# Patient Record
Sex: Male | Born: 1935 | Race: White | Hispanic: No | Marital: Single | Smoking: Former smoker
Health system: Southern US, Community
[De-identification: ages and names within clinical notes are randomized; demographics above are authoritative.]

## PROBLEM LIST (undated history)

## (undated) DIAGNOSIS — K591 Functional diarrhea: Secondary | ICD-10-CM

## (undated) DIAGNOSIS — I739 Peripheral vascular disease, unspecified: Secondary | ICD-10-CM

## (undated) DIAGNOSIS — E785 Hyperlipidemia, unspecified: Secondary | ICD-10-CM

## (undated) DIAGNOSIS — B070 Plantar wart: Secondary | ICD-10-CM

## (undated) DIAGNOSIS — F0390 Unspecified dementia without behavioral disturbance: Secondary | ICD-10-CM

## (undated) DIAGNOSIS — D649 Anemia, unspecified: Secondary | ICD-10-CM

## (undated) DIAGNOSIS — R51 Headache: Secondary | ICD-10-CM

## (undated) DIAGNOSIS — I714 Abdominal aortic aneurysm, without rupture, unspecified: Secondary | ICD-10-CM

## (undated) DIAGNOSIS — K649 Unspecified hemorrhoids: Secondary | ICD-10-CM

## (undated) DIAGNOSIS — I10 Essential (primary) hypertension: Secondary | ICD-10-CM

## (undated) DIAGNOSIS — R413 Other amnesia: Secondary | ICD-10-CM

## (undated) DIAGNOSIS — G629 Polyneuropathy, unspecified: Secondary | ICD-10-CM

## (undated) DIAGNOSIS — R1900 Intra-abdominal and pelvic swelling, mass and lump, unspecified site: Secondary | ICD-10-CM

## (undated) HISTORY — DX: Peripheral vascular disease, unspecified: I73.9

## (undated) HISTORY — DX: Hyperlipidemia, unspecified: E78.5

## (undated) HISTORY — DX: Plantar wart: B07.0

## (undated) HISTORY — DX: Polyneuropathy, unspecified: G62.9

## (undated) HISTORY — DX: Functional diarrhea: K59.1

## (undated) HISTORY — DX: Abdominal aortic aneurysm, without rupture, unspecified: I71.40

## (undated) HISTORY — PX: EYE SURGERY: SHX253

## (undated) HISTORY — DX: Abdominal aortic aneurysm, without rupture: I71.4

## (undated) HISTORY — DX: Anemia, unspecified: D64.9

## (undated) HISTORY — DX: Intra-abdominal and pelvic swelling, mass and lump, unspecified site: R19.00

## (undated) HISTORY — PX: CATARACT EXTRACTION W/ INTRAOCULAR LENS  IMPLANT, BILATERAL: SHX1307

## (undated) HISTORY — DX: Other amnesia: R41.3

## (undated) HISTORY — DX: Unspecified hemorrhoids: K64.9

## (undated) HISTORY — DX: Essential (primary) hypertension: I10

---

## 1968-10-27 HISTORY — PX: HEMORRHOID SURGERY: SHX153

## 2008-10-14 ENCOUNTER — Ambulatory Visit: Payer: Self-pay | Admitting: *Deleted

## 2010-07-11 NOTE — Consult Note (Signed)
VASCULAR SURGERY CONSULTATION   Alan Meyers, Alan Meyers  DOB:  [birth year]                                       10/14/2008  ZOXWR#:[id number]   REFERRING PHYSICIAN:  Fara Chute, MD.   REFERRAL DIAGNOSIS:  Bilateral leg pain.   HISTORY:  The patient is a 75 year old male with a long history of  bilateral leg pain.  He dates this back many years to having bilateral  heel discomfort.  He was seen by a podiatrist several years ago and had  an injection for his heel pain which did not resolve it significantly.   Over the past year he has now developed a new onset of discomfort,  primarily in his right calf.  He notes discomfort and tightness in his  right calf after walking approximately 10 minutes.  He rests, this goes  away and he is able to walk again for a similar distance.  He has no  pain at rest.  He denies night pain or nonhealing ulceration.  No  history of recent trauma to his lower extremities.   He does have chronic edema in his left lower extremity with a history of  varicosities.   Risk factors for peripheral vascular disease include a remote history of  tobacco use, hyperlipidemia and hypertension.   PAST MEDICAL HISTORY:  1. Hypertension.  2. Hyperlipidemia.   MEDICATIONS:  1. Ziac 5 mg daily.  2. Multivitamin 1 tablet daily.   SOCIAL HISTORY:  The patient is single, has four grown children.  He is  retired.  Does not smoke or use alcohol products.  Discontinued tobacco  use approximately 6 years ago.   FAMILY HISTORY:  Mother deceased age 77 of age related complications or  factors.  Father deceased age 98.  He has one brother with emphysema.  No family history of diabetes, premature heart disease or stroke.   REVIEW OF SYSTEMS:  The patient notes primarily pain in his legs without  other complaints.  Refer to patient encounter form.   PHYSICAL EXAM:  A 75 year old male hard of hearing.  No acute distress.  Alert and oriented.  Vital signs:  BP  172/81, pulse 60 per minute,  respirations 18 per minute.  Cardiovascular:  No carotid bruits.  Normal  heart sounds without murmurs.  No gallops or rubs.  Regular rate and  rhythm.  Chest:  Equal air entry bilaterally without rales or rhonchi.  Abdomen:  Soft, nontender.  No masses or organomegaly.  Normal bowel  sounds without bruits.  Lower extremities:  2+ femoral pulses  bilaterally.  Absent right popliteal, posterior tibial and dorsalis  pedis pulses.  Absent left popliteal, 1+ left dorsalis pedis, absent  left posterior tibial.  Left leg reveals one to two plus edema with  varicosities.  Mild pigmentation.  No ulceration.  Right leg reveals no  edema.  No ulceration evident.   IMPRESSION:  1. Mild to moderately disabling right lower extremity claudication.  2. Mild chronic venous insufficiency left lower extremity.  3. Hypertension.  4. Hyperlipidemia.   RECOMMENDATIONS:  I have discussed with the patient my findings.  He  does have mild to moderate right lower extremity claudication.  This is  affecting his lifestyle on a minimal basis.  I have recommended  conservative treatment and continued exercise.  Rest when he feels the  pain and continue  to walk following this.  Should he develop any  worsening symptoms, night pain, rest pain or nonhealing ulceration he  will contact the office.   Balinda Quails, M.D.  Electronically Signed  PGH/MEDQ  D:  10/14/2008  T:  10/15/2008  Job:  [id number]

## 2012-05-29 DIAGNOSIS — B070 Plantar wart: Secondary | ICD-10-CM

## 2012-05-29 HISTORY — DX: Plantar wart: B07.0

## 2012-08-01 DIAGNOSIS — K591 Functional diarrhea: Secondary | ICD-10-CM

## 2012-08-01 DIAGNOSIS — R1900 Intra-abdominal and pelvic swelling, mass and lump, unspecified site: Secondary | ICD-10-CM

## 2012-08-01 HISTORY — DX: Intra-abdominal and pelvic swelling, mass and lump, unspecified site: R19.00

## 2012-08-01 HISTORY — DX: Functional diarrhea: K59.1

## 2012-08-05 ENCOUNTER — Encounter: Payer: Self-pay | Admitting: Vascular Surgery

## 2012-08-06 ENCOUNTER — Ambulatory Visit (INDEPENDENT_AMBULATORY_CARE_PROVIDER_SITE_OTHER): Payer: Medicare Other | Admitting: Vascular Surgery

## 2012-08-06 ENCOUNTER — Encounter: Payer: Self-pay | Admitting: *Deleted

## 2012-08-06 ENCOUNTER — Other Ambulatory Visit: Payer: Self-pay | Admitting: *Deleted

## 2012-08-06 ENCOUNTER — Encounter: Payer: Self-pay | Admitting: Vascular Surgery

## 2012-08-06 VITALS — BP 149/83 | HR 67 | Resp 16 | Ht 70.0 in | Wt 154.0 lb

## 2012-08-06 DIAGNOSIS — I714 Abdominal aortic aneurysm, without rupture, unspecified: Secondary | ICD-10-CM

## 2012-08-06 DIAGNOSIS — Z01810 Encounter for preprocedural cardiovascular examination: Secondary | ICD-10-CM

## 2012-08-06 NOTE — Addendum Note (Signed)
Addended by: Adria Dill L on: 08/06/2012 03:38 PM   Modules accepted: Orders

## 2012-08-06 NOTE — Assessment & Plan Note (Signed)
The patient has a 6.1 cm abdominal aortic aneurysm. I've explained the risk of rupture for an aneurysm of this size is 5-10% per year. This reason I have recommended elective repair of his aneurysm. We have discussed the options of endovascular repair versus open repair and the advantages and disadvantages of each. In order to determine if he is a candidate for endovascular repair, we'll complete his workup with CT of the abdomen and pelvis and also arteriography. The etiology of his diarrhea still not clear, and will need to be sure that this is not an issue before proceeding with elective repair. In addition we'll arrange for preoperative cardiac stress test. I've also instructed him to begin taking 81 mg of aspirin per day as this has been shown to decrease his risk of myocardial infarction stroke. In addition I have recommended that he begin taking a low dose statin as this has been shown to lower mortality in patients with vascular disease. However, he would like to discuss this with his primary care physician before beginning this. We will make recommendations for elective repair his aneurysm once his workup is complete.

## 2012-08-06 NOTE — Addendum Note (Signed)
Addended by: Adria Dill L on: 08/06/2012 04:41 PM   Modules accepted: Orders

## 2012-08-06 NOTE — Progress Notes (Signed)
[facility]ist of [facility]  Patient name: Alan Meyers MRN: [id number] DOB: 1936/02/18 Sex: male  REASON FOR CONSULT: 6.1 cm abdominal aortic aneurysm. Referred by Dr.Steven Burdine.  HPI: Alan Meyers is a 77 y.o. male who was found to have a 6.1 cm abdominal aortic aneurysm by ultrasound. He had been having diarrhea off and on for 2 weeks. On exam, Dr. Leandrew Koyanagi noted a pulsatile abdominal mass he was sent for ultrasound which showed a 6.1 cm infrarenal abdominal aortic aneurysm. He denies any history of abdominal pain or back pain. He does still have some intermittent diarrhea. The etiology of this is not clear. He has not been on antibiotics. He denies any postprandial abdominal pain or weight loss.  Past Medical History  Diagnosis Date  . AAA (abdominal aortic aneurysm)     6.1 - 6.2cm  . Hemorrhoids   . Hypertension   . Hyperlipidemia   . Peripheral neuropathy   . Diarrhea, functional 08/01/12  . Peripheral arterial disease   . Plantar warts 05/29/12  . Pulsatile abdominal mass 08/01/12   Family History  Problem Relation Age of Onset  . Tuberculosis Mother   . Heart disease Father     Valvular heart disease and Congestive heart failure  . Heart attack Father   . Cancer Sister   . COPD Brother   . COPD Brother    For VQI Use Only  PRE-ADM LIVING: [facility] ] Home, [ ]  Nursing home, [ ]  Homeless  AMB STATUS: [facility].Keller ] Walking, [ ]  Walking w/ Assistance, [ ]  Wheelchair, [ ] Bed ridden  RECENT HEART ATTACK (<6 mon): No  CAD Sx: Arly.Keller ] No, [ ]  Asx, h/o MI, [ ]  Stable angina, [ ]  Unstable angina  PRIOR CHF: Arly.Keller ] No, [ ]  Asx, [ ]  Mild, [ ]  Moderate, [ ]  Severe  STRESS TEST: Arly.Keller ] No- will order, [ ]  Normal, [ ]  + ischemia, [ ]  + MI, [ ]  Both  SOCIAL HISTORY: History  Substance Use Topics  . Smoking status: Former Smoker    Types: Cigarettes    Quit date: 11/04/2010  . Smokeless tobacco: Never Used  . Alcohol Use: No   Not on File  Current Outpatient Prescriptions   Medication Sig Dispense Refill  . bismuth subsalicylate (PEPTO BISMOL) 262 MG/15ML suspension Take 15 mLs by mouth every 6 (six) hours as needed for indigestion.      Marland Kitchen KLOR-CON M20 20 MEQ tablet       . loperamide (IMODIUM) 2 MG capsule Take 2 mg by mouth 4 (four) times daily as needed for diarrhea or loose stools.       No current facility-administered medications for this visit.   REVIEW OF SYSTEMS: [facility] ] denotes positive finding; [  ] denotes negative finding  CARDIOVASCULAR:  [ ]  chest pain   [ ]  chest pressure   [ ]  palpitations   [ ]  orthopnea   [ ]  dyspnea on exertion   [ ]  claudication   [ ]  rest pain   [ ]  DVT   [ ]  phlebitis PULMONARY:   [ ]  productive cough   [ ]  asthma   [ ]  wheezing NEUROLOGIC:   [ ]  weakness  [ ]  paresthesias  [ ]  aphasia  [ ]  amaurosis  [ ]  dizziness HEMATOLOGIC:   [ ]  bleeding problems   [ ]  clotting disorders MUSCULOSKELETAL:  [ ]  joint pain   [ ]  joint swelling [ ]  leg swelling GASTROINTESTINAL: [ ]   blood in stool  [ ]   hematemesis GENITOURINARY:  [ ]   dysuria  [ ]   hematuria PSYCHIATRIC:  [ ]  history of major depression INTEGUMENTARY:  [ ]  rashes  [ ]  ulcers CONSTITUTIONAL:  [ ]  fever   [ ]  chills  PHYSICAL EXAM: Filed Vitals:   08/06/12 1038  BP: 149/83  Pulse: 67  Resp: 16  Height: 5\' 10"  (1.778 m)  Weight: 154 lb (69.854 kg)  SpO2: 100%   Body mass index is 22.1 kg/(m^2). GENERAL: The patient is a well-nourished male, in no acute distress. The vital signs are documented above. CARDIOVASCULAR: There is a regular rate and rhythm. He has a left carotid bruit. He has palpable femoral pulses. Both feet are warm and well-perfused although I cannot palpate pedal pulses. I cannot palpate popliteal pulses. PULMONARY: There is good air exchange bilaterally without wheezing or rales. ABDOMEN: Soft and non-tender with normal pitched bowel sounds. His aneurysm is palpable and nontender. MUSCULOSKELETAL: There are no major deformities or  cyanosis. NEUROLOGIC: No focal weakness or paresthesias are detected. SKIN: There are no ulcers or rashes noted. PSYCHIATRIC: The patient has a normal affect.  DATA:  I have reviewed his records from the referring physician's office as described above.  I have reviewed his ultrasound which shows a 6.1 cm distal abdominal aortic aneurysm with some dilatation of the proximal common iliac arteries.  MEDICAL ISSUES:  AAA (abdominal aortic aneurysm) without rupture-6.1 cm The patient has a 6.1 cm abdominal aortic aneurysm. I've explained the risk of rupture for an aneurysm of this size is 5-10% per year. This reason I have recommended elective repair of his aneurysm. We have discussed the options of endovascular repair versus open repair and the advantages and disadvantages of each. In order to determine if he is a candidate for endovascular repair, we'll complete his workup with CT of the abdomen and pelvis and also arteriography. The etiology of his diarrhea still not clear, and will need to be sure that this is not an issue before proceeding with elective repair. In addition we'll arrange for preoperative cardiac stress test. I've also instructed him to begin taking 81 mg of aspirin per day as this has been shown to decrease his risk of myocardial infarction stroke. In addition I have recommended that he begin taking a low dose statin as this has been shown to lower mortality in patients with vascular disease. However, he would like to discuss this with his primary care physician before beginning this. We will make recommendations for elective repair his aneurysm once his workup is complete.    Lion Fernandez S [facility] [place] [facility]: [phone]

## 2012-08-07 ENCOUNTER — Encounter: Payer: Self-pay | Admitting: Family Medicine

## 2012-08-07 ENCOUNTER — Encounter (HOSPITAL_COMMUNITY): Payer: Self-pay | Admitting: Pharmacy Technician

## 2012-08-07 NOTE — Addendum Note (Signed)
Addended by: Sharee Pimple on: 08/07/2012 09:32 AM   Modules accepted: Orders

## 2012-08-11 ENCOUNTER — Encounter (HOSPITAL_COMMUNITY)
Admission: RE | Disposition: A | Discharge status: Home / Self Care | Payer: Self-pay | Source: Ambulatory Visit | Attending: Vascular Surgery

## 2012-08-11 ENCOUNTER — Ambulatory Visit (HOSPITAL_COMMUNITY)
Admission: RE | Admit: 2012-08-11 | Discharge: 2012-08-11 | Disposition: A | Discharge status: Home / Self Care | Payer: Medicare Other | Source: Ambulatory Visit | Attending: Vascular Surgery | Admitting: Vascular Surgery

## 2012-08-11 DIAGNOSIS — I739 Peripheral vascular disease, unspecified: Secondary | ICD-10-CM | POA: Insufficient documentation

## 2012-08-11 DIAGNOSIS — I714 Abdominal aortic aneurysm, without rupture, unspecified: Secondary | ICD-10-CM

## 2012-08-11 DIAGNOSIS — I70209 Unspecified atherosclerosis of native arteries of extremities, unspecified extremity: Secondary | ICD-10-CM | POA: Insufficient documentation

## 2012-08-11 DIAGNOSIS — G609 Hereditary and idiopathic neuropathy, unspecified: Secondary | ICD-10-CM | POA: Insufficient documentation

## 2012-08-11 DIAGNOSIS — I10 Essential (primary) hypertension: Secondary | ICD-10-CM | POA: Insufficient documentation

## 2012-08-11 DIAGNOSIS — Z79899 Other long term (current) drug therapy: Secondary | ICD-10-CM | POA: Insufficient documentation

## 2012-08-11 DIAGNOSIS — K591 Functional diarrhea: Secondary | ICD-10-CM | POA: Insufficient documentation

## 2012-08-11 DIAGNOSIS — I701 Atherosclerosis of renal artery: Secondary | ICD-10-CM | POA: Insufficient documentation

## 2012-08-11 DIAGNOSIS — Z87891 Personal history of nicotine dependence: Secondary | ICD-10-CM | POA: Insufficient documentation

## 2012-08-11 DIAGNOSIS — Z8249 Family history of ischemic heart disease and other diseases of the circulatory system: Secondary | ICD-10-CM | POA: Insufficient documentation

## 2012-08-11 DIAGNOSIS — I7092 Chronic total occlusion of artery of the extremities: Secondary | ICD-10-CM | POA: Insufficient documentation

## 2012-08-11 DIAGNOSIS — E785 Hyperlipidemia, unspecified: Secondary | ICD-10-CM | POA: Insufficient documentation

## 2012-08-11 DIAGNOSIS — I7409 Other arterial embolism and thrombosis of abdominal aorta: Secondary | ICD-10-CM | POA: Insufficient documentation

## 2012-08-11 HISTORY — PX: ABDOMINAL AORTAGRAM: SHX5454

## 2012-08-11 LAB — POCT I-STAT, CHEM 8
Calcium, Ion: 1.13 mmol/L (ref 1.13–1.30)
Creatinine, Ser: 1 mg/dL (ref 0.50–1.35)
Glucose, Bld: 92 mg/dL (ref 70–99)
HCT: 36 % — ABNORMAL LOW (ref 39.0–52.0)
Hemoglobin: 12.2 g/dL — ABNORMAL LOW (ref 13.0–17.0)
TCO2: 26 mmol/L (ref 0–100)

## 2012-08-11 SURGERY — ABDOMINAL AORTAGRAM
Anesthesia: LOCAL

## 2012-08-11 MED ORDER — MIDAZOLAM HCL 2 MG/2ML IJ SOLN
INTRAMUSCULAR | Status: AC
  Filled 2012-08-11: qty 2

## 2012-08-11 MED ORDER — SODIUM CHLORIDE 0.9 % IV SOLN: INTRAVENOUS | Status: DC

## 2012-08-11 MED ORDER — FENTANYL CITRATE 0.05 MG/ML IJ SOLN
INTRAMUSCULAR | Status: AC
  Filled 2012-08-11: qty 2

## 2012-08-11 MED ORDER — ACETAMINOPHEN 325 MG PO TABS: 650.0000 mg | ORAL_TABLET | ORAL | Status: DC | PRN

## 2012-08-11 MED ORDER — HEPARIN (PORCINE) IN NACL 2-0.9 UNIT/ML-% IJ SOLN
INTRAMUSCULAR | Status: AC
  Filled 2012-08-11: qty 500

## 2012-08-11 MED ORDER — LIDOCAINE HCL (PF) 1 % IJ SOLN
INTRAMUSCULAR | Status: AC
  Filled 2012-08-11: qty 30

## 2012-08-11 MED ORDER — ONDANSETRON HCL 4 MG/2ML IJ SOLN: 4.0000 mg | Freq: Four times a day (QID) | INTRAMUSCULAR | Status: DC | PRN

## 2012-08-11 MED ORDER — SODIUM CHLORIDE 0.9 % IV SOLN: 1.0000 mL/kg/h | INTRAVENOUS | Status: DC

## 2012-08-11 NOTE — H&P (View-Only) (Signed)
[facility]ascular and Vein Specialist of [place]  Patient name: Alan Meyers MRN: [id number] DOB: 04/03/1935 Sex: male  REASON FOR CONSULT: 6.1 cm abdominal aortic aneurysm. Referred by Dr.Steven Burdine.  HPI: Alan Meyers is a 76 y.o. male who was found to have a 6.1 cm abdominal aortic aneurysm by ultrasound. He had been having diarrhea off and on for 2 weeks. On exam, Dr. Burdine noted a pulsatile abdominal mass he was sent for ultrasound which showed a 6.1 cm infrarenal abdominal aortic aneurysm. He denies any history of abdominal pain or back pain. He does still have some intermittent diarrhea. The etiology of this is not clear. He has not been on antibiotics. He denies any postprandial abdominal pain or weight loss.  Past Medical History  Diagnosis Date  . AAA (abdominal aortic aneurysm)     6.1 - 6.2cm  . Hemorrhoids   . Hypertension   . Hyperlipidemia   . Peripheral neuropathy   . Diarrhea, functional 08/01/12  . Peripheral arterial disease   . Plantar warts 05/29/12  . Pulsatile abdominal mass 08/01/12   Family History  Problem Relation Age of Onset  . Tuberculosis Mother   . Heart disease Father     Valvular heart disease and Congestive heart failure  . Heart attack Father   . Cancer Sister   . COPD Brother   . COPD Brother    For VQI Use Only  PRE-ADM LIVING: [X ] Home, [ ] Nursing home, [ ] Homeless  AMB STATUS: [X ] Walking, [ ] Walking w/ Assistance, [ ] Wheelchair, [ ]Bed ridden  RECENT HEART ATTACK (<6 mon): No  CAD Sx: [X ] No, [ ] Asx, h/o MI, [ ] Stable angina, [ ] Unstable angina  PRIOR CHF: [X ] No, [ ] Asx, [ ] Mild, [ ] Moderate, [ ] Severe  STRESS TEST: [X ] No- will order, [ ] Normal, [ ] + ischemia, [ ] + MI, [ ] Both  SOCIAL HISTORY: History  Substance Use Topics  . Smoking status: Former Smoker    Types: Cigarettes    Quit date: 11/04/2010  . Smokeless tobacco: Never Used  . Alcohol Use: No   Not on File  Current Outpatient Prescriptions   Medication Sig Dispense Refill  . bismuth subsalicylate (PEPTO BISMOL) 262 MG/15ML suspension Take 15 mLs by mouth every 6 (six) hours as needed for indigestion.      . KLOR-CON M20 20 MEQ tablet       . loperamide (IMODIUM) 2 MG capsule Take 2 mg by mouth 4 (four) times daily as needed for diarrhea or loose stools.       No current facility-administered medications for this visit.   REVIEW OF SYSTEMS: [X ] denotes positive finding; [  ] denotes negative finding  CARDIOVASCULAR:  [ ] chest pain   [ ] chest pressure   [ ] palpitations   [ ] orthopnea   [ ] dyspnea on exertion   [ ] claudication   [ ] rest pain   [ ] DVT   [ ] phlebitis PULMONARY:   [ ] productive cough   [ ] asthma   [ ] wheezing NEUROLOGIC:   [ ] weakness  [ ] paresthesias  [ ] aphasia  [ ] amaurosis  [ ] dizziness HEMATOLOGIC:   [ ] bleeding problems   [ ] clotting disorders MUSCULOSKELETAL:  [ ] joint pain   [ ] joint swelling [ ] leg swelling GASTROINTESTINAL: [ ]    blood in stool  [ ]  hematemesis GENITOURINARY:  [ ]  dysuria  [ ]  hematuria PSYCHIATRIC:  [ ] history of major depression INTEGUMENTARY:  [ ] rashes  [ ] ulcers CONSTITUTIONAL:  [ ] fever   [ ] chills  PHYSICAL EXAM: Filed Vitals:   08/06/12 1038  BP: 149/83  Pulse: 67  Resp: 16  Height: 5' 10" (1.778 m)  Weight: 154 lb (69.854 kg)  SpO2: 100%   Body mass index is 22.1 kg/(m^2). GENERAL: The patient is a well-nourished male, in no acute distress. The vital signs are documented above. CARDIOVASCULAR: There is a regular rate and rhythm. He has a left carotid bruit. He has palpable femoral pulses. Both feet are warm and well-perfused although I cannot palpate pedal pulses. I cannot palpate popliteal pulses. PULMONARY: There is good air exchange bilaterally without wheezing or rales. ABDOMEN: Soft and non-tender with normal pitched bowel sounds. His aneurysm is palpable and nontender. MUSCULOSKELETAL: There are no major deformities or  cyanosis. NEUROLOGIC: No focal weakness or paresthesias are detected. SKIN: There are no ulcers or rashes noted. PSYCHIATRIC: The patient has a normal affect.  DATA:  I have reviewed his records from the referring physician's office as described above.  I have reviewed his ultrasound which shows a 6.1 cm distal abdominal aortic aneurysm with some dilatation of the proximal common iliac arteries.  MEDICAL ISSUES:  AAA (abdominal aortic aneurysm) without rupture-6.1 cm The patient has a 6.1 cm abdominal aortic aneurysm. I've explained the risk of rupture for an aneurysm of this size is 5-10% per year. This reason I have recommended elective repair of his aneurysm. We have discussed the options of endovascular repair versus open repair and the advantages and disadvantages of each. In order to determine if he is a candidate for endovascular repair, we'll complete his workup with CT of the abdomen and pelvis and also arteriography. The etiology of his diarrhea still not clear, and will need to be sure that this is not an issue before proceeding with elective repair. In addition we'll arrange for preoperative cardiac stress test. I've also instructed him to begin taking 81 mg of aspirin per day as this has been shown to decrease his risk of myocardial infarction stroke. In addition I have recommended that he begin taking a low dose statin as this has been shown to lower mortality in patients with vascular disease. However, he would like to discuss this with his primary care physician before beginning this. We will make recommendations for elective repair his aneurysm once his workup is complete.    Nija Koopman S [facility] [place] [facility]: [phone]    

## 2012-08-11 NOTE — Interval H&P Note (Signed)
History and Physical Interval Note:  08/11/2012 10:48 AM  Alan Meyers  has presented today for surgery, with the diagnosis of AAA  The various methods of treatment have been discussed with the patient and family. After consideration of risks, benefits and other options for treatment, the patient has consented to  Procedure(s): ABDOMINAL AORTAGRAM (N/A) as a surgical intervention .  The patient's history has been reviewed, patient examined, no change in status, stable for surgery.  I have reviewed the patient's chart and labs.  Questions were answered to the patient's satisfaction.     DICKSON,CHRISTOPHER S

## 2012-08-11 NOTE — Op Note (Signed)
PATIENT: Alan Meyers   MRN: [id number] DOB: [birth year]    DATE OF PROCEDURE: 08/11/2012  INDICATIONS: Jayan Raymundo is a 77 y.o. male Who was found to have a 6.1 cm infrarenal abdominal aortic aneurysm. He is brought in for diagnostic arteriography.  PROCEDURE:  1. Ultrasound-guided access to the right common femoral artery 2. Aortogram with bilateral iliac arteriogram and bilateral lower extremity runoff  SURGEON: Di Kindle. Edilia Bo, MD, FACS  ANESTHESIA: local with sedation   EBL: minimal  TECHNIQUE: The patient was brought to the peripheral vascular lab and sedated with 1 mg of Versed and 50 mcg of fentanyl. Both groins were prepped and draped in usual sterile fashion. Under ultrasound guidance, after the skin was anesthetized, the right common femoral artery was cannulated and a guidewire introduced into the infrarenal aorta under fluoroscopic control. A 5 French sheath was introduced over the wire. The pigtail catheter was positioned at the L1 vertebral body and flush aortogram obtained. The catheter was positioned above the celiac axis and a lateral arteriogram was obtained. The catheter was in position above the aortic bifurcation and oblique iliac projections were obtained. Next bilateral lower extremity runoff films were obtained. The catheter was then removed over a wire. The patient was transferred to the holding area for removal of the sheath. No immediate complications were noted.  FINDINGS:  1. There is a moderate 70% left renal artery stenosis which is markedly calcified. The right renal artery is patent. There is an accessory right renal artery below the main right renal artery on the right. There is a large fusiform abdominal aortic aneurysm with significant laminated thrombus. 2. The celiac axis and superior mesenteric artery are widely patent. 3. The common iliac arteries and extra with iliac arteries are patent bilaterally. The hypogastric arteries are patent with moderate  disease. 4. On the right side, there is a eccentric plaque in the common femoral artery medially it does not produce significant stenosis. The deep femoral artery on the right is patent. The superficial femoral artery on the right is occluded proximally with reconstitution of the popliteal artery at the level of the knee. This two-vessel runoff on the right via the anterior tibial and peroneal arteries. The right posterior tibial artery is occluded. 5. On the left side, the common femoral and deep femoral artery are patent. There is moderate diffuse disease the superficial femoral artery at the adductor canal. Popped artery is patent. The tibial peroneal trunk and peroneal arteries are patent. The left anterior tibial and posterior tibial arteries are occluded.  [name], MD, FACS [facility]  DATE OF DICTATION:   08/11/2012

## 2012-08-13 ENCOUNTER — Encounter (HOSPITAL_COMMUNITY): Payer: Medicare Other

## 2012-08-18 ENCOUNTER — Telehealth: Payer: Self-pay

## 2012-08-18 ENCOUNTER — Ambulatory Visit (HOSPITAL_COMMUNITY): Payer: Medicare Other | Attending: Cardiovascular Disease | Admitting: Radiology

## 2012-08-18 ENCOUNTER — Other Ambulatory Visit: Payer: Self-pay | Admitting: Vascular Surgery

## 2012-08-18 VITALS — BP 142/78 | Ht 70.0 in | Wt 150.0 lb

## 2012-08-18 DIAGNOSIS — Z8249 Family history of ischemic heart disease and other diseases of the circulatory system: Secondary | ICD-10-CM | POA: Insufficient documentation

## 2012-08-18 DIAGNOSIS — Z87891 Personal history of nicotine dependence: Secondary | ICD-10-CM | POA: Insufficient documentation

## 2012-08-18 DIAGNOSIS — I739 Peripheral vascular disease, unspecified: Secondary | ICD-10-CM | POA: Insufficient documentation

## 2012-08-18 DIAGNOSIS — I714 Abdominal aortic aneurysm, without rupture, unspecified: Secondary | ICD-10-CM

## 2012-08-18 DIAGNOSIS — R0989 Other specified symptoms and signs involving the circulatory and respiratory systems: Secondary | ICD-10-CM | POA: Insufficient documentation

## 2012-08-18 DIAGNOSIS — I10 Essential (primary) hypertension: Secondary | ICD-10-CM | POA: Insufficient documentation

## 2012-08-18 DIAGNOSIS — R0602 Shortness of breath: Secondary | ICD-10-CM

## 2012-08-18 DIAGNOSIS — R0609 Other forms of dyspnea: Secondary | ICD-10-CM | POA: Insufficient documentation

## 2012-08-18 DIAGNOSIS — Z01810 Encounter for preprocedural cardiovascular examination: Secondary | ICD-10-CM

## 2012-08-18 LAB — BUN: BUN: 11 mg/dL (ref 6–23)

## 2012-08-18 LAB — CREATININE, SERUM: Creat: 1 mg/dL (ref 0.50–1.35)

## 2012-08-18 MED ORDER — TECHNETIUM TC 99M SESTAMIBI GENERIC - CARDIOLITE
10.0000 | Freq: Once | INTRAVENOUS | Status: AC | PRN
  Administered 2012-08-18: 10 via INTRAVENOUS

## 2012-08-18 MED ORDER — REGADENOSON 0.4 MG/5ML IV SOLN
0.4000 mg | Freq: Once | INTRAVENOUS | Status: AC
  Administered 2012-08-18: 0.4 mg via INTRAVENOUS

## 2012-08-18 MED ORDER — TECHNETIUM TC 99M SESTAMIBI GENERIC - CARDIOLITE
30.0000 | Freq: Once | INTRAVENOUS | Status: AC | PRN
  Administered 2012-08-18: 30 via INTRAVENOUS

## 2012-08-18 NOTE — Telephone Encounter (Signed)
Rec'd message from Dr. Edilia Bo, in response to message from pt's. Daughter.  Called pt's. daughter, Gunnar Fusi and informed that Dr. Edilia Bo doesn't feel pt's diarrhea is related to his AAA, but would see pt. earlier than 7/16, if CTA and office appt. can be made sooner.  Advised daughter will contact her re: possible new appt. time.

## 2012-08-18 NOTE — Telephone Encounter (Addendum)
Message copied by Phillips Odor on Mon Aug 18, 2012  4:07 PM ------      Message from: Chuck Hint      Created: Mon Aug 18, 2012 11:25 AM      Regarding: RE: question       I do not think that his diarrhea is related to his AAA. I would be happy to see him sooner if his CT scan can be done sooner.             CD      ----- Message -----         From: Erenest Blank, RN         Sent: 08/15/2012   5:12 PM           To: Chuck Hint, MD      Subject: question                                                 This pt's. daughter called to question if the pt's diarrhea is related to his aneurysm; his CTA and f/u appt. is scheduled 09/10/12.  She says the patient is "miserable".  She is requesting his appt. be moved forward.  Your schedules are full, and you are gone on 7/9.  Do you want me to bring him in on an earlier date?       ------

## 2012-08-18 NOTE — Progress Notes (Signed)
[facility] SITE 3 NUCLEAR MED [address] [place], [place] [address] [phone]    Cardiology Nuclear Med Study  Charbel Los is a 77 y.o. male     MRN : [id number]     DOB: 1935/10/12  Procedure Date: 08/18/2012  Nuclear Med Background Indication for Stress Test:  Evaluation for Ischemia and Pending Surgical Clearance: AAA repair with Dr. [name] History:  no previous cardiac history Cardiac Risk Factors: Family History - CAD, History of Smoking, Hypertension, Lipids and PVD  Symptoms:  DOE   Nuclear Pre-Procedure Caffeine/Decaff Intake:  None NPO After: 6:00am   Lungs:  clear O2 Sat: 98% on room air. IV 0.9% NS with Angio Cath:  22g  IV Site: R Wrist  IV Started by:  Stanton Kidney, EMT-P  Chest Size (in):  40 Cup Size: n/a  Height: 5\' 10"  (1.778 m)  Weight:  150 lb (68.04 kg)  BMI:  Body mass index is 21.52 kg/(m^2). Tech Comments:  NA    Nuclear Med Study 1 or 2 day study: 1 day  Stress Test Type:  Lexiscan  Reading MD: Charlton Haws, MD  Order Authorizing Provider:  Carmela Rima  Resting Radionuclide: Technetium 42m Sestamibi  Resting Radionuclide Dose: 11.0 mCi   Stress Radionuclide:  Technetium 88m Sestamibi  Stress Radionuclide Dose: 32.8 mCi           Stress Protocol Rest HR: 64 Stress HR: 90  Rest BP: 142/78 Stress BP: 141/80  Exercise Time (min): n/a METS: n/a   Predicted Max HR: 146 bpm % Max HR: 61.64 bpm Rate Pressure Product: 21308   Dose of Adenosine (mg):  n/a Dose of Lexiscan: 0.4 mg  Dose of Atropine (mg): n/a Dose of Dobutamine: n/a mcg/kg/min (at max HR)  Stress Test Technologist: Cathlyn Parsons, RN  Nuclear Technologist:  Domenic Polite, CNMT     Rest Procedure:  Myocardial perfusion imaging was performed at rest 45 minutes following the intravenous administration of Technetium 31m Sestamibi. Rest ECG: NSR - Normal EKG  Stress Procedure:  The patient received IV Lexiscan 0.4 mg over 15-seconds.   Technetium 57m Sestamibi injected at 30-seconds.  Quantitative spect images were obtained after a 45 minute delay. Stress ECG: No significant change from baseline ECG  QPS Raw Data Images:  Patient motion noted. Stress Images:  There is decreased uptake in the inferior wall. Rest Images:  Normal homogeneous uptake in all areas of the myocardium. Subtraction (SDS):  SDS 5 abnormal in inferior base Transient Ischemic Dilatation (Normal <1.22):  1.05 Lung/Heart Ratio (Normal <0.45):  0.16  Quantitative Gated Spect Images QGS EDV:  130 ml QGS ESV:  64 ml  Impression Exercise Capacity:  Lexiscan with no exercise. BP Response:  Normal blood pressure response. Clinical Symptoms:  Weak feeling in stomach ECG Impression:  No significant ST segment change suggestive of ischemia. Comparison with Prior Nuclear Study: No images to compare  Overall Impression:  Low risk stress nuclear study Small area of inferobasal wall ischemia.  LV Ejection Fraction: 51%.  LV Wall Motion:  Low normal EF ? inferobasal wall hypokinesis  Charlton Haws

## 2012-08-19 NOTE — Telephone Encounter (Signed)
I spoke with Alan Meyers to inform of appt change to 08/27/12. I also sent Debbie a new staff message to get Prior Auth for CTA that day, dpm

## 2012-08-26 ENCOUNTER — Encounter: Payer: Self-pay | Admitting: Vascular Surgery

## 2012-08-27 ENCOUNTER — Other Ambulatory Visit: Payer: Medicare Other

## 2012-08-27 ENCOUNTER — Other Ambulatory Visit (INDEPENDENT_AMBULATORY_CARE_PROVIDER_SITE_OTHER): Payer: Medicare Other | Admitting: *Deleted

## 2012-08-27 ENCOUNTER — Ambulatory Visit (INDEPENDENT_AMBULATORY_CARE_PROVIDER_SITE_OTHER): Payer: Medicare Other | Admitting: Vascular Surgery

## 2012-08-27 ENCOUNTER — Ambulatory Visit
Admission: RE | Admit: 2012-08-27 | Discharge: 2012-08-27 | Disposition: A | Discharge status: Home / Self Care | Payer: Medicare Other | Source: Ambulatory Visit | Attending: Vascular Surgery | Admitting: Vascular Surgery

## 2012-08-27 ENCOUNTER — Encounter: Payer: Self-pay | Admitting: Vascular Surgery

## 2012-08-27 VITALS — BP 137/72 | HR 67 | Resp 18 | Ht 70.5 in | Wt 146.8 lb

## 2012-08-27 DIAGNOSIS — I714 Abdominal aortic aneurysm, without rupture, unspecified: Secondary | ICD-10-CM

## 2012-08-27 DIAGNOSIS — I6529 Occlusion and stenosis of unspecified carotid artery: Secondary | ICD-10-CM

## 2012-08-27 DIAGNOSIS — R0989 Other specified symptoms and signs involving the circulatory and respiratory systems: Secondary | ICD-10-CM

## 2012-08-27 MED ORDER — IOHEXOL 350 MG/ML SOLN
80.0000 mL | Freq: Once | INTRAVENOUS | Status: AC | PRN
  Administered 2012-08-27: 80 mL via INTRAVENOUS

## 2012-08-27 NOTE — Assessment & Plan Note (Signed)
By CT scan the abdominal aortic aneurysm is greater than 6 cm. I think the risk of rupture is approximately 10% per year. For that reason I have recommended elective repair. Based on his CT scan and angiogram he appears to be a reasonable candidate for EVAR. In addition he has an asymptomatic greater than 80% left carotid stenosis. I think the aneurysm is a more pressing issue and I would recommend proceeding with EVAR and then subsequently staged left carotid endarterectomy. He would be heparinized during his EVAR and I think he would be fairly low risk for perioperative stroke although this is certainly a risk. I have discussed the indications for aneurysm repair. I have explained that without repair, the risk of the aneurysm rupturing is 5-10% per year. We have discussed the advantages and disadvantages of open versus endovascular repair. The patient wishes to proceed with endovascular aneurysm repair (EVAR). I have discussed the potential complications of EVAR, including, but not limited to: bleeding, infection, arterial injury, graft migration, endoleak, renal failure, MI or other unpredictable medical problems. We have discussed the possibility of having to convert to open repair. We also discussed the need for continued lifelong follow-up after EVAR. All of the patients questions were answered and they are agreeable to proceed with surgery. Note, I did discuss with him  The evidence to show a lower mortality with patient's on a statin. I've recommended that I write him a prescription for this however he feels very strongly this point about not taking a statin and feels that there is too much risk with this. I have asked him to begin taking an aspirin a day and he seems agreeable to this. We'll try to arrange his EVAR in the next 2 weeks.

## 2012-08-27 NOTE — Assessment & Plan Note (Signed)
Patient has an asymptomatic greater than 80% left carotid stenosis. (He has a less than 40% right carotid stenosis). I have recommended we proceed with EVAR and one seed recovered from this to proceed with a staged left carotid endarterectomy. I think the large aneurysm is more pressing in that his risk of perioperative stroke is fairly low. He does know to begin taking his aspirin a day which I have previously ask him to do. Also discussed with him the importance of taking a statin however he feels strongly that he does not want to as he feels that there is significant risk in taking statins. I have tried to alleviate these fears but have not been successful.

## 2012-08-27 NOTE — Progress Notes (Signed)
[facility] [place]  Patient name: Fadil Matney MRN: [id number] DOB: 02/15/1936 Sex: male  REASON FOR VISIT: follow up of abdominal aortic aneurysm  HPI: Haston Mastro is a 76 y.o. male who was being worked up for diarrhea. Workup included an ultrasound that the patient had a pulsatile abdominal mass. This showed a 6.1 cm infrarenal abdominal aortic aneurysm. I saw him in consultation on 08/06/2012. Denies any abdominal or back pain. He does state that she's had alternating constipation and diarrhea for some time and has undergone an extensive workup for this which has been unremarkable. He had a CT angiogram and comes in to discuss these results and consider elective repair of his aneurysm.  In addition, I detected a left carotid bruit and therefore he had a carotid duplex scan today also. He denies any history of stroke, TIAs, expressive or receptive aphasia, or amaurosis fugax. He has not been on aspirin and I have previously asked him to begin taking aspirin.  Past Medical History  Diagnosis Date  . AAA (abdominal aortic aneurysm)     6.1 - 6.2cm  . Hemorrhoids   . Hypertension   . Hyperlipidemia   . Peripheral neuropathy   . Diarrhea, functional 08/01/12  . Peripheral arterial disease   . Plantar warts 05/29/12  . Pulsatile abdominal mass 08/01/12   Family History  Problem Relation Age of Onset  . Tuberculosis Mother   . Heart disease Father     Valvular heart disease and Congestive heart failure  . Heart attack Father   . Cancer Sister   . COPD Brother   . COPD Brother    SOCIAL HISTORY: History  Substance Use Topics  . Smoking status: Former Smoker    Types: Cigarettes    Quit date: 02/27/2003  . Smokeless tobacco: Never Used  . Alcohol Use: No   No Known Allergies  Current Outpatient Prescriptions  Medication Sig Dispense Refill  . loperamide (IMODIUM) 2 MG capsule Take 2 mg by mouth 4 (four) times daily as needed for diarrhea or loose stools.       . bismuth subsalicylate (PEPTO BISMOL) 262 MG/15ML suspension Take 15 mLs by mouth every 6 (six) hours as needed for indigestion.      . KLOR-CON M20 20 MEQ tablet Take 20 mEq by mouth daily.        No current facility-administered medications for this visit.   REVIEW OF SYSTEMS: [X ] denotes positive finding; [  ] denotes negative finding  CARDIOVASCULAR:  [ ] chest pain   [ ] chest pressure   [ ] palpitations   [ ] orthopnea   [ ] dyspnea on exertion   [ ] claudication   [ ] rest pain   [ ] DVT   [ ] phlebitis PULMONARY:   [ ] productive cough   [ ] asthma   [ ] wheezing NEUROLOGIC:   [ ] weakness  [ ] paresthesias  [ ] aphasia  [ ] amaurosis  [ ] dizziness HEMATOLOGIC:   [ ] bleeding problems   [ ] clotting disorders MUSCULOSKELETAL:  [ ] joint pain   [ ] joint swelling [ ] leg swelling GASTROINTESTINAL: [ ]  blood in stool  [ ]  hematemesis GENITOURINARY:  [ ]  dysuria  [ ]  hematuria PSYCHIATRIC:  [ ] history of major depression INTEGUMENTARY:  [ ] rashes  [ ] ulcers CONSTITUTIONAL:  [ ] fever   [ ] chills  PHYSICAL   EXAM: Filed Vitals:   08/27/12 1343  BP: 137/72  Pulse: 67  Resp: 18  Height: 5' 10.5" (1.791 m)  Weight: 146 lb 12.8 oz (66.588 kg)   Body mass index is 20.76 kg/(m^2). GENERAL: The patient is a well-nourished male, in no acute distress. The vital signs are documented above. CARDIOVASCULAR: There is a regular rate and rhythm. Has a left carotid bruit. He has palpable femoral pulses. PULMONARY: There is good air exchange bilaterally without wheezing or rales. ABDOMEN: Soft and non-tender with normal pitched bowel sounds. Abdomen is palpable and nontender. MUSCULOSKELETAL: There are no major deformities or cyanosis. NEUROLOGIC: No focal weakness or paresthesias are detected. SKIN: There are no ulcers or rashes noted. PSYCHIATRIC: The patient has a normal affect.  DATA:  I reviewed his arteriogram which was performed on 08/11/2012. As a moderate 70% left renal  artery stenosis which is markedly calcified. The right renal artery is patent. He does have a small sensory renal artery on the right. The celiac axis and superior mesenteric artery are widely patent. Common iliac and external iliac arteries are patent bilaterally. The hypogastric arteries had moderate disease but are patent. On the right side he does have some eccentric plaque in the common femoral artery which does not produce a significant stenosis. The deep femoral artery and the right is patent. The superficial femoral artery the right is occluded proximally with reconstitution of the popliteal artery at the level of the knee. This two-vessel runoff via the anterior tibial and peroneal arteries. Right posterior tibial artery is occluded. On the left side the common femoral and deep femoral artery patent. There is moderate diffuse disease of the superficial femoral artery at the adductor canal. The popliteal artery is patent. The tibial peroneal trunk and peroneal arteries are patent. The left anterior tibial and posterior tibial arteries are occluded.  I have independently interpreted his carotid duplex scan which shows a greater than 80% left carotid stenosis with a less than 40% right carotid stenosis. Both vertebral arteries are patent with antegrade flow.  His CT scan which shows the maximum diameter of his aneurysm is 6.8 cm. 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  His nuclear medicine study shows a low risk stress nuclear study with a small area of inferobasilar wall ischemia. Ejection fraction was 51%.  MEDICAL ISSUES:  AAA (abdominal aortic aneurysm) without rupture-6.1 cm By CT scan the abdominal aortic aneurysm is greater than 6 cm. I think the risk of rupture is approximately 10% per year. For that reason I have recommended elective repair. Based on his CT scan and angiogram he appears to be a reasonable candidate for EVAR. In addition he has an  asymptomatic greater than 80% left carotid stenosis. I think the aneurysm is a more pressing issue and I would recommend proceeding with EVAR and then subsequently staged left carotid endarterectomy. He would be heparinized during his EVAR and I think he would be fairly low risk for perioperative stroke although this is certainly a risk. I have discussed the indications for aneurysm repair. I have explained that without repair, the risk of the aneurysm rupturing is 5-10% per year. We have discussed the advantages and disadvantages of open versus endovascular repair. The patient wishes to proceed with endovascular aneurysm repair (EVAR). I have discussed the potential complications of EVAR, including, but not limited to: bleeding, infection, arterial injury, graft migration, endoleak, renal failure, MI or other unpredictable medical problems. We have discussed the   possibility of having to convert to open repair. We also discussed the need for continued lifelong follow-up after EVAR. All of the patients questions were answered and they are agreeable to proceed with surgery. Note, I did discuss with him  The evidence to show a lower mortality with patient's on a statin. I've recommended that I write him a prescription for this however he feels very strongly this point about not taking a statin and feels that there is too much risk with this. I have asked him to begin taking an aspirin a day and he seems agreeable to this. We'll try to arrange his EVAR in the next 2 weeks.    Occlusion and stenosis of carotid artery without mention of cerebral infarction Patient has an asymptomatic greater than 80% left carotid stenosis. (He has a less than 40% right carotid stenosis). I have recommended we proceed with EVAR and one seed recovered from this to proceed with a staged left carotid endarterectomy. I think the large aneurysm is more pressing in that his risk of perioperative stroke is fairly low. He does know to begin  taking his aspirin a day which I have previously ask him to do. Also discussed with him the importance of taking a statin however he feels strongly that he does not want to as he feels that there is significant risk in taking statins. I have tried to alleviate these fears but have not been successful.   DICKSON,CHRISTOPHER S [facility] [place] Beeper: [phone]    

## 2012-09-08 ENCOUNTER — Encounter: Payer: Self-pay | Admitting: Vascular Surgery

## 2012-09-09 ENCOUNTER — Encounter (HOSPITAL_COMMUNITY): Payer: Self-pay | Admitting: Pharmacy Technician

## 2012-09-09 ENCOUNTER — Other Ambulatory Visit: Payer: Self-pay

## 2012-09-10 ENCOUNTER — Ambulatory Visit: Payer: Medicare Other | Admitting: Vascular Surgery

## 2012-09-10 ENCOUNTER — Other Ambulatory Visit: Payer: Medicare Other

## 2012-09-12 ENCOUNTER — Ambulatory Visit (HOSPITAL_COMMUNITY)
Admission: RE | Admit: 2012-09-12 | Discharge: 2012-09-12 | Disposition: A | Discharge status: Home / Self Care | Payer: Medicare Other | Source: Ambulatory Visit | Attending: Anesthesiology | Admitting: Anesthesiology

## 2012-09-12 ENCOUNTER — Encounter (HOSPITAL_COMMUNITY): Payer: Self-pay

## 2012-09-12 ENCOUNTER — Encounter (HOSPITAL_COMMUNITY)
Admission: RE | Admit: 2012-09-12 | Discharge: 2012-09-12 | Disposition: A | Discharge status: Home / Self Care | Payer: Medicare Other | Source: Ambulatory Visit | Attending: Anesthesiology | Admitting: Anesthesiology

## 2012-09-12 DIAGNOSIS — I714 Abdominal aortic aneurysm, without rupture, unspecified: Secondary | ICD-10-CM | POA: Insufficient documentation

## 2012-09-12 DIAGNOSIS — Z01818 Encounter for other preprocedural examination: Secondary | ICD-10-CM | POA: Insufficient documentation

## 2012-09-12 DIAGNOSIS — Z87891 Personal history of nicotine dependence: Secondary | ICD-10-CM | POA: Insufficient documentation

## 2012-09-12 DIAGNOSIS — Z01812 Encounter for preprocedural laboratory examination: Secondary | ICD-10-CM | POA: Insufficient documentation

## 2012-09-12 DIAGNOSIS — I10 Essential (primary) hypertension: Secondary | ICD-10-CM | POA: Insufficient documentation

## 2012-09-12 DIAGNOSIS — Z0183 Encounter for blood typing: Secondary | ICD-10-CM | POA: Insufficient documentation

## 2012-09-12 LAB — BLOOD GAS, ARTERIAL
Acid-Base Excess: 0.5 mmol/L (ref 0.0–2.0)
Bicarbonate: 24 mEq/L (ref 20.0–24.0)
O2 Saturation: 97.9 %
pO2, Arterial: 93.5 mmHg (ref 80.0–100.0)

## 2012-09-12 LAB — CBC
Hemoglobin: 13 g/dL (ref 13.0–17.0)
MCH: 29.6 pg (ref 26.0–34.0)
MCV: 86.6 fL (ref 78.0–100.0)
RBC: 4.39 MIL/uL (ref 4.22–5.81)
WBC: 6.8 10*3/uL (ref 4.0–10.5)

## 2012-09-12 LAB — COMPREHENSIVE METABOLIC PANEL
ALT: 16 U/L (ref 0–53)
CO2: 23 mEq/L (ref 19–32)
Calcium: 8.9 mg/dL (ref 8.4–10.5)
Chloride: 105 mEq/L (ref 96–112)
GFR calc Af Amer: >90 mL/min (ref >90)
GFR calc non Af Amer: 84 mL/min — ABNORMAL LOW (ref >90)
Glucose, Bld: 100 mg/dL — ABNORMAL HIGH (ref 70–99)
Sodium: 138 mEq/L (ref 135–145)
Total Bilirubin: 0.6 mg/dL (ref 0.3–1.2)

## 2012-09-12 LAB — URINALYSIS, ROUTINE W REFLEX MICROSCOPIC
Bilirubin Urine: NEGATIVE
Glucose, UA: NEGATIVE mg/dL
Ketones, ur: NEGATIVE mg/dL
Protein, ur: NEGATIVE mg/dL
pH: 7.5 (ref 5.0–8.0)

## 2012-09-12 LAB — TYPE AND SCREEN: ABO/RH(D): A POS

## 2012-09-12 NOTE — Pre-Procedure Instructions (Addendum)
Teondre Jarosz  09/12/2012   Your procedure is scheduled on:  7.24.14  Report to [facility] at530 AM.  Call this number if you have problems the morning of surgery: [phone]   Remember:   Do not eat food or drink liquids after midnight.   Take these medicines the morning of surgery with A SIP OF WATER: none   Do not wear jewelry, make-up or nail polish.  Do not wear lotions, powders, or perfumes. You may wear deodorant.  Do not shave 48 hours prior to surgery. Men may shave face and neck.  Do not bring valuables to the hospital.  [facility] is not responsible                   for any belongings or valuables.  Contacts, dentures or bridgework may not be worn into surgery.  Leave suitcase in the car. After surgery it may be brought to your room.  For patients admitted to the hospital, checkout time is 11:00 AM the day of  discharge.   Patients discharged the day of surgery will not be allowed to drive  home.  Name and phone number of your driver:   Special Instructions: Shower using CHG 2 nights before surgery and the night before surgery.  If you shower the day of surgery use CHG.  Use special wash - you have one bottle of CHG for all showers.  You should use approximately 1/3 of the bottle for each shower.   Please read over the following fact sheets that you were given: Pain Booklet, Coughing and Deep Breathing, Blood Transfusion Information, MRSA Information and Surgical Site Infection Prevention

## 2012-09-17 MED ORDER — SODIUM CHLORIDE 0.9 % IV SOLN: INTRAVENOUS | Status: DC

## 2012-09-17 MED ORDER — DEXTROSE 5 % IV SOLN
1.5000 g | INTRAVENOUS | Status: AC
  Administered 2012-09-18: 1.5 g via INTRAVENOUS
  Filled 2012-09-17: qty 1.5

## 2012-09-18 ENCOUNTER — Encounter (HOSPITAL_COMMUNITY): Payer: Self-pay | Admitting: *Deleted

## 2012-09-18 ENCOUNTER — Other Ambulatory Visit: Payer: Self-pay

## 2012-09-18 ENCOUNTER — Encounter (HOSPITAL_COMMUNITY): Payer: Self-pay | Admitting: Anesthesiology

## 2012-09-18 ENCOUNTER — Other Ambulatory Visit: Payer: Self-pay | Admitting: *Deleted

## 2012-09-18 ENCOUNTER — Inpatient Hospital Stay (HOSPITAL_COMMUNITY): Payer: Medicare Other

## 2012-09-18 ENCOUNTER — Telehealth: Payer: Self-pay | Admitting: Vascular Surgery

## 2012-09-18 ENCOUNTER — Inpatient Hospital Stay (HOSPITAL_COMMUNITY): Payer: Medicare Other | Admitting: Anesthesiology

## 2012-09-18 ENCOUNTER — Encounter (HOSPITAL_COMMUNITY)
Admission: RE | Disposition: A | Discharge status: Home / Self Care | Payer: Self-pay | Source: Ambulatory Visit | Attending: Vascular Surgery

## 2012-09-18 ENCOUNTER — Inpatient Hospital Stay (HOSPITAL_COMMUNITY)
Admission: RE | Admit: 2012-09-18 | Discharge: 2012-09-19 | DRG: 238 | Disposition: A | Discharge status: Home / Self Care | Payer: Medicare Other | Source: Ambulatory Visit | Attending: Vascular Surgery | Admitting: Vascular Surgery

## 2012-09-18 DIAGNOSIS — Z79899 Other long term (current) drug therapy: Secondary | ICD-10-CM

## 2012-09-18 DIAGNOSIS — I714 Abdominal aortic aneurysm, without rupture, unspecified: Secondary | ICD-10-CM

## 2012-09-18 DIAGNOSIS — Z7982 Long term (current) use of aspirin: Secondary | ICD-10-CM

## 2012-09-18 DIAGNOSIS — G609 Hereditary and idiopathic neuropathy, unspecified: Secondary | ICD-10-CM | POA: Diagnosis present

## 2012-09-18 DIAGNOSIS — I10 Essential (primary) hypertension: Secondary | ICD-10-CM | POA: Diagnosis present

## 2012-09-18 DIAGNOSIS — E785 Hyperlipidemia, unspecified: Secondary | ICD-10-CM | POA: Diagnosis present

## 2012-09-18 DIAGNOSIS — Z8249 Family history of ischemic heart disease and other diseases of the circulatory system: Secondary | ICD-10-CM

## 2012-09-18 DIAGNOSIS — Z836 Family history of other diseases of the respiratory system: Secondary | ICD-10-CM

## 2012-09-18 DIAGNOSIS — Z48812 Encounter for surgical aftercare following surgery on the circulatory system: Secondary | ICD-10-CM

## 2012-09-18 DIAGNOSIS — I6529 Occlusion and stenosis of unspecified carotid artery: Secondary | ICD-10-CM | POA: Diagnosis present

## 2012-09-18 DIAGNOSIS — Z87891 Personal history of nicotine dependence: Secondary | ICD-10-CM

## 2012-09-18 HISTORY — PX: ABDOMINAL AORTIC ENDOVASCULAR STENT GRAFT: SHX5707

## 2012-09-18 LAB — BASIC METABOLIC PANEL
Calcium: 8.7 mg/dL (ref 8.4–10.5)
GFR calc Af Amer: >90 mL/min (ref >90)
GFR calc non Af Amer: 81 mL/min — ABNORMAL LOW (ref >90)
Glucose, Bld: 93 mg/dL (ref 70–99)
Sodium: 140 mEq/L (ref 135–145)

## 2012-09-18 LAB — CBC
Hemoglobin: 11.6 g/dL — ABNORMAL LOW (ref 13.0–17.0)
MCH: 30 pg (ref 26.0–34.0)
MCHC: 34.5 g/dL (ref 30.0–36.0)
Platelets: 123 10*3/uL — ABNORMAL LOW (ref 150–400)
RDW: 13.6 % (ref 11.5–15.5)

## 2012-09-18 LAB — PROTIME-INR: INR: 1.06 (ref 0.00–1.49)

## 2012-09-18 SURGERY — INSERTION, ENDOVASCULAR STENT GRAFT, AORTA, ABDOMINAL
Anesthesia: General | Site: Abdomen | Wound class: Clean

## 2012-09-18 MED ORDER — HEPARIN SODIUM (PORCINE) 1000 UNIT/ML IJ SOLN
INTRAMUSCULAR | Status: DC | PRN
  Administered 2012-09-18: 6500 [IU] via INTRAVENOUS

## 2012-09-18 MED ORDER — DOCUSATE SODIUM 100 MG PO CAPS: 100.0000 mg | ORAL_CAPSULE | Freq: Every day | ORAL | Status: DC

## 2012-09-18 MED ORDER — OXYCODONE HCL 5 MG PO TABS: 5.0000 mg | ORAL_TABLET | Freq: Once | ORAL | Status: DC | PRN

## 2012-09-18 MED ORDER — LACTATED RINGERS IV SOLN
INTRAVENOUS | Status: DC
  Administered 2012-09-18: 07:00:00 via INTRAVENOUS

## 2012-09-18 MED ORDER — ASPIRIN 81 MG PO CHEW: 81.0000 mg | CHEWABLE_TABLET | Freq: Every day | ORAL | Status: DC

## 2012-09-18 MED ORDER — ROCURONIUM BROMIDE 100 MG/10ML IV SOLN
INTRAVENOUS | Status: DC | PRN
  Administered 2012-09-18: 50 mg via INTRAVENOUS
  Administered 2012-09-18: 10 mg via INTRAVENOUS

## 2012-09-18 MED ORDER — OXYCODONE-ACETAMINOPHEN 5-325 MG PO TABS: 1.0000 | ORAL_TABLET | ORAL | Status: DC | PRN

## 2012-09-18 MED ORDER — PROTAMINE SULFATE 10 MG/ML IV SOLN
INTRAVENOUS | Status: DC | PRN
  Administered 2012-09-18 (×3): 10 mg via INTRAVENOUS

## 2012-09-18 MED ORDER — HYDRALAZINE HCL 20 MG/ML IJ SOLN: 10.0000 mg | INTRAMUSCULAR | Status: DC | PRN

## 2012-09-18 MED ORDER — PROPOFOL 10 MG/ML IV BOLUS
INTRAVENOUS | Status: DC | PRN
  Administered 2012-09-18: 150 mg via INTRAVENOUS

## 2012-09-18 MED ORDER — ONDANSETRON HCL 4 MG/2ML IJ SOLN: 4.0000 mg | Freq: Four times a day (QID) | INTRAMUSCULAR | Status: DC | PRN

## 2012-09-18 MED ORDER — POTASSIUM CHLORIDE CRYS ER 20 MEQ PO TBCR: 20.0000 meq | EXTENDED_RELEASE_TABLET | Freq: Every day | ORAL | Status: DC | PRN

## 2012-09-18 MED ORDER — LABETALOL HCL 5 MG/ML IV SOLN: 10.0000 mg | INTRAVENOUS | Status: DC | PRN

## 2012-09-18 MED ORDER — FENTANYL CITRATE 0.05 MG/ML IJ SOLN
INTRAMUSCULAR | Status: AC
  Filled 2012-09-18: qty 2

## 2012-09-18 MED ORDER — PHENYLEPHRINE HCL 10 MG/ML IJ SOLN
10.0000 mg | INTRAVENOUS | Status: DC | PRN
  Administered 2012-09-18: 10 ug/min via INTRAVENOUS

## 2012-09-18 MED ORDER — MIDAZOLAM HCL 5 MG/5ML IJ SOLN
INTRAMUSCULAR | Status: DC | PRN
  Administered 2012-09-18: 1 mg via INTRAVENOUS

## 2012-09-18 MED ORDER — DEXTROSE-NACL 5-0.9 % IV SOLN
INTRAVENOUS | Status: DC
  Administered 2012-09-18: 13:00:00 via INTRAVENOUS

## 2012-09-18 MED ORDER — LIDOCAINE HCL (CARDIAC) 20 MG/ML IV SOLN
INTRAVENOUS | Status: DC | PRN
  Administered 2012-09-18: 60 mg via INTRAVENOUS

## 2012-09-18 MED ORDER — ONDANSETRON HCL 4 MG/2ML IJ SOLN
INTRAMUSCULAR | Status: DC | PRN
  Administered 2012-09-18: 4 mg via INTRAVENOUS

## 2012-09-18 MED ORDER — ACETAMINOPHEN 325 MG PO TABS
325.0000 mg | ORAL_TABLET | ORAL | Status: DC | PRN
  Administered 2012-09-19: 650 mg via ORAL
  Filled 2012-09-18: qty 2

## 2012-09-18 MED ORDER — IODIXANOL 320 MG/ML IV SOLN
INTRAVENOUS | Status: DC | PRN
  Administered 2012-09-18: 150 mL via INTRAVENOUS

## 2012-09-18 MED ORDER — HYDROMORPHONE HCL PF 1 MG/ML IJ SOLN: 0.2500 mg | INTRAMUSCULAR | Status: DC | PRN

## 2012-09-18 MED ORDER — FENTANYL CITRATE 0.05 MG/ML IJ SOLN
50.0000 ug | INTRAMUSCULAR | Status: DC | PRN
  Administered 2012-09-18: 50 ug via INTRAVENOUS

## 2012-09-18 MED ORDER — SODIUM CHLORIDE 0.9 % IV SOLN: 500.0000 mL | Freq: Once | INTRAVENOUS | Status: AC | PRN

## 2012-09-18 MED ORDER — METOPROLOL TARTRATE 1 MG/ML IV SOLN: 2.0000 mg | INTRAVENOUS | Status: DC | PRN

## 2012-09-18 MED ORDER — ASPIRIN 81 MG PO TABS: 81.0000 mg | ORAL_TABLET | Freq: Every day | ORAL | Status: DC

## 2012-09-18 MED ORDER — ALUM & MAG HYDROXIDE-SIMETH 200-200-20 MG/5ML PO SUSP: 15.0000 mL | ORAL | Status: DC | PRN

## 2012-09-18 MED ORDER — PHENOL 1.4 % MT LIQD: 1.0000 | OROMUCOSAL | Status: DC | PRN

## 2012-09-18 MED ORDER — OXYCODONE HCL 5 MG/5ML PO SOLN: 5.0000 mg | Freq: Once | ORAL | Status: DC | PRN

## 2012-09-18 MED ORDER — ACETAMINOPHEN 650 MG RE SUPP: 325.0000 mg | RECTAL | Status: DC | PRN

## 2012-09-18 MED ORDER — SODIUM CHLORIDE 0.9 % IR SOLN
Status: DC | PRN
  Administered 2012-09-18: 10:00:00

## 2012-09-18 MED ORDER — NEOSTIGMINE METHYLSULFATE 1 MG/ML IJ SOLN
INTRAMUSCULAR | Status: DC | PRN
  Administered 2012-09-18: 3 mg via INTRAVENOUS

## 2012-09-18 MED ORDER — DEXTROSE 5 % IV SOLN
1.5000 g | Freq: Two times a day (BID) | INTRAVENOUS | Status: AC
  Administered 2012-09-18 – 2012-09-19 (×2): 1.5 g via INTRAVENOUS
  Filled 2012-09-18 (×2): qty 1.5

## 2012-09-18 MED ORDER — MAGNESIUM SULFATE 40 MG/ML IJ SOLN
2.0000 g | Freq: Once | INTRAMUSCULAR | Status: AC | PRN
  Filled 2012-09-18: qty 50

## 2012-09-18 MED ORDER — PANTOPRAZOLE SODIUM 40 MG PO TBEC
40.0000 mg | DELAYED_RELEASE_TABLET | Freq: Every day | ORAL | Status: DC
  Administered 2012-09-18: 40 mg via ORAL
  Filled 2012-09-18: qty 1

## 2012-09-18 MED ORDER — 0.9 % SODIUM CHLORIDE (POUR BTL) OPTIME
TOPICAL | Status: DC | PRN
  Administered 2012-09-18: 1000 mL

## 2012-09-18 MED ORDER — GUAIFENESIN-DM 100-10 MG/5ML PO SYRP: 15.0000 mL | ORAL_SOLUTION | ORAL | Status: DC | PRN

## 2012-09-18 MED ORDER — FENTANYL CITRATE 0.05 MG/ML IJ SOLN
INTRAMUSCULAR | Status: DC | PRN
  Administered 2012-09-18 (×2): 50 ug via INTRAVENOUS
  Administered 2012-09-18: 100 ug via INTRAVENOUS

## 2012-09-18 MED ORDER — LACTATED RINGERS IV SOLN
INTRAVENOUS | Status: DC | PRN
  Administered 2012-09-18 (×2): via INTRAVENOUS

## 2012-09-18 MED ORDER — GLYCOPYRROLATE 0.2 MG/ML IJ SOLN
INTRAMUSCULAR | Status: DC | PRN
  Administered 2012-09-18: 0.4 mg via INTRAVENOUS

## 2012-09-18 MED ORDER — HYDROMORPHONE HCL PF 1 MG/ML IJ SOLN
0.5000 mg | INTRAMUSCULAR | Status: DC | PRN
  Administered 2012-09-18: 0.5 mg via INTRAVENOUS
  Filled 2012-09-18: qty 1

## 2012-09-18 MED ORDER — ATORVASTATIN CALCIUM 10 MG PO TABS
10.0000 mg | ORAL_TABLET | Freq: Every day | ORAL | Status: DC
  Administered 2012-09-18: 10 mg via ORAL
  Filled 2012-09-18 (×2): qty 1

## 2012-09-18 SURGICAL SUPPLY — 68 items
BAG BANDED W/RUBBER/TAPE 36X54 (MISCELLANEOUS) ×2 IMPLANT
BAG SNAP BAND KOVER 36X36 (MISCELLANEOUS) ×6 IMPLANT
CANISTER SUCTION 2500CC (MISCELLANEOUS) ×2 IMPLANT
CATH BEACON 5.038 65CM KMP-01 (CATHETERS) ×2 IMPLANT
CATH OMNI FLUSH .035X70CM (CATHETERS) ×2 IMPLANT
CLIP TI MEDIUM 24 (CLIP) IMPLANT
CLIP TI WIDE RED SMALL 24 (CLIP) IMPLANT
CLOTH BEACON ORANGE TIMEOUT ST (SAFETY) ×2 IMPLANT
COVER DOME SNAP 22 D (MISCELLANEOUS) ×2 IMPLANT
COVER MAYO STAND STRL (DRAPES) ×2 IMPLANT
COVER PROBE W GEL 5X96 (DRAPES) ×2 IMPLANT
COVER SURGICAL LIGHT HANDLE (MISCELLANEOUS) ×2 IMPLANT
DERMABOND ADVANCED (GAUZE/BANDAGES/DRESSINGS) ×1
DERMABOND ADVANCED .7 DNX12 (GAUZE/BANDAGES/DRESSINGS) ×1 IMPLANT
DEVICE CLOSURE PERCLS PRGLD 6F (VASCULAR PRODUCTS) ×4 IMPLANT
DEVICE TORQUE H2O (MISCELLANEOUS) ×2 IMPLANT
DRAPE TABLE COVER HEAVY DUTY (DRAPES) ×2 IMPLANT
DRESSING OPSITE X SMALL 2X3 (GAUZE/BANDAGES/DRESSINGS) ×4 IMPLANT
ELECT CAUTERY BLADE 6.4 (BLADE) ×2 IMPLANT
ELECT REM PT RETURN 9FT ADLT (ELECTROSURGICAL) ×4
ELECTRODE REM PT RTRN 9FT ADLT (ELECTROSURGICAL) ×2 IMPLANT
EXCLUDER TRUNK (Endovascular Graft) ×2 IMPLANT
GAUZE SPONGE 2X2 8PLY STRL LF (GAUZE/BANDAGES/DRESSINGS) ×2 IMPLANT
GLOVE BIO SURGEON STRL SZ 6.5 (GLOVE) ×2 IMPLANT
GLOVE BIO SURGEON STRL SZ7 (GLOVE) ×2 IMPLANT
GLOVE BIO SURGEON STRL SZ7.5 (GLOVE) ×2 IMPLANT
GLOVE BIOGEL PI IND STRL 7.0 (GLOVE) ×1 IMPLANT
GLOVE BIOGEL PI IND STRL 7.5 (GLOVE) ×3 IMPLANT
GLOVE BIOGEL PI IND STRL 8 (GLOVE) ×1 IMPLANT
GLOVE BIOGEL PI INDICATOR 7.0 (GLOVE) ×1
GLOVE BIOGEL PI INDICATOR 7.5 (GLOVE) ×3
GLOVE BIOGEL PI INDICATOR 8 (GLOVE) ×1
GLOVE ECLIPSE 6.5 STRL STRAW (GLOVE) ×2 IMPLANT
GOWN STRL NON-REIN LRG LVL3 (GOWN DISPOSABLE) ×8 IMPLANT
GRAFT BALLN CATH 65CM (STENTS) ×1 IMPLANT
GRAFT EXCLUDER LEG 14.5X12 (Endovascular Graft) ×2 IMPLANT
GUIDEWIRE ANGLED .035X150CM (WIRE) ×2 IMPLANT
KIT BASIN OR (CUSTOM PROCEDURE TRAY) ×2 IMPLANT
KIT ROOM TURNOVER OR (KITS) ×2 IMPLANT
NEEDLE PERC 18GX7CM (NEEDLE) ×2 IMPLANT
NS IRRIG 1000ML POUR BTL (IV SOLUTION) ×2 IMPLANT
PACK AORTA (CUSTOM PROCEDURE TRAY) ×2 IMPLANT
PAD ARMBOARD 7.5X6 YLW CONV (MISCELLANEOUS) ×4 IMPLANT
PENCIL BUTTON HOLSTER BLD 10FT (ELECTRODE) IMPLANT
PERCLOSE PROGLIDE 6F (VASCULAR PRODUCTS) ×8
PROTECTION STATION PRESSURIZED (MISCELLANEOUS) ×2
SHEATH AVANTI 11CM 8FR (MISCELLANEOUS) ×2 IMPLANT
SHEATH BRITE TIP 8FR 23CM (MISCELLANEOUS) ×2 IMPLANT
SPONGE GAUZE 2X2 STER 10/PKG (GAUZE/BANDAGES/DRESSINGS) ×2
STAPLER VISISTAT 35W (STAPLE) IMPLANT
STATION PROTECTION PRESSURIZED (MISCELLANEOUS) ×1 IMPLANT
STENT GRAFT BALLN CATH 65CM (STENTS) ×1
STOPCOCK MORSE 400PSI 3WAY (MISCELLANEOUS) ×2 IMPLANT
SUT PROLENE 5 0 C 1 24 (SUTURE) IMPLANT
SUT VIC AB 2-0 CTB1 (SUTURE) IMPLANT
SUT VIC AB 3-0 SH 27 (SUTURE)
SUT VIC AB 3-0 SH 27X BRD (SUTURE) IMPLANT
SUT VICRYL 4-0 PS2 18IN ABS (SUTURE) ×4 IMPLANT
SYR 20CC LL (SYRINGE) ×4 IMPLANT
SYR 30ML LL (SYRINGE) ×2 IMPLANT
SYR MEDRAD MARK V 150ML (SYRINGE) ×2 IMPLANT
SYRINGE 10CC LL (SYRINGE) ×6 IMPLANT
TOWEL OR 17X24 6PK STRL BLUE (TOWEL DISPOSABLE) ×4 IMPLANT
TOWEL OR 17X26 10 PK STRL BLUE (TOWEL DISPOSABLE) ×2 IMPLANT
TRAY FOLEY CATH 14FRSI W/METER (CATHETERS) ×2 IMPLANT
TUBING HIGH PRESSURE 120CM (CONNECTOR) ×2 IMPLANT
WIRE AMPLATZ SS-J .035X180CM (WIRE) ×4 IMPLANT
WIRE BENTSON .035X145CM (WIRE) ×4 IMPLANT

## 2012-09-18 NOTE — H&P (View-Only) (Signed)
[facility] [place]  Patient name: Alan Meyers MRN: [id number] DOB: 10/25/1935 Sex: male  REASON FOR VISIT: follow up of abdominal aortic aneurysm  HPI: Alan Meyers is a 77 y.o. male who was being worked up for diarrhea. Workup included an ultrasound that the patient had a pulsatile abdominal mass. This showed a 6.1 cm infrarenal abdominal aortic aneurysm. I saw him in consultation on 08/06/2012. Denies any abdominal or back pain. He does state that she's had alternating constipation and diarrhea for some time and has undergone an extensive workup for this which has been unremarkable. He had a CT angiogram and comes in to discuss these results and consider elective repair of his aneurysm.  In addition, I detected a left carotid bruit and therefore he had a carotid duplex scan today also. He denies any history of stroke, TIAs, expressive or receptive aphasia, or amaurosis fugax. He has not been on aspirin and I have previously asked him to begin taking aspirin.  Past Medical History  Diagnosis Date  . AAA (abdominal aortic aneurysm)     6.1 - 6.2cm  . Hemorrhoids   . Hypertension   . Hyperlipidemia   . Peripheral neuropathy   . Diarrhea, functional 08/01/12  . Peripheral arterial disease   . Plantar warts 05/29/12  . Pulsatile abdominal mass 08/01/12   Family History  Problem Relation Age of Onset  . Tuberculosis Mother   . Heart disease Father     Valvular heart disease and Congestive heart failure  . Heart attack Father   . Cancer Sister   . COPD Brother   . COPD Brother    SOCIAL HISTORY: History  Substance Use Topics  . Smoking status: Former Smoker    Types: Cigarettes    Quit date: 02/27/2003  . Smokeless tobacco: Never Used  . Alcohol Use: No   No Known Allergies  Current Outpatient Prescriptions  Medication Sig Dispense Refill  . loperamide (IMODIUM) 2 MG capsule Take 2 mg by mouth 4 (four) times daily as needed for diarrhea or loose stools.       . bismuth subsalicylate (PEPTO BISMOL) 262 MG/15ML suspension Take 15 mLs by mouth every 6 (six) hours as needed for indigestion.      Marland Kitchen KLOR-CON M20 20 MEQ tablet Take 20 mEq by mouth daily.        No current facility-administered medications for this visit.   REVIEW OF SYSTEMS: Arly.Keller ] denotes positive finding; [  ] denotes negative finding  CARDIOVASCULAR:  [ ]  chest pain   [ ]  chest pressure   [ ]  palpitations   [ ]  orthopnea   [ ]  dyspnea on exertion   [ ]  claudication   [ ]  rest pain   [ ]  DVT   [ ]  phlebitis PULMONARY:   [ ]  productive cough   [ ]  asthma   [ ]  wheezing NEUROLOGIC:   [ ]  weakness  [ ]  paresthesias  [ ]  aphasia  [ ]  amaurosis  [ ]  dizziness HEMATOLOGIC:   [ ]  bleeding problems   [ ]  clotting disorders MUSCULOSKELETAL:  [ ]  joint pain   [ ]  joint swelling [ ]  leg swelling GASTROINTESTINAL: [ ]   blood in stool  [ ]   hematemesis GENITOURINARY:  [ ]   dysuria  [ ]   hematuria PSYCHIATRIC:  [ ]  history of major depression INTEGUMENTARY:  [ ]  rashes  [ ]  ulcers CONSTITUTIONAL:  [ ]  fever   [ ]  chills  PHYSICAL  EXAMCeasar Mons Vitals:   08/27/12 1343  BP: 137/72  Pulse: 67  Resp: 18  Height: 5' 10.5" (1.791 m)  Weight: 146 lb 12.8 oz (66.588 kg)   Body mass index is 20.76 kg/(m^2). GENERAL: The patient is a well-nourished male, in no acute distress. The vital signs are documented above. CARDIOVASCULAR: There is a regular rate and rhythm. Has a left carotid bruit. He has palpable femoral pulses. PULMONARY: There is good air exchange bilaterally without wheezing or rales. ABDOMEN: Soft and non-tender with normal pitched bowel sounds. Abdomen is palpable and nontender. MUSCULOSKELETAL: There are no major deformities or cyanosis. NEUROLOGIC: No focal weakness or paresthesias are detected. SKIN: There are no ulcers or rashes noted. PSYCHIATRIC: The patient has a normal affect.  DATA:  I reviewed his arteriogram which was performed on 08/11/2012. As a moderate 70% left renal  artery stenosis which is markedly calcified. The right renal artery is patent. He does have a small sensory renal artery on the right. The celiac axis and superior mesenteric artery are widely patent. Common iliac and external iliac arteries are patent bilaterally. The hypogastric arteries had moderate disease but are patent. On the right side he does have some eccentric plaque in the common femoral artery which does not produce a significant stenosis. The deep femoral artery and the right is patent. The superficial femoral artery the right is occluded proximally with reconstitution of the popliteal artery at the level of the knee. This two-vessel runoff via the anterior tibial and peroneal arteries. Right posterior tibial artery is occluded. On the left side the common femoral and deep femoral artery patent. There is moderate diffuse disease of the superficial femoral artery at the adductor canal. The popliteal artery is patent. The tibial peroneal trunk and peroneal arteries are patent. The left anterior tibial and posterior tibial arteries are occluded.  I have independently interpreted his carotid duplex scan which shows a greater than 80% left carotid stenosis with a less than 40% right carotid stenosis. Both vertebral arteries are patent with antegrade flow.  His CT scan which shows the maximum diameter of his aneurysm is 6.8 cm. 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  His nuclear medicine study shows a low risk stress nuclear study with a small area of inferobasilar wall ischemia. Ejection fraction was 51%.  MEDICAL ISSUES:  AAA (abdominal aortic aneurysm) without rupture-6.1 cm By CT scan the abdominal aortic aneurysm is greater than 6 cm. I think the risk of rupture is approximately 10% per year. For that reason I have recommended elective repair. Based on his CT scan and angiogram he appears to be a reasonable candidate for EVAR. In addition he has an  asymptomatic greater than 80% left carotid stenosis. I think the aneurysm is a more pressing issue and I would recommend proceeding with EVAR and then subsequently staged left carotid endarterectomy. He would be heparinized during his EVAR and I think he would be fairly low risk for perioperative stroke although this is certainly a risk. I have discussed the indications for aneurysm repair. I have explained that without repair, the risk of the aneurysm rupturing is 5-10% per year. We have discussed the advantages and disadvantages of open versus endovascular repair. The patient wishes to proceed with endovascular aneurysm repair (EVAR). I have discussed the potential complications of EVAR, including, but not limited to: bleeding, infection, arterial injury, graft migration, endoleak, renal failure, MI or other unpredictable medical problems. We have discussed the  possibility of having to convert to open repair. We also discussed the need for continued lifelong follow-up after EVAR. All of the patients questions were answered and they are agreeable to proceed with surgery. Note, I did discuss with him  The evidence to show a lower mortality with patient's on a statin. I've recommended that I write him a prescription for this however he feels very strongly this point about not taking a statin and feels that there is too much risk with this. I have asked him to begin taking an aspirin a day and he seems agreeable to this. We'll try to arrange his EVAR in the next 2 weeks.    Occlusion and stenosis of carotid artery without mention of cerebral infarction Patient has an asymptomatic greater than 80% left carotid stenosis. (He has a less than 40% right carotid stenosis). I have recommended we proceed with EVAR and one seed recovered from this to proceed with a staged left carotid endarterectomy. I think the large aneurysm is more pressing in that his risk of perioperative stroke is fairly low. He does know to begin  taking his aspirin a day which I have previously ask him to do. Also discussed with him the importance of taking a statin however he feels strongly that he does not want to as he feels that there is significant risk in taking statins. I have tried to alleviate these fears but have not been successful.   DICKSON,CHRISTOPHER S [facility] [place] Beeper: [phone]

## 2012-09-18 NOTE — Anesthesia Preprocedure Evaluation (Addendum)
Anesthesia Evaluation  Patient identified by MRN, date of birth, ID band Patient awake    Reviewed: Allergy & Precautions, H&P , NPO status , Patient's Chart, lab work & pertinent test results  Airway Mallampati: II  Neck ROM: full    Dental  (+) Edentulous Upper, Edentulous Lower and Dental Advisory Given   Pulmonary former smoker,  breath sounds clear to auscultation        Cardiovascular hypertension, + Peripheral Vascular Disease Rhythm:Regular Rate:Normal     Neuro/Psych  Neuromuscular disease negative psych ROS   GI/Hepatic negative GI ROS, Neg liver ROS,   Endo/Other  negative endocrine ROS  Renal/GU negative Renal ROS     Musculoskeletal negative musculoskeletal ROS (+)   Abdominal (+)  Abdomen: soft. Bowel sounds: normal.  Peds  Hematology negative hematology ROS (+)   Anesthesia Other Findings Greater than 80% left carotid stenosis.  Carlynn Herald, CRNA  Reproductive/Obstetrics negative OB ROS                       Anesthesia Physical Anesthesia Plan  ASA: III  Anesthesia Plan: General   Post-op Pain Management:    Induction: Intravenous  Airway Management Planned: Oral ETT  Additional Equipment: Arterial line  Intra-op Plan:   Post-operative Plan: Extubation in OR  Informed Consent: I have reviewed the patients History and Physical, chart, labs and discussed the procedure including the risks, benefits and alternatives for the proposed anesthesia with the patient or authorized representative who has indicated his/her understanding and acceptance.     Plan Discussed with: CRNA, Anesthesiologist and Surgeon  Anesthesia Plan Comments:         Anesthesia Quick Evaluation

## 2012-09-18 NOTE — Progress Notes (Signed)
@  2145 Pt endorsed waking up to severe L chest pain 9/10. Vitals assessed and WDL (see flowsheet). 0.5mg  Dilaudid administered, 12 Lead EKG completed (NSR), and MD notified (Dr. Edilia Bo) per protocol. No orders received. Pt now endorses pain 3/10 and intermittent. Will continue to monitor and assess.

## 2012-09-18 NOTE — Interval H&P Note (Signed)
History and Physical Interval Note:  09/18/2012 7:13 AM  Alan Meyers  has presented today for surgery, with the diagnosis of Abdominal Aortic Aneurysm  The various methods of treatment have been discussed with the patient and family. After consideration of risks, benefits and other options for treatment, the patient has consented to  Procedure(s): ABDOMINAL AORTIC ENDOVASCULAR STENT GRAFT (N/A) as a surgical intervention .  The patient's history has been reviewed, patient examined, no change in status, stable for surgery.  I have reviewed the patient's chart and labs.  Questions were answered to the patient's satisfaction.     Steadman Prosperi S

## 2012-09-18 NOTE — Telephone Encounter (Signed)
Message copied by Jena Gauss on Thu Sep 18, 2012 11:41 AM ------      Message from: Marlowe Shores      Created: Thu Sep 18, 2012  9:56 AM       4 week F/U EVAR with CTA ABD/Pelvis - Edilia Bo ------

## 2012-09-18 NOTE — Op Note (Signed)
NAME: Alan Meyers   MRN: [id number] DOB: [birth year]    DATE OF OPERATION: 09/18/2012  PREOP DIAGNOSIS: 6.6 cm infrarenal abdominal aortic aneurysm  POSTOP DIAGNOSIS: same  PROCEDURE:  1. Aortogram  2. Percutaneous endovascular aneurysm repair using the Gore Excluder device ( 2 components)  COSURGEONs: Di Kindle. Edilia Bo, MD, Leonides Sake, MD  ANESTHESIA: Gen.   EBL: minimal  INDICATIONS: Alan Meyers is a 77 y.o. male it was found to have a 6.6 cm infrarenal abdominal aortic aneurysm.  FINDINGS: completion arteriogram shows no evidence of endoleak with the graft in good position.  TECHNIQUE: The patient was taken to the operating room and received a general anesthetic. Both groins the abdomen were prepped and draped in usual sterile fashion. Under ultrasound guidance, the right common femoral artery was cannulated and a guidewire introduced. The track was dilated and then 2 Perclose devices were placed to pre-close the artery. The initial Perclose device was rotated 30 medially, and the second Perclose device was rotated 30 laterally. Next a short 8 [place] sheath was introduced. The [facility] wire was then exchanged for an Amplatz wire using the Omni flush catheter. We checked the position of the wire distally to be sure that it did not advance too far in and marked the wire on the back table.  Likewise on the left side the left common femoral artery was pre-closed using 2 Perclose devices as dictated by Dr. Imogene Burn. An 8 French sheath was introduced on the left. The 12 French sheath was then introduced over an Amplatz wire on the left. The patient was heparinized. On the right side, the 38 French sheath was introduced over the Amplatz wire and positioned at the bottom of the L2 vertebral body. The omni-flush catheter was positioned just above this. Aortogram was obtained to demonstrate the position of the renal arteries. Next the trunk ipsilateral component which was a 26 mm x 14 mm x 18 cm in  length device was advanced through the 18 French sheath on the right side with the contralateral gate angulated anteriorly and to the patient's right. The proximal graft was deployed below the level of the renal arteries preserving flow to both renal arteries and to the accessory renal artery. Next the contralateral gate was cannulated. As dictated separately by Dr. Imogene Burn. Once confirmation was obtained that the gate had been successfully cannulated by turning the Omni flush catheter within the graft, the Amplatz wire was introduced and then the contralateral limb was advanced through the 12 French sheath in the left groin and positioned in the contralateral gate. A retrograde left iliac arteriogram was obtained a 30 RAO projection in order to demonstrate position of the left hypogastric artery. The patient's right hypogastric artery was occluded. This was a 14 mm x 12 cm device. Next the ipsilateral limb was fully deployed.next the proximal graft and distal grafts in addition to the iliac limbs were ballooned without difficulty. Completion arteriograms obtained through 8 Omni Flush catheter and showed no evidence of endoleak. There was excellent position of the graft.  On the left side, the sheath was removed and the Perclose sutures tied and the wire removed. There was good hemostasis. On the right side, the sheath was removed, the Perclose devices were tied and there was good hemostasis after the wire was removed. Skin was closed with 4-0 subcuticular stitches. Bond was applied. The patient tolerated the procedure well was transferred to the recovery room stable condition. All needle and sponge counts were correct.  Alan Ferrari,  MD, FACS [facility]   DATE OF DICTATION:   09/18/2012

## 2012-09-18 NOTE — Transfer of Care (Signed)
Immediate Anesthesia Transfer of Care Note  Patient: Alan Meyers  Procedure(s) Performed: Procedure(s) with comments: ABDOMINAL AORTIC ENDOVASCULAR STENT GRAFT (N/A) - Ultrasound guided  Patient Location: PACU  Anesthesia Type:General  Level of Consciousness: awake, alert  and oriented  Airway & Oxygen Therapy: Patient connected to face mask oxygen  Post-op Assessment: Report given to PACU RN  Post vital signs: stable  Complications: No apparent anesthesia complications

## 2012-09-18 NOTE — Progress Notes (Signed)
VASCULAR PROGRESS NOTE  SUBJECTIVE: No complaints.  PHYSICAL EXAM: Filed Vitals:   09/18/12 1300 09/18/12 1400 09/18/12 1500 09/18/12 1600  BP: 140/66 153/67 133/65 114/60  Pulse: 57 62 72 77  Temp:      TempSrc:      Resp: 17 21 21 13   Height:      Weight:      SpO2: 97% 97% 94% 100%   Both groins look good.  ASSESSMENT AND PLAN:  Stable post op. Anticipate D/C in AM.  Cari Caraway Beeper: [phone] 09/18/2012

## 2012-09-18 NOTE — Anesthesia Postprocedure Evaluation (Signed)
Anesthesia Post Note  Patient: Alan Meyers  Procedure(s) Performed: Procedure(s) (LRB): ABDOMINAL AORTIC ENDOVASCULAR STENT GRAFT (N/A)  Anesthesia type: General  Patient location: PACU  Post pain: Pain level controlled and Adequate analgesia  Post assessment: Post-op Vital signs reviewed, Patient's Cardiovascular Status Stable, Respiratory Function Stable, Patent Airway and Pain level controlled  Last Vitals:  Filed Vitals:   09/18/12 1115  BP: 144/66  Pulse: 58  Temp:   Resp: 12    Post vital signs: Reviewed and stable  Level of consciousness: awake, alert  and oriented  Complications: No apparent anesthesia complications

## 2012-09-18 NOTE — Preoperative (Signed)
Beta Blockers   Reason not to administer Beta Blockers:Not Applicable 

## 2012-09-18 NOTE — Op Note (Signed)
OPERATIVE NOTE  PROCEDURE:  1. Bilateral common femoral artery cannulation under ultrasound guidance 2. "Preclose" repair bilateral common femoral artery  3. Placement of catheter in aorta x 2 4. Aortogram 5. Repair of aorta with modular bifurcated prosthesis with one limb (26 mm x 14.5 mm x 18 cm) 6. Placement of left limb extension : 14.5 mm x 12 cm 7. Radiology S&I  PRE-OPERATIVE DIAGNOSIS: abdominal aortic aneurysm   POST-OPERATIVE DIAGNOSIS: same as above   CO-SURGEONS: Cari Caraway, MD; Leonides Sake, MD   ANESTHESIA: general   ESTIMATED BLOOD LOSS: 50 cc   FINDING(S):  1. No endoleak leaks visualized 2. Bilateral renal arteries widely patent at end of case 3. Successful exclusion of abdominal aortic aneurysm   INDICATIONS:  Alan Meyers is 77 y.o. male with large abdominal aortic aneurysm. The patient is aware the risks of aortic surgery include but are not limited to: bleeding, need for transfusion, infection, death, stroke, paralysis, wound complications, impotence, bowel ischemia, extended ventilation and need for secondary procedures. Overall, Dr. Edilia Bo cited a mortality rate of 1-2% and morbidity rate of 15%.   DESCRIPTION:  After full informed written consent was obtained from the patient, he was brought back to the operating room, amd placed supine upon the operating table. He already had a A-line place. After obtaining adequate anesthesia, a Foley catheter was placed to monitor urine output in the operating room. He was prepped and draped in the standard fashion for either open aneurysm repair or endovascular aneurysm repair. Dr. Edilia Bo turned his attention to the left groin to start the "Preclose" technique. Under ultrasound guidance, he identified the right common femoral artery and made a stab incision over it. He dissected down to the artery in the groin with a hemostat and then cannulated it with a 18-gauge needle. He then passed a Benson wire up into the iliac  system. Initially, a 8-French dilator was loaded over the wire and used to dilate up this tract. The first ProGlide was placed and rotated medially. The second ProGlide was placed and rotated laterally. In such fashion, he completed the pre-close technique on this side and then replaced the [facility] wire up into the aorta. A short 8-Fr sheath was placed on this side. A pigtail catheter was loaded over the wire up to L2. The wire was exchanged for an Amplatz wire.  The pigtail catheter was removed.    At this point, I preformed the "Preclose" technique on the left common femoral artery, as previously described.  A long 8 Fr sheath was placed in the left common femoral artery.  At this point, Dr. Edilia Bo had the anesthesia team give 6500 units of Heparin intravenously.  I placed the pigtail catheter over the wire and steered into the suprarenal aorta.  The wire was exchanged for an Amplatz wire and the catheter removed.  The sheath was exchanged for a 12-Fr sheath.  I replaced the pigtail into the suprarenal aorta.  The catheter was connected to the power injector circuit after a de-airring and de-clotting maneuver.  Several aortogram were completed to fully visualize the renal artery positions: lowest is right renal accessory artery.    The right sheath was exchanged for a a 18-Fr Dryseal sheath.  Dr. Edilia Bo then delivered the main body device, which was a Gore C3 26-mm x 14.5-mm x 12-cm, in the expected infrarenal position.  The main body was then partial deployed by Dr. Edilia Bo.  At this point, I placed an Amplatz wire through the pigtail  catheter and left it there as a buddy wire.  I placed a Glidewire into the left dryseal sheath and then placed a KMP catheter over the wire.  Using these two, I eventually was able to cannulate the contralateral gate, which was in a cross limb configuration. I was then able to advance both of these through the graft.   I pulled out the Amplatz wire and then exchanged the  glidewire for the Amplatz wire.  I loaded the pigtail catheter over the wire into the graft.  I pulled back the wire to reform the pigtail catheter.  I rotated the catheter to verify cannulation of the contralateral gate.  I replaced the Amplatz into pigtail catheter.  I then pulled down the pigtail catheter down to the flow bifurcation and then pulled the sheath back on the left side and did a retrograde injection from the sheath demonstrating the level of the internal iliac artery. Then subsequently based on the measurements, a 14.5-mm x 12 cm contralateral limb was selected. The contralateral limb was then placed through this sheath and then the sheath was pulled back to appropriate location. The iliac extension was then deployed with adequate overlap. The stent delivery device was removed.   At this point, Dr. Edilia Bo fully deployed the main body, fully extending the partially constrained limb and also releasing the device from the delivery system. The delivery system was then removed. We then obtained a molding balloon which was used to mold the top, the flow divider and the ends of each iliac limb, and all overlapping segment. Dr. Edilia Bo placed a pigtail catheter over the right wire into the suprarenal position. The catheter was connected to the power injector circuit and a completion aortogram was completed.  On completion aortogram, no further endoleak was present with all renal arteries patent. The aneurysm was completely excluded. The left internal iliac artery was patent.  Dr. Edilia Bo replaced the wire into the pigtail catheter, straightening out the crook in the catheter. The catheter was removed.   I turned my attention to repairing the left common femoral artery. The left common femoral artery was repaired by cinching down the ProGlide devices in two stages. In the first stage, the wire was left in place after removing the sheath. There was absolutely no drop in pressure with removal of the sheath  and then there was minimal blood loss with the wire in place. The wire was then removed from the right groin and the white tightening stitches was pulled to fully tighten the Proglide devices. All four sutures were held in place with a hemostat with tension on the skin.  In a similar fashion, Dr. Edilia Bo repaired the right common femoral artery.  After some gentle pressure, both common femoral artery were no longer bleeding.  The patient was given 30 mg of Protamine to finish reversal of Heparin.  All sutures were transected with the suture transection device.  I repaired the left groin incision with a U stitch of 4-0 Monocryl. The right groin was repaired by Dr. Edilia Bo in a similar fashion.    SPECIMEN(S): none   COMPLICATIONS: none   CONDITION: stable   Leonides Sake, MD [facility] [place] Office: [phone] Pager: [phone]  09/18/2012, 10:56 AM

## 2012-09-18 NOTE — Anesthesia Procedure Notes (Signed)
Procedure Name: Intubation Date/Time: 09/18/2012 8:42 AM Performed by: Ellin Goodie Pre-anesthesia Checklist: Patient identified, Emergency Drugs available, Suction available, Patient being monitored and Timeout performed Patient Re-evaluated:Patient Re-evaluated prior to inductionOxygen Delivery Method: Circle system utilized Preoxygenation: Pre-oxygenation with 100% oxygen Intubation Type: IV induction Ventilation: Mask ventilation without difficulty Laryngoscope Size: Mac and 3 Grade View: Grade I Tube type: Oral Tube size: 7.5 mm Number of attempts: 1 Airway Equipment and Method: Stylet Placement Confirmation: ETT inserted through vocal cords under direct vision,  positive ETCO2,  CO2 detector and breath sounds checked- equal and bilateral Secured at: 22 cm Tube secured with: Tape Dental Injury: Teeth and Oropharynx as per pre-operative assessment  Comments: Easy atraumatic induction and intubation with MAC 3 blade.  Dr. Chaney Malling verified placement of ETT.  Carlynn Herald, CRNA

## 2012-09-18 NOTE — Progress Notes (Signed)
Utilization review completed.  

## 2012-09-19 ENCOUNTER — Encounter (HOSPITAL_COMMUNITY): Payer: Self-pay | Admitting: Vascular Surgery

## 2012-09-19 LAB — CBC
HCT: 35.2 % — ABNORMAL LOW (ref 39.0–52.0)
Hemoglobin: 11.8 g/dL — ABNORMAL LOW (ref 13.0–17.0)
MCH: 29.4 pg (ref 26.0–34.0)
MCHC: 33.5 g/dL (ref 30.0–36.0)
MCV: 87.6 fL (ref 78.0–100.0)
RDW: 13.6 % (ref 11.5–15.5)

## 2012-09-19 LAB — BASIC METABOLIC PANEL
BUN: 7 mg/dL (ref 6–23)
Creatinine, Ser: 0.98 mg/dL (ref 0.50–1.35)
GFR calc Af Amer: >90 mL/min (ref >90)
GFR calc non Af Amer: 79 mL/min — ABNORMAL LOW (ref >90)
Glucose, Bld: 106 mg/dL — ABNORMAL HIGH (ref 70–99)

## 2012-09-19 NOTE — Discharge Summary (Signed)
Agree with plans for D/C today.

## 2012-09-19 NOTE — Progress Notes (Signed)
Pr-0.15, qrs-0.08, qt-0.36, qtc-0.38

## 2012-09-19 NOTE — Progress Notes (Signed)
Discharge instructions given to patient and daughter with [employer].  All questions answered.  No complaints, refused 10 AM meds. Discharged with daughters.

## 2012-09-19 NOTE — Progress Notes (Signed)
VASCULAR PROGRESS NOTE  SUBJECTIVE: No complaints.  PHYSICAL EXAM: Filed Vitals:   09/18/12 2140 09/19/12 0000 09/19/12 0330 09/19/12 0500  BP: 110/52 98/49 104/48   Pulse: 82 82 70   Temp:  101.3 F (38.5 C) 101.2 F (38.4 C) 98.7 F (37.1 C)  TempSrc:  Oral Oral Oral  Resp: 20 27 19    Height:      Weight:      SpO2: 94% 98% 92%    Groins look fine. Feet warm and well perfused.   LABS: Lab Results  Component Value Date   WBC 7.9 09/19/2012   HGB 11.8* 09/19/2012   HCT 35.2* 09/19/2012   MCV 87.6 09/19/2012   PLT 143* 09/19/2012   Lab Results  Component Value Date   CREATININE 0.98 09/19/2012   Lab Results  Component Value Date   INR 1.06 09/18/2012   ASSESSMENT AND PLAN:  * Doing well. * D/c today * F/U in 2 weeks to discuss/schedule L CEA * I have discussed with him the importance of taking his ASA and his Statin.   Cari Caraway Beeper: [phone] 09/19/2012

## 2012-09-19 NOTE — Discharge Summary (Signed)
[facility] Discharge Summary   Patient ID:  Alan Meyers MRN: [id number] DOB/AGE: [birth year] 77 y.o.  Admit date: 09/18/2012 Discharge date: 09/19/2012 Date of Surgery: 09/18/2012 Surgeon: Surgeon(s): Chuck Hint, MD Fransisco Hertz, MD  Admission Diagnosis: Abdominal Aortic Aneurysm  Discharge Diagnoses:  Abdominal Aortic Aneurysm  Secondary Diagnoses: Past Medical History  Diagnosis Date  . AAA (abdominal aortic aneurysm)     6.1 - 6.2cm  . Hemorrhoids   . Hyperlipidemia   . Peripheral neuropathy   . Diarrhea, functional 08/01/12  . Peripheral arterial disease   . Plantar warts 05/29/12  . Pulsatile abdominal mass 08/01/12  . Hypertension     no meds for sev years    Procedure(s): ABDOMINAL AORTIC ENDOVASCULAR STENT GRAFT  Discharged Condition: good  HPI:  Alan Meyers is a 77 y.o. male who was being worked up for diarrhea. Workup included an ultrasound that the patient had a pulsatile abdominal mass. This showed a 6.1 cm infrarenal abdominal aortic aneurysm. I saw him in consultation on 08/06/2012. Denies any abdominal or back pain. He does state that she's had alternating constipation and diarrhea for some time and has undergone an extensive workup for this which has been unremarkable. He had a CT angiogram and comes in to discuss these results and consider elective repair of his aneurysm.   He was admitted for EVAR of AAA  In addition, I detected a left carotid bruit and therefore he had a carotid duplex scan today also. He denies any history of stroke, TIAs, expressive or receptive aphasia, or amaurosis fugax. He has not been on aspirin and I have previously asked him to begin taking aspirin.  Patient has an asymptomatic greater than 80% left carotid stenosis. (He has a less than 40% right carotid stenosis). I have recommended we proceed with EVAR and once he has recovered from this to proceed with a staged left carotid endarterectomy. the large  aneurysm is more pressing in that his risk of perioperative stroke is fairly low.    Hospital Course:  Alan Meyers is a 77 y.o. male is S/P  Procedure(s): ABDOMINAL AORTIC ENDOVASCULAR STENT GRAFT Percutaneous endovascular aneurysm repair using the Gore Excluder device ( 2 components ipsilateral component which was a 26 mm x 14 mm x 18 cm in length device  Contralateral limb 14 mm x 12 cm device Extubated: POD # 0 Physical exam: abdomen soft Groin wounds soft, no hematoma BLE warm with good sensation , motion and perfusion Post-op wounds healing well Pt. Ambulating, voiding and taking PO diet without difficulty. Pt pain controlled with PO pain meds. Labs as below Complications:none  Consults:     Significant Diagnostic Studies: CBC Lab Results  Component Value Date   WBC 7.9 09/19/2012   HGB 11.8* 09/19/2012   HCT 35.2* 09/19/2012   MCV 87.6 09/19/2012   PLT 143* 09/19/2012    BMET    Component Value Date/Time   NA 134* 09/19/2012 0452   K 3.9 09/19/2012 0452   CL 103 09/19/2012 0452   CO2 24 09/19/2012 0452   GLUCOSE 106* 09/19/2012 0452   BUN 7 09/19/2012 0452   CREATININE 0.98 09/19/2012 0452   CREATININE 1.00 08/18/2012 1330   CALCIUM 8.4 09/19/2012 0452   GFRNONAA 79* 09/19/2012 0452   GFRAA >90 09/19/2012 0452   COAG Lab Results  Component Value Date   INR 1.06 09/18/2012   INR 0.97 09/12/2012     Disposition:  Discharge to :Home Discharge Orders  Future Appointments Provider Department Dept Phone   10/15/2012 1:30 PM [facility] [phone]   10/15/2012 2:30 PM [facility] [phone]   Eat a light meal the night before the exam but please avoid gaseous foods.   Nothing to eat or drink for at least 8 hours prior to the exam. No gum chewing or smoking the morning of the exam. Please take your morning medications with small sips of water, especially blood pressure medication.  If you have several vascular lab exams and will see physician, please bring a snack with you.   10/15/2012 3:15 PM Chuck Hint, MD [facility] [phone]   Future Orders Complete By Expires     ABDOMINAL PROCEDURE/ANEURYSM REPAIR/AORTO-BIFEMORAL BYPASS:  Call MD for increased abdominal pain; cramping diarrhea; nausea/vomiting  As directed     Call MD for:  redness, tenderness, or signs of infection (pain, swelling, bleeding, redness, odor or green/yellow discharge around incision site)  As directed     Call MD for:  severe or increased pain, loss or decreased feeling  in affected limb(s)  As directed     Call MD for:  temperature >100.5  As directed     Driving Restrictions  As directed     Comments:      No driving for 2 weeks    Increase activity slowly  As directed     Comments:      Walk with assistance use walker or cane as needed    Lifting restrictions  As directed     Comments:      No lifting for 4 weeks    May shower   As directed     Scheduling Instructions:      Saturday    Remove dressing in 24 hours  As directed     Resume previous diet  As directed     may wash over wound with mild soap and water  As directed         Medication List         aspirin 81 MG tablet  Take 81 mg by mouth daily.     oxyCODONE-acetaminophen 5-325 MG per tablet  Commonly known as:  ROXICET  Take 1-2 tablets by mouth every 4 (four) hours as needed for pain.       Verbal and written Discharge instructions given to the patient. Wound care per Discharge AVS     Follow-up Information   Follow up with DICKSON,CHRISTOPHER S, MD In 1 month. (office will arrange -sent)    Contact information:   [address] [place] [address] [phone]       Signed: Marlowe Shores 09/19/2012, 9:45 AM  - For VQI Registry use --- Instructions: Press F2 to tab through selections.  Delete question if not applicable.   Post-op:  Time to Extubation: [x ]  In OR, [ ]  < 12 hrs, [ ]  12-24 hrs, [ ]  >=24 hrs Vasopressors Req. Post-op: No MI: [x ] No, [ ]  Troponin only, [ ]  EKG or Clinical New Arrhythmia: No CHF: No ICU Stay: 1 days Transfusion: No    Complications: Resp failure: [x ] none, [ ]  Pneumonia, [ ]  Ventilator Chg in renal function: [x ] none, [ ]  Inc. Cr > 0.5, [ ]  Temp. Dialysis, [ ]  Permanent dialysis Leg ischemia: [x ] No, [ ]  Yes, no Surgery needed, [ ]  Yes, Surgery needed, [ ]   Amputation Bowel ischemia: [x ] No, [ ]  Medical Rx, [ ]  Surgical Rx Wound complication: [ x] No, [ ]  Superficial separation/infection, [ ]  Return to OR Return to OR: No  Return to OR for bleeding: No Stroke: [x ] None, [ ]  Minor, [ ]  Major  Discharge medications: Statin use:  Yes ASA use:  Yes Plavix use: no Beta blocker use:  no

## 2012-10-15 ENCOUNTER — Other Ambulatory Visit: Payer: Medicare Other

## 2012-10-15 ENCOUNTER — Ambulatory Visit: Payer: Medicare Other | Admitting: Vascular Surgery

## 2012-10-21 ENCOUNTER — Encounter: Payer: Self-pay | Admitting: Vascular Surgery

## 2012-10-22 ENCOUNTER — Ambulatory Visit (INDEPENDENT_AMBULATORY_CARE_PROVIDER_SITE_OTHER): Payer: Medicare Other | Admitting: Vascular Surgery

## 2012-10-22 ENCOUNTER — Encounter: Payer: Self-pay | Admitting: Vascular Surgery

## 2012-10-22 ENCOUNTER — Ambulatory Visit
Admission: RE | Admit: 2012-10-22 | Discharge: 2012-10-22 | Disposition: A | Discharge status: Home / Self Care | Payer: Medicare Other | Source: Ambulatory Visit | Attending: Vascular Surgery | Admitting: Vascular Surgery

## 2012-10-22 ENCOUNTER — Encounter (HOSPITAL_COMMUNITY): Payer: Self-pay

## 2012-10-22 ENCOUNTER — Other Ambulatory Visit: Payer: Self-pay

## 2012-10-22 ENCOUNTER — Ambulatory Visit: Payer: Medicare Other | Admitting: Vascular Surgery

## 2012-10-22 ENCOUNTER — Encounter (HOSPITAL_COMMUNITY)
Admission: RE | Admit: 2012-10-22 | Discharge: 2012-10-22 | Disposition: A | Discharge status: Home / Self Care | Payer: Medicare Other | Source: Ambulatory Visit | Attending: Vascular Surgery | Admitting: Vascular Surgery

## 2012-10-22 VITALS — BP 163/74 | HR 66 | Resp 18 | Ht 70.5 in | Wt 156.0 lb

## 2012-10-22 DIAGNOSIS — I714 Abdominal aortic aneurysm, without rupture, unspecified: Secondary | ICD-10-CM

## 2012-10-22 DIAGNOSIS — Z48812 Encounter for surgical aftercare following surgery on the circulatory system: Secondary | ICD-10-CM

## 2012-10-22 DIAGNOSIS — Z01818 Encounter for other preprocedural examination: Secondary | ICD-10-CM | POA: Insufficient documentation

## 2012-10-22 DIAGNOSIS — Z01810 Encounter for preprocedural cardiovascular examination: Secondary | ICD-10-CM | POA: Insufficient documentation

## 2012-10-22 HISTORY — DX: Headache: R51

## 2012-10-22 LAB — CBC
HCT: 40.2 % (ref 39.0–52.0)
Hemoglobin: 13.8 g/dL (ref 13.0–17.0)
MCHC: 34.3 g/dL (ref 30.0–36.0)
MCV: 87.2 fL (ref 78.0–100.0)
RDW: 13.3 % (ref 11.5–15.5)

## 2012-10-22 LAB — URINALYSIS, ROUTINE W REFLEX MICROSCOPIC
Bilirubin Urine: NEGATIVE
Glucose, UA: NEGATIVE mg/dL
Hgb urine dipstick: NEGATIVE
Ketones, ur: NEGATIVE mg/dL
Specific Gravity, Urine: 1.045 — ABNORMAL HIGH (ref 1.005–1.030)
pH: 7 (ref 5.0–8.0)

## 2012-10-22 LAB — COMPREHENSIVE METABOLIC PANEL
Albumin: 3.9 g/dL (ref 3.5–5.2)
Alkaline Phosphatase: 70 U/L (ref 39–117)
BUN: 10 mg/dL (ref 6–23)
Chloride: 102 mEq/L (ref 96–112)
Creatinine, Ser: 0.94 mg/dL (ref 0.50–1.35)
GFR calc Af Amer: >90 mL/min (ref >90)
Glucose, Bld: 146 mg/dL — ABNORMAL HIGH (ref 70–99)
Potassium: 4.6 mEq/L (ref 3.5–5.1)
Total Bilirubin: 0.9 mg/dL (ref 0.3–1.2)
Total Protein: 7.7 g/dL (ref 6.0–8.3)

## 2012-10-22 LAB — APTT: aPTT: 29 seconds (ref 24–37)

## 2012-10-22 LAB — PROTIME-INR
INR: 0.92 (ref 0.00–1.49)
Prothrombin Time: 12.2 seconds (ref 11.6–15.2)

## 2012-10-22 MED ORDER — IOHEXOL 350 MG/ML SOLN
80.0000 mL | Freq: Once | INTRAVENOUS | Status: AC | PRN
  Administered 2012-10-22: 80 mL via INTRAVENOUS

## 2012-10-22 NOTE — Progress Notes (Signed)
[facility] [place]  Patient name: Marlon Rufus MRN: [id number] DOB: 04/28/1935 Sex: male  REASON FOR VISIT: Follow up after percutaneous endovascular aneurysm repair of a 6.6 cm infrarenal abdominal aortic aneurysm.  HPI: Rock Felber is a 77 y.o. male who was found to have a 6.6 cm infrarenal abdominal aortic aneurysm. He underwent percutaneous endovascular aneurysm repair using the Gore Excluder device on 09/18/2012. He comes in for a one-month follow up visit. Of note, on his preoperative workup he was found to have a carotid bruit and a carotid duplex scan suggested a greater than 80% left carotid stenosis. As this was asymptomatic, and given the size of his aneurysm, I recommended that we proceed with repair of his aneurysm first and then plan on staged left carotid endarterectomy.  He denies any history of previous stroke, TIAs, expressive or receptive aphasia, or amaurosis fugax. He is on aspirin. We have discussed taking a statin on multiple occasions however he is not agreeable to do this.  For VQI Use Only   PRE-ADM LIVING: [X ] Home, [ ] Nursing home, [ ] Homeless  AMB STATUS: [X ] Walking, [ ] Walking w/ Assistance, [ ] Wheelchair, [ ]Bed ridden  RECENT HEART ATTACK (<6 mon): No  CAD Sx: [X ] No, [ ] Asx, h/o MI, [ ] Stable angina, [ ] Unstable angina  PRIOR CHF: [X ] No, [ ] Asx, [ ] Mild, [ ] Moderate, [ ] Severe  STRESS TEST: [ ] No, [X ] Normal, [ ] + ischemia, [ ] + MI, [ ] Both  (Low risk for stress nuclear study with a small area of inferior basilar wall ischemia. Ejection fraction 51%. Inferiobasal wall hypokinesis.  ASA: yes   Statin: patient refuses  Past Medical History  Diagnosis Date  . AAA (abdominal aortic aneurysm)     6.1 - 6.2cm  . Hemorrhoids   . Hyperlipidemia   . Peripheral neuropathy   . Diarrhea, functional 08/01/12  . Peripheral arterial disease   . Plantar warts 05/29/12  . Pulsatile abdominal mass 08/01/12  . Hypertension      no meds for sev years   Family History  Problem Relation Age of Onset  . Tuberculosis Mother   . Heart disease Father     Valvular heart disease and Congestive heart failure  . Heart attack Father   . Cancer Sister   . COPD Brother   . COPD Brother    SOCIAL HISTORY: History  Substance Use Topics  . Smoking status: Former Smoker -- 1.50 packs/day for 40 years    Types: Cigarettes    Quit date: 02/27/2003  . Smokeless tobacco: Never Used  . Alcohol Use: No   No Known Allergies  Current Outpatient Prescriptions  Medication Sig Dispense Refill  . aspirin 81 MG tablet Take 81 mg by mouth daily.      . oxyCODONE-acetaminophen (ROXICET) 5-325 MG per tablet Take 1-2 tablets by mouth every 4 (four) hours as needed for pain.  30 tablet  0   No current facility-administered medications for this visit.   REVIEW OF SYSTEMS: [X ] denotes positive finding; [  ] denotes negative finding  CARDIOVASCULAR:  [ ] chest pain   [ ] chest pressure   [ ] palpitations   [ ] orthopnea   [ ] dyspnea on exertion   [ ] claudication   [ ] rest pain   [X ] DVT   [ ] phlebitis PULMONARY:   [ ]   productive cough   [ ] asthma   [ ] wheezing NEUROLOGIC:   [ ] weakness  [ ] paresthesias  [ ] aphasia  [ ] amaurosis  [ ] dizziness HEMATOLOGIC:   [ ] bleeding problems   [ ] clotting disorders MUSCULOSKELETAL:  [ ] joint pain   [ ] joint swelling [ ] leg swelling GASTROINTESTINAL: [ ]  blood in stool  [ ]  hematemesis GENITOURINARY:  [ ]  dysuria  [ ]  hematuria PSYCHIATRIC:  [ ] history of major depression INTEGUMENTARY:  [ ] rashes  [ ] ulcers CONSTITUTIONAL:  [ ] fever   [ ] chills  PHYSICAL EXAM: Filed Vitals:   10/22/12 0947  BP: 163/74  Pulse: 66  Resp: 18  Height: 5' 10.5" (1.791 m)  Weight: 156 lb (70.761 kg)   Body mass index is 22.06 kg/(m^2). GENERAL: The patient is a well-nourished male, in no acute distress. The vital signs are documented above. CARDIOVASCULAR: There is a regular rate  and rhythm. He has a left carotid bruit. He has palpable femoral pulses. Both feet are warm and well perfused. PULMONARY: There is good air exchange bilaterally without wheezing or rales. ABDOMEN: Soft and non-tender with normal pitched bowel sounds.  MUSCULOSKELETAL: There are no major deformities or cyanosis. NEUROLOGIC: No focal weakness or paresthesias are detected. SKIN: There are no ulcers or rashes noted. PSYCHIATRIC: The patient has a normal affect.  DATA:  His graft appears to be in good position. I measured the maximum diameter of his aneurysm at 6.6 cm which is thus not changed in size. There is no evidence of endoleak.  His carotid duplex scan from 08/27/12 shows a greater than 80% left internal carotid artery stenosis. Peak systolic velocity is 325 cm/s in the mid internal carotid artery with an end-diastolic velocity of 132 cm/s. There is a less than 40% right internal carotid artery stenosis.  MEDICAL ISSUES: ABDOMINAL AORTIC ANEURYSM: The patient is doing well status post PEVAR. His follow up CT scan looks good without evidence of endoleak and the graft is in good position. I've ordered a follow up CT scan in 6 months.  GREATER THAN 80% left carotid stenosis. I have recommended left carotid endarterectomy. I have reviewed the indications for carotid endarterectomy, that is to lower the risk of future stroke. I have also reviewed the potential complications of surgery, including but not limited to: bleeding, stroke (perioperative risk 1-2%), MI, nerve injury of other unpredictable medical problems. All of the patients questions were answered and they are agreeable to proceed with surgery. The patient is on aspirin and knows to continue his aspirin right up through surgery. We have discussed taking a statin on multiple occasions I have explained that this does lower his risk of perioperative stroke and long for mortality based on several vascular studies. Currently the patient is not  willing to begin taking a statin despite multiple conversations.  His surgery is scheduled for 10/28/12.    DICKSON,CHRISTOPHER S [facility] [place] Beeper: [phone]    

## 2012-10-22 NOTE — Pre-Procedure Instructions (Signed)
Alan Meyers  10/22/2012   Your procedure is scheduled on:  Tuesday, October 28, 2012  Report to [facility] at 5:30 AM.  Call this number if you have problems the morning of surgery: [phone]   Remember:   Do not eat food or drink liquids after midnight.   Take these medicines the morning of surgery with A SIP OF WATER: If needed:oxyCODONE-acetaminophen (ROXICET) 5-325 MG  per tablet .  Stop taking vitamins and herbal medications. Do not take any NSAIDs ie: Ibuprofen, Advil, Naproxen or any medication containing Aspirin.  Do not wear jewelry, make-up or nail polish.  Do not wear lotions, powders, or perfumes. You may wear deodorant.  Do not shave 48 hours prior to surgery. Men may shave face and neck.  Do not bring valuables to the hospital.  [facility] is not responsible for any belongings or valuables.  Contacts, dentures or bridgework may not be worn into surgery.  Leave suitcase in the car. After surgery it may be brought to your room.  For patients admitted to the hospital, checkout time is 11:00 AM the day of discharge.   Patients discharged the day of surgery will not be allowed to drive home.  Name and phone number of your driver:   Special Instructions: Shower using CHG 2 nights before surgery and the night before surgery.  If you shower the day of surgery use CHG.  Use special wash - you have one bottle of CHG for all showers.  You should use approximately 1/3 of the bottle for each shower.   Please read over the following fact sheets that you were given: Pain Booklet, Coughing and Deep Breathing, Blood Transfusion Information, MRSA Information and Surgical Site Infection Prevention

## 2012-10-22 NOTE — Progress Notes (Signed)
Pt denies SOB, chest pain, and being under the care of a cardiologist. Pt denies having an echo and states that he thinks that he had a cardiac catheterization before his surgery in July. Pt states that Dr. Edilia Bo advised him to continue taking the 81 mg Aspirin daily. Pt made aware to stop any vitamins and herbal medications( fish oil). Do not take any NSAIDs ie: Ibuprofen, Advil, Naproxen or any medication containing Aspirin.

## 2012-10-23 ENCOUNTER — Other Ambulatory Visit: Payer: Self-pay | Admitting: *Deleted

## 2012-10-23 ENCOUNTER — Encounter (HOSPITAL_COMMUNITY): Payer: Self-pay | Admitting: Pharmacy Technician

## 2012-10-23 DIAGNOSIS — I739 Peripheral vascular disease, unspecified: Secondary | ICD-10-CM

## 2012-10-23 DIAGNOSIS — I714 Abdominal aortic aneurysm, without rupture, unspecified: Secondary | ICD-10-CM

## 2012-10-23 DIAGNOSIS — Z48812 Encounter for surgical aftercare following surgery on the circulatory system: Secondary | ICD-10-CM

## 2012-10-27 MED ORDER — DEXTROSE 5 % IV SOLN
1.5000 g | INTRAVENOUS | Status: DC
  Filled 2012-10-27: qty 1.5

## 2012-10-28 ENCOUNTER — Encounter (HOSPITAL_COMMUNITY): Payer: Self-pay | Admitting: Anesthesiology

## 2012-10-28 ENCOUNTER — Inpatient Hospital Stay (HOSPITAL_COMMUNITY)
Admission: RE | Admit: 2012-10-28 | Discharge: 2012-10-29 | DRG: 039 | Disposition: A | Discharge status: Home / Self Care | Payer: Medicare Other | Source: Ambulatory Visit | Attending: Vascular Surgery | Admitting: Vascular Surgery

## 2012-10-28 ENCOUNTER — Inpatient Hospital Stay (HOSPITAL_COMMUNITY): Payer: Medicare Other | Admitting: Anesthesiology

## 2012-10-28 ENCOUNTER — Encounter (HOSPITAL_COMMUNITY)
Admission: RE | Disposition: A | Discharge status: Home / Self Care | Payer: Self-pay | Source: Ambulatory Visit | Attending: Vascular Surgery

## 2012-10-28 ENCOUNTER — Encounter (HOSPITAL_COMMUNITY): Payer: Self-pay | Admitting: *Deleted

## 2012-10-28 DIAGNOSIS — I6529 Occlusion and stenosis of unspecified carotid artery: Principal | ICD-10-CM | POA: Diagnosis present

## 2012-10-28 DIAGNOSIS — I714 Abdominal aortic aneurysm, without rupture, unspecified: Secondary | ICD-10-CM | POA: Diagnosis present

## 2012-10-28 DIAGNOSIS — E785 Hyperlipidemia, unspecified: Secondary | ICD-10-CM | POA: Diagnosis present

## 2012-10-28 DIAGNOSIS — I10 Essential (primary) hypertension: Secondary | ICD-10-CM | POA: Diagnosis present

## 2012-10-28 DIAGNOSIS — Z7982 Long term (current) use of aspirin: Secondary | ICD-10-CM

## 2012-10-28 DIAGNOSIS — I739 Peripheral vascular disease, unspecified: Secondary | ICD-10-CM | POA: Diagnosis present

## 2012-10-28 DIAGNOSIS — G609 Hereditary and idiopathic neuropathy, unspecified: Secondary | ICD-10-CM | POA: Diagnosis present

## 2012-10-28 DIAGNOSIS — Z79899 Other long term (current) drug therapy: Secondary | ICD-10-CM

## 2012-10-28 HISTORY — PX: ENDARTERECTOMY: SHX5162

## 2012-10-28 SURGERY — ENDARTERECTOMY, CAROTID
Anesthesia: General | Site: Neck | Laterality: Left

## 2012-10-28 MED ORDER — DOCUSATE SODIUM 100 MG PO CAPS
100.0000 mg | ORAL_CAPSULE | Freq: Every day | ORAL | Status: DC
  Filled 2012-10-28: qty 1

## 2012-10-28 MED ORDER — SENNOSIDES-DOCUSATE SODIUM 8.6-50 MG PO TABS
1.0000 | ORAL_TABLET | Freq: Every evening | ORAL | Status: DC | PRN
  Filled 2012-10-28: qty 1

## 2012-10-28 MED ORDER — HYDRALAZINE HCL 20 MG/ML IJ SOLN: 10.0000 mg | INTRAMUSCULAR | Status: DC | PRN

## 2012-10-28 MED ORDER — HYDROMORPHONE HCL PF 1 MG/ML IJ SOLN: 0.2500 mg | INTRAMUSCULAR | Status: DC | PRN

## 2012-10-28 MED ORDER — SODIUM CHLORIDE 0.9 % IV SOLN: INTRAVENOUS | Status: DC

## 2012-10-28 MED ORDER — METOPROLOL TARTRATE 1 MG/ML IV SOLN: 2.0000 mg | INTRAVENOUS | Status: DC | PRN

## 2012-10-28 MED ORDER — PROMETHAZINE HCL 25 MG/ML IJ SOLN: 6.2500 mg | INTRAMUSCULAR | Status: DC | PRN

## 2012-10-28 MED ORDER — PROTAMINE SULFATE 10 MG/ML IV SOLN
INTRAVENOUS | Status: DC | PRN
  Administered 2012-10-28: 30 mg via INTRAVENOUS

## 2012-10-28 MED ORDER — DEXTRAN 40 IN SALINE 10-0.9 % IV SOLN
INTRAVENOUS | Status: AC
  Filled 2012-10-28: qty 500

## 2012-10-28 MED ORDER — LIDOCAINE HCL (PF) 1 % IJ SOLN
INTRAMUSCULAR | Status: AC
  Filled 2012-10-28: qty 30

## 2012-10-28 MED ORDER — PROPOFOL 10 MG/ML IV BOLUS
INTRAVENOUS | Status: DC | PRN
  Administered 2012-10-28: 140 mg via INTRAVENOUS
  Administered 2012-10-28: 40 mg via INTRAVENOUS
  Administered 2012-10-28: 20 mg via INTRAVENOUS

## 2012-10-28 MED ORDER — ACETAMINOPHEN 325 MG PO TABS: 325.0000 mg | ORAL_TABLET | ORAL | Status: DC | PRN

## 2012-10-28 MED ORDER — SODIUM CHLORIDE 0.9 % IV SOLN
500.0000 mL | Freq: Once | INTRAVENOUS | Status: AC | PRN
  Administered 2012-10-28 (×2): 500 mL via INTRAVENOUS

## 2012-10-28 MED ORDER — GUAIFENESIN-DM 100-10 MG/5ML PO SYRP: 15.0000 mL | ORAL_SOLUTION | ORAL | Status: DC | PRN

## 2012-10-28 MED ORDER — ONDANSETRON HCL 4 MG/2ML IJ SOLN: 4.0000 mg | Freq: Four times a day (QID) | INTRAMUSCULAR | Status: DC | PRN

## 2012-10-28 MED ORDER — LUTEIN 6 MG PO TABS: 1.0000 | ORAL_TABLET | Freq: Every day | ORAL | Status: DC

## 2012-10-28 MED ORDER — ALUM & MAG HYDROXIDE-SIMETH 200-200-20 MG/5ML PO SUSP: 15.0000 mL | ORAL | Status: DC | PRN

## 2012-10-28 MED ORDER — OXYCODONE-ACETAMINOPHEN 5-325 MG PO TABS: 1.0000 | ORAL_TABLET | ORAL | Status: DC | PRN

## 2012-10-28 MED ORDER — DEXTROSE 5 % IV SOLN
1.5000 g | Freq: Two times a day (BID) | INTRAVENOUS | Status: AC
  Administered 2012-10-28 – 2012-10-29 (×2): 1.5 g via INTRAVENOUS
  Filled 2012-10-28 (×2): qty 1.5

## 2012-10-28 MED ORDER — DOPAMINE-DEXTROSE 3.2-5 MG/ML-% IV SOLN
INTRAVENOUS | Status: AC
  Filled 2012-10-28: qty 250

## 2012-10-28 MED ORDER — GLYCOPYRROLATE 0.2 MG/ML IJ SOLN
INTRAMUSCULAR | Status: DC | PRN
  Administered 2012-10-28: 0.2 mg via INTRAVENOUS

## 2012-10-28 MED ORDER — ENOXAPARIN SODIUM 30 MG/0.3ML ~~LOC~~ SOLN
30.0000 mg | SUBCUTANEOUS | Status: DC
  Administered 2012-10-29: 30 mg via SUBCUTANEOUS
  Filled 2012-10-28 (×2): qty 0.3

## 2012-10-28 MED ORDER — MAGNESIUM SULFATE 40 MG/ML IJ SOLN
2.0000 g | Freq: Once | INTRAMUSCULAR | Status: AC | PRN
  Filled 2012-10-28: qty 50

## 2012-10-28 MED ORDER — LACTATED RINGERS IV SOLN
INTRAVENOUS | Status: DC | PRN
  Administered 2012-10-28: 07:00:00 via INTRAVENOUS

## 2012-10-28 MED ORDER — LIDOCAINE HCL (CARDIAC) 20 MG/ML IV SOLN
INTRAVENOUS | Status: DC | PRN
  Administered 2012-10-28: 55 mg via INTRAVENOUS

## 2012-10-28 MED ORDER — PANTOPRAZOLE SODIUM 40 MG PO TBEC
40.0000 mg | DELAYED_RELEASE_TABLET | Freq: Every day | ORAL | Status: DC
  Administered 2012-10-28: 40 mg via ORAL
  Filled 2012-10-28 (×2): qty 1

## 2012-10-28 MED ORDER — DOPAMINE-DEXTROSE 3.2-5 MG/ML-% IV SOLN
3.0000 ug/kg/min | INTRAVENOUS | Status: DC
  Administered 2012-10-28: 3 ug/kg/min via INTRAVENOUS

## 2012-10-28 MED ORDER — 0.9 % SODIUM CHLORIDE (POUR BTL) OPTIME
TOPICAL | Status: DC | PRN
  Administered 2012-10-28: 2000 mL

## 2012-10-28 MED ORDER — OXYCODONE HCL 5 MG/5ML PO SOLN: 5.0000 mg | Freq: Once | ORAL | Status: DC | PRN

## 2012-10-28 MED ORDER — SODIUM CHLORIDE 0.9 % IR SOLN
Status: DC | PRN
  Administered 2012-10-28: 07:00:00

## 2012-10-28 MED ORDER — ASPIRIN 81 MG PO TABS
81.0000 mg | ORAL_TABLET | Freq: Every day | ORAL | Status: DC
  Filled 2012-10-28 (×2): qty 1

## 2012-10-28 MED ORDER — MORPHINE SULFATE 2 MG/ML IJ SOLN: 2.0000 mg | INTRAMUSCULAR | Status: DC | PRN

## 2012-10-28 MED ORDER — BISACODYL 10 MG RE SUPP: 10.0000 mg | Freq: Every day | RECTAL | Status: DC | PRN

## 2012-10-28 MED ORDER — SODIUM CHLORIDE 0.9 % IV SOLN
10.0000 mg | INTRAVENOUS | Status: DC | PRN
  Administered 2012-10-28: 50 ug/min via INTRAVENOUS

## 2012-10-28 MED ORDER — POTASSIUM CHLORIDE CRYS ER 20 MEQ PO TBCR: 20.0000 meq | EXTENDED_RELEASE_TABLET | Freq: Once | ORAL | Status: AC | PRN

## 2012-10-28 MED ORDER — HEPARIN SODIUM (PORCINE) 1000 UNIT/ML IJ SOLN
INTRAMUSCULAR | Status: DC | PRN
  Administered 2012-10-28: 7000 [IU] via INTRAVENOUS

## 2012-10-28 MED ORDER — LIDOCAINE-EPINEPHRINE (PF) 1 %-1:200000 IJ SOLN
INTRAMUSCULAR | Status: AC
  Filled 2012-10-28: qty 10

## 2012-10-28 MED ORDER — ROCURONIUM BROMIDE 100 MG/10ML IV SOLN
INTRAVENOUS | Status: DC | PRN
  Administered 2012-10-28: 40 mg via INTRAVENOUS

## 2012-10-28 MED ORDER — SUFENTANIL CITRATE 50 MCG/ML IV SOLN
INTRAVENOUS | Status: DC | PRN
  Administered 2012-10-28: 5 ug via INTRAVENOUS
  Administered 2012-10-28: 15 ug via INTRAVENOUS

## 2012-10-28 MED ORDER — LIDOCAINE-EPINEPHRINE (PF) 1 %-1:200000 IJ SOLN
INTRAMUSCULAR | Status: DC | PRN
  Administered 2012-10-28: 10 mL

## 2012-10-28 MED ORDER — OCUVITE-LUTEIN PO CAPS
1.0000 | ORAL_CAPSULE | Freq: Every day | ORAL | Status: DC
  Filled 2012-10-28: qty 1

## 2012-10-28 MED ORDER — ACETAMINOPHEN 650 MG RE SUPP: 325.0000 mg | RECTAL | Status: DC | PRN

## 2012-10-28 MED ORDER — LABETALOL HCL 5 MG/ML IV SOLN: 10.0000 mg | INTRAVENOUS | Status: DC | PRN

## 2012-10-28 MED ORDER — OXYCODONE HCL 5 MG PO TABS
5.0000 mg | ORAL_TABLET | Freq: Once | ORAL | Status: DC | PRN
Start: 2012-10-28 — End: 2012-10-28

## 2012-10-28 MED ORDER — THROMBIN 20000 UNITS EX SOLR
CUTANEOUS | Status: AC
  Filled 2012-10-28: qty 20000

## 2012-10-28 MED ORDER — PHENOL 1.4 % MT LIQD: 1.0000 | OROMUCOSAL | Status: DC | PRN

## 2012-10-28 MED ORDER — DEXTRAN 40 IN SALINE 10-0.9 % IV SOLN
INTRAVENOUS | Status: DC | PRN
  Administered 2012-10-28: 500 mL

## 2012-10-28 MED ORDER — ONDANSETRON HCL 4 MG/2ML IJ SOLN
INTRAMUSCULAR | Status: DC | PRN
  Administered 2012-10-28: 4 mg via INTRAVENOUS

## 2012-10-28 SURGICAL SUPPLY — 54 items
BAG DECANTER FOR FLEXI CONT (MISCELLANEOUS) ×4 IMPLANT
CANISTER SUCTION 2500CC (MISCELLANEOUS) ×2 IMPLANT
CANNULA VESSEL W/WING WO/VALVE (CANNULA) ×2 IMPLANT
CATH ROBINSON RED A/P 18FR (CATHETERS) ×2 IMPLANT
CLIP TI MEDIUM 24 (CLIP) ×2 IMPLANT
CLIP TI WIDE RED SMALL 24 (CLIP) ×2 IMPLANT
CLOTH BEACON ORANGE TIMEOUT ST (SAFETY) ×2 IMPLANT
COVER SURGICAL LIGHT HANDLE (MISCELLANEOUS) ×2 IMPLANT
CRADLE DONUT ADULT HEAD (MISCELLANEOUS) ×2 IMPLANT
DERMABOND ADVANCED (GAUZE/BANDAGES/DRESSINGS) ×1
DERMABOND ADVANCED .7 DNX12 (GAUZE/BANDAGES/DRESSINGS) ×1 IMPLANT
DRAIN CHANNEL 15F RND FF W/TCR (WOUND CARE) IMPLANT
DRAPE PROXIMA HALF (DRAPES) ×2 IMPLANT
DRAPE WARM FLUID 44X44 (DRAPE) ×2 IMPLANT
ELECT REM PT RETURN 9FT ADLT (ELECTROSURGICAL) ×2
ELECTRODE REM PT RTRN 9FT ADLT (ELECTROSURGICAL) ×1 IMPLANT
EVACUATOR SILICONE 100CC (DRAIN) IMPLANT
GLOVE BIO SURGEON STRL SZ7.5 (GLOVE) ×4 IMPLANT
GLOVE BIOGEL PI IND STRL 6.5 (GLOVE) ×1 IMPLANT
GLOVE BIOGEL PI IND STRL 7.5 (GLOVE) ×2 IMPLANT
GLOVE BIOGEL PI IND STRL 8 (GLOVE) ×1 IMPLANT
GLOVE BIOGEL PI INDICATOR 6.5 (GLOVE) ×1
GLOVE BIOGEL PI INDICATOR 7.5 (GLOVE) ×2
GLOVE BIOGEL PI INDICATOR 8 (GLOVE) ×1
GLOVE ECLIPSE 6.5 STRL STRAW (GLOVE) ×2 IMPLANT
GLOVE SS BIOGEL STRL SZ 7.5 (GLOVE) ×1 IMPLANT
GLOVE SS N UNI LF 7.0 STRL (GLOVE) ×2 IMPLANT
GLOVE SUPERSENSE BIOGEL SZ 7.5 (GLOVE) ×1
GLOVE SURG SS PI 7.0 STRL IVOR (GLOVE) ×4 IMPLANT
GOWN STRL NON-REIN LRG LVL3 (GOWN DISPOSABLE) ×8 IMPLANT
GOWN STRL REIN XL XLG (GOWN DISPOSABLE) ×4 IMPLANT
KIT BASIN OR (CUSTOM PROCEDURE TRAY) ×2 IMPLANT
KIT ROOM TURNOVER OR (KITS) ×2 IMPLANT
NEEDLE HYPO 25X1 1.5 SAFETY (NEEDLE) ×2 IMPLANT
NS IRRIG 1000ML POUR BTL (IV SOLUTION) ×4 IMPLANT
PACK CAROTID (CUSTOM PROCEDURE TRAY) ×2 IMPLANT
PAD ARMBOARD 7.5X6 YLW CONV (MISCELLANEOUS) ×4 IMPLANT
SHUNT CAROTID BYPASS 10 (VASCULAR PRODUCTS) IMPLANT
SHUNT CAROTID BYPASS 12FRX15.5 (VASCULAR PRODUCTS) ×2 IMPLANT
SPONGE SURGIFOAM ABS GEL 100 (HEMOSTASIS) IMPLANT
SUT PROLENE 6 0 BV (SUTURE) ×4 IMPLANT
SUT PROLENE 7 0 BV 1 (SUTURE) IMPLANT
SUT SILK 2 0 FS (SUTURE) IMPLANT
SUT SILK 3 0 (SUTURE) ×1
SUT SILK 3 0 TIES 17X18 (SUTURE)
SUT SILK 3-0 18XBRD TIE 12 (SUTURE) ×1 IMPLANT
SUT SILK 3-0 18XBRD TIE BLK (SUTURE) IMPLANT
SUT VIC AB 3-0 SH 27 (SUTURE) ×1
SUT VIC AB 3-0 SH 27X BRD (SUTURE) ×1 IMPLANT
SUT VICRYL 4-0 PS2 18IN ABS (SUTURE) ×2 IMPLANT
SYR CONTROL 10ML LL (SYRINGE) ×2 IMPLANT
TOWEL OR 17X24 6PK STRL BLUE (TOWEL DISPOSABLE) ×2 IMPLANT
TOWEL OR 17X26 10 PK STRL BLUE (TOWEL DISPOSABLE) ×2 IMPLANT
WATER STERILE IRR 1000ML POUR (IV SOLUTION) ×2 IMPLANT

## 2012-10-28 NOTE — H&P (View-Only) (Signed)
[facility] [place]  Patient name: Alan Meyers MRN: [id number] DOB: [birth year] Sex: male  REASON FOR VISIT: Follow up after percutaneous endovascular aneurysm repair of a 6.6 cm infrarenal abdominal aortic aneurysm.  HPI: Alan Meyers is a 77 y.o. male who was found to have a 6.6 cm infrarenal abdominal aortic aneurysm. He underwent percutaneous endovascular aneurysm repair using the Gore Excluder device on 09/18/2012. He comes in for a one-month follow up visit. Of note, on his preoperative workup he was found to have a carotid bruit and a carotid duplex scan suggested a greater than 80% left carotid stenosis. As this was asymptomatic, and given the size of his aneurysm, I recommended that we proceed with repair of his aneurysm first and then plan on staged left carotid endarterectomy.  He denies any history of previous stroke, TIAs, expressive or receptive aphasia, or amaurosis fugax. He is on aspirin. We have discussed taking a statin on multiple occasions however he is not agreeable to do this.  For VQI Use Only   PRE-ADM LIVING: [facility] ] Home, [ ]  Nursing home, [ ]  Homeless  AMB STATUS: [facility].[place] ] Walking, [ ]  Walking w/ Assistance, [ ]  Wheelchair, [ ] Bed ridden  RECENT HEART ATTACK (<6 mon): No  CAD Sx: [facility] ] No, [ ]  Asx, h/o MI, [ ]  Stable angina, [ ]  Unstable angina  PRIOR CHF: [facility].Keller ] No, [ ]  Asx, [ ]  Mild, [ ]  Moderate, [ ]  Severe  STRESS TEST: [ ]  No, [facility].Keller ] Normal, [ ]  + ischemia, [ ]  + MI, [ ]  Both  (Low risk for stress nuclear study with a small area of inferior basilar wall ischemia. Ejection fraction 51%. Inferiobasal wall hypokinesis.  ASA: yes   Statin: patient refuses  Past Medical History  Diagnosis Date  . AAA (abdominal aortic aneurysm)     6.1 - 6.2cm  . Hemorrhoids   . Hyperlipidemia   . Peripheral neuropathy   . Diarrhea, functional 08/01/12  . Peripheral arterial disease   . Plantar warts 05/29/12  . Pulsatile abdominal mass 08/01/12  . Hypertension      no meds for sev years   Family History  Problem Relation Age of Onset  . Tuberculosis Mother   . Heart disease Father     Valvular heart disease and Congestive heart failure  . Heart attack Father   . Cancer Sister   . COPD Brother   . COPD Brother    SOCIAL HISTORY: History  Substance Use Topics  . Smoking status: Former Smoker -- 1.50 packs/day for 40 years    Types: Cigarettes    Quit date: 02/27/2003  . Smokeless tobacco: Never Used  . Alcohol Use: No   No Known Allergies  Current Outpatient Prescriptions  Medication Sig Dispense Refill  . aspirin 81 MG tablet Take 81 mg by mouth daily.      Marland Kitchen oxyCODONE-acetaminophen (ROXICET) 5-325 MG per tablet Take 1-2 tablets by mouth every 4 (four) hours as needed for pain.  30 tablet  0   No current facility-administered medications for this visit.   REVIEW OF SYSTEMS: [facility] ] denotes positive finding; [  ] denotes negative finding  CARDIOVASCULAR:  [ ]  chest pain   [ ]  chest pressure   [ ]  palpitations   [ ]  orthopnea   [ ]  dyspnea on exertion   [ ]  claudication   [ ]  rest pain   Arly.Keller ] DVT   [ ]  phlebitis PULMONARY:   [ ]   productive cough   [ ]  asthma   [ ]  wheezing NEUROLOGIC:   [ ]  weakness  [ ]  paresthesias  [ ]  aphasia  [ ]  amaurosis  [ ]  dizziness HEMATOLOGIC:   [ ]  bleeding problems   [ ]  clotting disorders MUSCULOSKELETAL:  [ ]  joint pain   [ ]  joint swelling [ ]  leg swelling GASTROINTESTINAL: [ ]   blood in stool  [ ]   hematemesis GENITOURINARY:  [ ]   dysuria  [ ]   hematuria PSYCHIATRIC:  [ ]  history of major depression INTEGUMENTARY:  [ ]  rashes  [ ]  ulcers CONSTITUTIONAL:  [ ]  fever   [ ]  chills  PHYSICAL EXAM: Filed Vitals:   10/22/12 0947  BP: 163/74  Pulse: 66  Resp: 18  Height: 5' 10.5" (1.791 m)  Weight: 156 lb (70.761 kg)   Body mass index is 22.06 kg/(m^2). GENERAL: The patient is a well-nourished male, in no acute distress. The vital signs are documented above. CARDIOVASCULAR: There is a regular rate  and rhythm. He has a left carotid bruit. He has palpable femoral pulses. Both feet are warm and well perfused. PULMONARY: There is good air exchange bilaterally without wheezing or rales. ABDOMEN: Soft and non-tender with normal pitched bowel sounds.  MUSCULOSKELETAL: There are no major deformities or cyanosis. NEUROLOGIC: No focal weakness or paresthesias are detected. SKIN: There are no ulcers or rashes noted. PSYCHIATRIC: The patient has a normal affect.  DATA:  His graft appears to be in good position. I measured the maximum diameter of his aneurysm at 6.6 cm which is thus not changed in size. There is no evidence of endoleak.  His carotid duplex scan from 08/27/12 shows a greater than 80% left internal carotid artery stenosis. Peak systolic velocity is 325 cm/s in the mid internal carotid artery with an end-diastolic velocity of 132 cm/s. There is a less than 40% right internal carotid artery stenosis.  MEDICAL ISSUES: ABDOMINAL AORTIC ANEURYSM: The patient is doing well status post PEVAR. His follow up CT scan looks good without evidence of endoleak and the graft is in good position. I've ordered a follow up CT scan in 6 months.  GREATER THAN 80% left carotid stenosis. I have recommended left carotid endarterectomy. I have reviewed the indications for carotid endarterectomy, that is to lower the risk of future stroke. I have also reviewed the potential complications of surgery, including but not limited to: bleeding, stroke (perioperative risk 1-2%), MI, nerve injury of other unpredictable medical problems. All of the patients questions were answered and they are agreeable to proceed with surgery. The patient is on aspirin and knows to continue his aspirin right up through surgery. We have discussed taking a statin on multiple occasions I have explained that this does lower his risk of perioperative stroke and long for mortality based on several vascular studies. Currently the patient is not  willing to begin taking a statin despite multiple conversations.  His surgery is scheduled for 10/28/12.    DICKSON,CHRISTOPHER S [facility] [place] Beeper: [phone]

## 2012-10-28 NOTE — Transfer of Care (Signed)
Immediate Anesthesia Transfer of Care Note  Patient: Alan Meyers  Procedure(s) Performed: Procedure(s): ENDARTERECTOMY CAROTID- LEFT (Left)  Patient Location: PACU  Anesthesia Type:General  Level of Consciousness: awake, alert  and oriented  Airway & Oxygen Therapy: Patient Spontanous Breathing and Patient connected to nasal cannula oxygen  Post-op Assessment: Report given to PACU RN, Post -op Vital signs reviewed and stable and Patient moving all extremities  Post vital signs: Reviewed and stable  Complications: No apparent anesthesia complications

## 2012-10-28 NOTE — Interval H&P Note (Signed)
History and Physical Interval Note:  10/28/2012 7:01 AM  Alan Meyers  has presented today for surgery, with the diagnosis of Left Internal Carotid Artery Stenosis   The various methods of treatment have been discussed with the patient and family. After consideration of risks, benefits and other options for treatment, the patient has consented to  Procedure(s): ENDARTERECTOMY CAROTID- LEFT (Left) as a surgical intervention .  The patient's history has been reviewed, patient examined, no change in status, stable for surgery.  I have reviewed the patient's chart and labs.  Questions were answered to the patient's satisfaction.     [name] S

## 2012-10-28 NOTE — Anesthesia Postprocedure Evaluation (Signed)
  Anesthesia Post-op Note  Patient: Alan Meyers  Procedure(s) Performed: Procedure(s): ENDARTERECTOMY CAROTID- LEFT (Left)  Patient Location: PACU  Anesthesia Type:General  Level of Consciousness: awake and alert   Airway and Oxygen Therapy: Patient Spontanous Breathing  Post-op Pain: mild  Post-op Assessment: Post-op Vital signs reviewed  Post-op Vital Signs: stable  Complications: No apparent anesthesia complications

## 2012-10-28 NOTE — Progress Notes (Signed)
Utilization review completed.  

## 2012-10-28 NOTE — Anesthesia Procedure Notes (Signed)
Procedure Name: Intubation Date/Time: 10/28/2012 7:36 AM Performed by: Charm Barges, Geet Hosking R Pre-anesthesia Checklist: Patient identified, Emergency Drugs available, Suction available, Patient being monitored and Timeout performed Patient Re-evaluated:Patient Re-evaluated prior to inductionOxygen Delivery Method: Circle system utilized Intubation Type: IV induction Ventilation: Mask ventilation without difficulty Laryngoscope Size: Miller and 3 Grade View: Grade I Tube type: Oral Tube size: 8.0 mm Number of attempts: 1 Airway Equipment and Method: Stylet Placement Confirmation: ETT inserted through vocal cords under direct vision,  positive ETCO2 and breath sounds checked- equal and bilateral Secured at: 22 cm Tube secured with: Tape Dental Injury: Teeth and Oropharynx as per pre-operative assessment

## 2012-10-28 NOTE — Op Note (Signed)
NAME: Alan Meyers   MRN: [id number] DOB: 1936-02-03    DATE OF OPERATION: 10/28/2012  PREOP DIAGNOSIS: asymptomatic greater than 80% left carotid stenosis  POSTOP DIAGNOSIS: same  PROCEDURE: Left carotid endarterectomy with primary closure  SURGEON: Di Kindle. Edilia Bo, MD, FACS  ASSIST: Lianne Cure PA  ANESTHESIA: Gen.   EBL: minimal  INDICATIONS: Alan Meyers is a 77 y.o. male who was seen with a large aneurysm. He was found to have a carotid bruit on preoperative workup and a duplex scan showed a greater than 80% left carotid stenosis. He underwent elective repair of his aneurysm and now presents for staged left carotid endarterectomy for an asymptomatic left carotid stenosis.  FINDINGS: the stenosis was 80% and extended over 3 cm. The artery was very large and therefore elected to close the artery primarily.  TECHNIQUE: The patient was taken to the operating room and received a general anesthetic. Arterial line had been placed by anesthesia. The left neck was prepped and draped in the usual sterile fashion. An incision was made along the anterior border of the sternocleidomastoid and the dissection carried down to the common carotid artery which was dissected free and controlled with a Rummel tourniquet. The facial vein was divided between 2-0 silk ties. The internal carotid artery was controlled above the plaque and the external carotid artery and superior thyroid arteries were controlled. The patient was heparinized. Clamps were then placed on the internal then the external then the common carotid artery. A longitudinal arteriotomy was made in the common carotid artery. This was extended to the plaque into the internal carotid artery. A 12 shunt was placed into the internal carotid artery, backbled and then placed in the common carotid artery and secured with Rummel tourniquet. Flow is reestablished the shunt. An endarterectomy plane was established proximally and the plaque was  sharply divided. Eversion endarterectomy was performed of the external carotid artery. Distally there was nice tapering the plaque and no tacking sutures were required. The artery was irrigated with copious amounts of heparin and dextran all loose debris removed. The artery was very large and I felt that closing this with a patch would result in an aneurysmal carotid. I therefore elected to close this primarily. This was done with running 6-0 Prolene suture. Prior to completing the closure, the shunt was removed, the arteries were backbled and flushed appropriately and the anastomosis completed. Flow was reestablished first to the external carotid artery and into the internal carotid artery. At the completion was a good pulse in the artery distal to the closure and also excellent Doppler signal. The heparin was partially reversed with protamine. Wounds closed the deep layer 3-0 Vicryl and the platysmal was closed with running 3-0 Vicryl. Skin was closed with 4-0 subcuticular stitch. Dermabond was applied. The patient awoke neurologically intact. All needle and sponge counts were correct. The patient was transferred to the recovery room in stable condition.  [name], MD, FACS [facility]  DATE OF DICTATION:   10/28/2012

## 2012-10-28 NOTE — Anesthesia Preprocedure Evaluation (Addendum)
Anesthesia Evaluation  Patient identified by MRN, date of birth, ID band Patient awake    Airway Mallampati: II      Dental  (+) Edentulous Upper and Edentulous Lower   Pulmonary  breath sounds clear to auscultation        Cardiovascular hypertension, + Peripheral Vascular Disease Rhythm:Regular Rate:Normal     Neuro/Psych  Headaches,  Neuromuscular disease    GI/Hepatic   Endo/Other    Renal/GU      Musculoskeletal   Abdominal   Peds  Hematology   Anesthesia Other Findings   Reproductive/Obstetrics                          Anesthesia Physical Anesthesia Plan  ASA: III  Anesthesia Plan: General   Post-op Pain Management:    Induction: Intravenous  Airway Management Planned: Oral ETT  Additional Equipment: Arterial line  Intra-op Plan:   Post-operative Plan: Extubation in OR  Informed Consent: I have reviewed the patients History and Physical, chart, labs and discussed the procedure including the risks, benefits and alternatives for the proposed anesthesia with the patient or authorized representative who has indicated his/her understanding and acceptance.   Dental advisory given  Plan Discussed with: CRNA and Surgeon  Anesthesia Plan Comments:         Anesthesia Quick Evaluation

## 2012-10-28 NOTE — Progress Notes (Signed)
VASCULAR PROGRESS NOTE  SUBJECTIVE: No complaints  PHYSICAL EXAM: Filed Vitals:   10/28/12 1100 10/28/12 1111 10/28/12 1115 10/28/12 1116  BP:  89/45  104/64  Pulse: 53 52 64 56  Temp:      TempSrc:      Resp: 22 17 23 20   SpO2: 100% 100% 100% 100%   Incision looks fine. Neuro intact  ASSESSMENT AND PLAN:  Stable post op. On dopamine for BP Anticipate D/C tomorrow.   Cari Caraway Beeper: [phone] 10/28/2012

## 2012-10-28 NOTE — Preoperative (Signed)
Beta Blockers   Reason not to administer Beta Blockers:Not Applicable 

## 2012-10-29 LAB — BASIC METABOLIC PANEL
CO2: 24 mEq/L (ref 19–32)
Calcium: 8.8 mg/dL (ref 8.4–10.5)
GFR calc non Af Amer: 79 mL/min — ABNORMAL LOW (ref >90)
Glucose, Bld: 122 mg/dL — ABNORMAL HIGH (ref 70–99)
Potassium: 4.3 mEq/L (ref 3.5–5.1)
Sodium: 145 mEq/L (ref 135–145)

## 2012-10-29 LAB — CBC
Hemoglobin: 12.5 g/dL — ABNORMAL LOW (ref 13.0–17.0)
MCH: 29.8 pg (ref 26.0–34.0)
MCHC: 34.2 g/dL (ref 30.0–36.0)
Platelets: 189 10*3/uL (ref 150–400)
RBC: 4.19 MIL/uL — ABNORMAL LOW (ref 4.22–5.81)

## 2012-10-29 MED ORDER — OXYCODONE-ACETAMINOPHEN 5-325 MG PO TABS: 1.0000 | ORAL_TABLET | Freq: Four times a day (QID) | ORAL | Status: DC | PRN

## 2012-10-29 NOTE — Discharge Summary (Signed)
Agree with plans for D/C. [name], MD, FACS Beeper [phone] 10/29/2012

## 2012-10-29 NOTE — Discharge Summary (Signed)
[facility] Discharge Summary   Patient ID:  Alan Meyers MRN: [id number] DOB/AGE: [birth year] 77 y.o.  Admit date: 10/28/2012 Discharge date: 10/29/2012 Date of Surgery: 10/28/2012 Surgeon: Surgeon(s): Chuck Hint, MD  Admission Diagnosis: Left Internal Carotid Artery Stenosis   Discharge Diagnoses:  Left Internal Carotid Artery Stenosis   Secondary Diagnoses: Past Medical History  Diagnosis Date  . AAA (abdominal aortic aneurysm)     6.1 - 6.2cm  . Hemorrhoids   . Hyperlipidemia   . Peripheral neuropathy   . Diarrhea, functional 08/01/12  . Peripheral arterial disease   . Plantar warts 05/29/12  . Pulsatile abdominal mass 08/01/12  . Hypertension     no meds for sev years  . [id number](478.2)     Procedure(s): ENDARTERECTOMY CAROTID- LEFT  Discharged Condition: stable  HPI: Alan Meyers is a 77 y.o. male who was found to have a 6.6 cm infrarenal abdominal aortic aneurysm. He underwent percutaneous endovascular aneurysm repair using the Gore Excluder device on 09/18/2012. He comes in for a one-month follow up visit. Of note, on his preoperative workup he was found to have a carotid bruit and a carotid duplex scan suggested a greater than 80% left carotid stenosis. As this was asymptomatic, and given the size of his aneurysm, I recommended that we proceed with repair of his aneurysm first and then plan on staged left carotid endarterectomy.  He denies any history of previous stroke, TIAs, expressive or receptive aphasia, or amaurosis fugax. He is on aspirin. We have discussed taking a statin on multiple occasions however he is not agreeable to do this.  He was taken to the OR 10/28/2012 and underwent Left carotid endarterectomy with primary closure.  His stay was uneventful.  He is taking PO'S well, ambulating and voided independently.    Hospital Course:  Alan Meyers is a 77 y.o. male is S/P Left Procedure(s): ENDARTERECTOMY CAROTID- LEFT Extubated: POD  # 0 Physical exam: no tongue deviation, palpable radial pulses equal, no facial droop. Post-op wounds clean, dry, intact or healing well Pt. Ambulating, voiding and taking PO diet without difficulty. Pt pain controlled with PO pain meds. Labs as below Complications:none  Consults:     Significant Diagnostic Studies: CBC Lab Results  Component Value Date   WBC 7.3 10/29/2012   HGB 12.5* 10/29/2012   HCT 36.5* 10/29/2012   MCV 87.1 10/29/2012   PLT 189 10/29/2012    BMET    Component Value Date/Time   NA 145 10/29/2012 0415   K 4.3 10/29/2012 0415   CL 111 10/29/2012 0415   CO2 24 10/29/2012 0415   GLUCOSE 122* 10/29/2012 0415   BUN 10 10/29/2012 0415   CREATININE 0.97 10/29/2012 0415   CREATININE 1.00 08/18/2012 1330   CALCIUM 8.8 10/29/2012 0415   GFRNONAA 79* 10/29/2012 0415   GFRAA >90 10/29/2012 0415   COAG Lab Results  Component Value Date   INR 0.92 10/22/2012   INR 1.06 09/18/2012   INR 0.97 09/12/2012     Disposition:  Discharge to :Home Discharge Orders   Future Appointments Provider Department Dept Phone   04/29/2013 9:30 AM [facility] [place] [facility] AT [facility] [phone]   04/29/2013 11:00 AM [facility] 4 [facility] [phone]   04/29/2013 11:30 AM Chuck Hint, MD [facility] [phone]   Future Orders Complete By Expires   Call MD for:  redness, tenderness, or signs of infection (pain, swelling, bleeding, redness, odor  or green/yellow discharge around incision site)  As directed    Call MD for:  severe or increased pain, loss or decreased feeling  in affected limb(s)  As directed    Call MD for:  temperature >100.5  As directed    Driving Restrictions  As directed    Comments:     No driving for 2 weeks   Increase activity slowly  As directed    Comments:     Walk with assistance use walker or cane as needed   Lifting restrictions  As directed    Comments:     No lifting for 6 weeks    May shower   As directed    Resume previous diet  As directed        Medication List         aspirin 81 MG tablet  Take 81 mg by mouth daily.     fish oil-omega-3 fatty acids 1000 MG capsule  Take 1 g by mouth daily.     Lutein 6 MG Tabs  Take 1 tablet by mouth daily.     oxyCODONE-acetaminophen 5-325 MG per tablet  Commonly known as:  ROXICET  Take 1 tablet by mouth every 6 (six) hours as needed for pain.       Verbal and written Discharge instructions given to the patient. Wound care per Discharge AVS   Signed: Clinton Gallant [facility] 10/29/2012, 8:31 AM  --- For VQI Registry use --- Instructions: Press F2 to tab through selections.  Delete question if not applicable.   Modified Rankin score at D/C (0-6): Rankin Score=0  IV medication needed for:  1. Hypertension: No 2. Hypotension: Yes  Post-op Complications: No  1. Post-op CVA or TIA: No  If yes: Event classification (right eye, left eye, right cortical, left cortical, verterobasilar, other):   If yes: Timing of event (intra-op, <6 hrs post-op, >=6 hrs post-op, unknown):   2. CN injury: No  If yes: CN  injuried   3. Myocardial infarction: No  If yes: Dx by (EKG or clinical, Troponin):   4.  CHF: No  5.  Dysrhythmia (new): No  6. Wound infection: No  7. Reperfusion symptoms: No  8. Return to OR: No  If yes: return to OR for (bleeding, neurologic, other CEA incision, other):   Discharge medications: Statin use:  No Non-Compliant ASA use:  Yes Beta blocker use:  No  for medical reason   ACE-Inhibitor use:  No  for medical reason   P2Y12 Antagonist use: [x ] None, [ ]  Plavix, [ ]  Plasugrel, [ ]  Ticlopinine, [ ]  Ticagrelor, [ ]  Other, [ ]  No for medical reason, [ ]  Non-compliant, [ ]  Not-indicated Anti-coagulant use:  [x ] None, [ ]  Warfarin, [ ]  Rivaroxaban, [ ]  Dabigatran, [ ]  Other, [ ]  No for medical reason, [ ]  Non-compliant, [ ]  Not-indicated

## 2012-10-29 NOTE — Progress Notes (Signed)
Pt d/c home per MD order, pt VSS, family at [facility], all questions answered, d/c instructions given, pt and family verbalized understanding of tx.

## 2012-10-29 NOTE — Progress Notes (Signed)
VASCULAR PROGRESS NOTE  SUBJECTIVE: No complaints  PHYSICAL EXAM: Filed Vitals:   10/29/12 0400 10/29/12 0438 10/29/12 0530 10/29/12 0600  BP: 120/52 97/46 106/54 123/54  Pulse: 74 73  55  Temp: 98.1 F (36.7 C)     TempSrc: Oral     Resp: 11 21 14 16   Height:      Weight:      SpO2: 99% 100%  98%   Incision looks fine. Neuro intact  LABS: Lab Results  Component Value Date   WBC 7.3 10/29/2012   HGB 12.5* 10/29/2012   HCT 36.5* 10/29/2012   MCV 87.1 10/29/2012   PLT 189 10/29/2012   Lab Results  Component Value Date   CREATININE 0.97 10/29/2012   Lab Results  Component Value Date   INR 0.92 10/22/2012   CBG (last 3)  No results found for this basename: GLUCAP,  in the last 72 hours  ASSESSMENT AND PLAN:  * 1 Day Post-Op s/p: left CEA * D/C today. Continue ASA * F/U 2-3 weeks  Cari Caraway Beeper: [phone] 10/29/2012

## 2012-10-30 ENCOUNTER — Encounter (HOSPITAL_COMMUNITY): Payer: Self-pay | Admitting: Vascular Surgery

## 2013-03-26 ENCOUNTER — Other Ambulatory Visit: Payer: Self-pay | Admitting: Vascular Surgery

## 2013-03-26 DIAGNOSIS — I714 Abdominal aortic aneurysm, without rupture, unspecified: Secondary | ICD-10-CM

## 2013-03-26 DIAGNOSIS — I739 Peripheral vascular disease, unspecified: Secondary | ICD-10-CM

## 2013-03-26 DIAGNOSIS — Z48812 Encounter for surgical aftercare following surgery on the circulatory system: Secondary | ICD-10-CM

## 2013-04-06 ENCOUNTER — Telehealth: Payer: Self-pay | Admitting: Vascular Surgery

## 2013-04-06 NOTE — Telephone Encounter (Addendum)
Message copied by Fredrich BirksMILLIKAN, DANA P on Mon Apr 06, 2013  4:27 PM ------      Message from: Cecilio AsperHOMAS, DEBORAH S      Created: Mon Apr 06, 2013  2:04 PM       Please call patient.  He wants to cancel his appointments on 04/29/13.            Thanks,            Debbie       ------  04/06/13: pt does not wish to reschedule due to insurance purposes and the amount out of pocket he will have to pay. He canceled our appointment as well as a CTA. Will route to clinical and Jen.

## 2013-04-29 ENCOUNTER — Ambulatory Visit: Payer: Medicare Other | Admitting: Vascular Surgery

## 2013-04-29 ENCOUNTER — Inpatient Hospital Stay: Admission: RE | Admit: 2013-04-29 | Payer: Medicare Other | Source: Ambulatory Visit

## 2013-04-29 ENCOUNTER — Encounter (HOSPITAL_COMMUNITY): Payer: Medicare Other

## 2013-12-24 IMAGING — CT CT CTA ABD/PEL W/CM AND/OR W/O CM
1 of 7 series · 12 of 36 positions shown, 17 images · IV contrast (80CC OMNI 350)
Comparison: None.

CLINICAL DATA: Abdominal aortic aneurysm.  Pre stent evaluation.

CT ANGIOGRAPHY ABDOMEN AND PELVIS
TECHNIQUE: Multidetector CT imaging of the abdomen and pelvis was
performed using the standard protocol during bolus administration
of intravenous contrast.  Multiplanar reconstructed images
including MIPs were obtained and reviewed to evaluate the vascular
anatomy.
Contrast: 80mL OMNIPAQUE IOHEXOL 350 MG/ML SOLN

[Series 4: angio · axial · 0.78mm/px · z∈[-396,-48]mm · 12 of 163 slices shown, 17 images]
[im 12/163  soft-tissue]
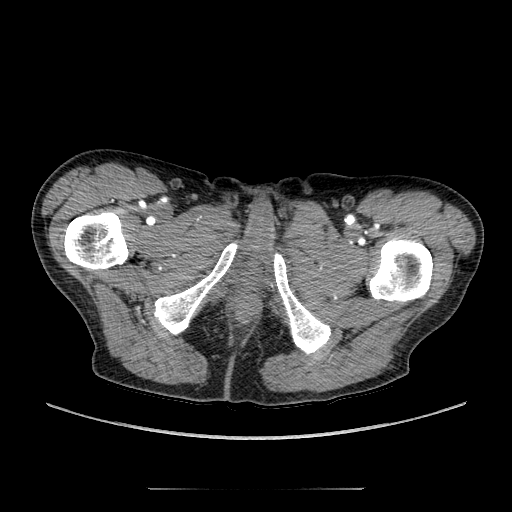
[im 12/163  bone]
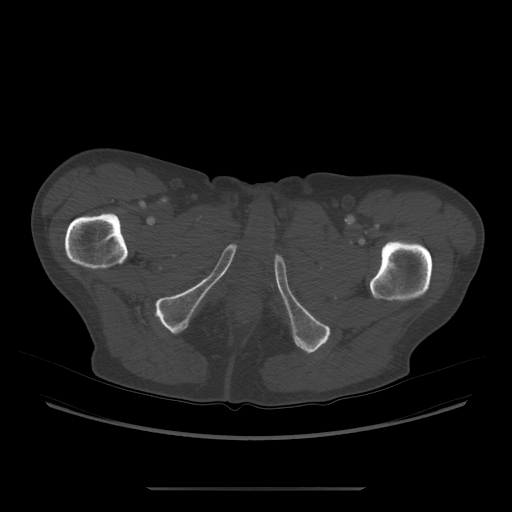
[im 24/163  soft-tissue]
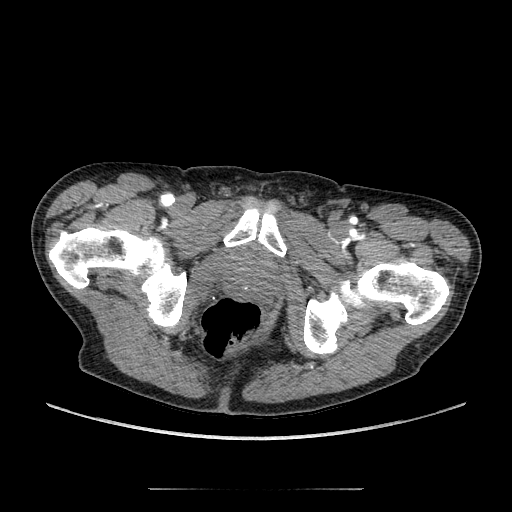
[im 35/163  soft-tissue]
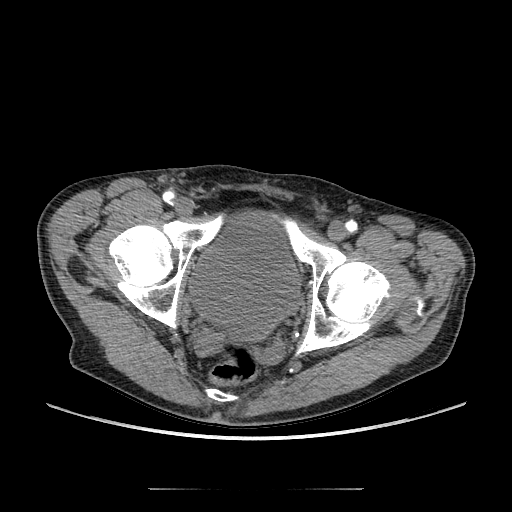
[im 58/163  soft-tissue]
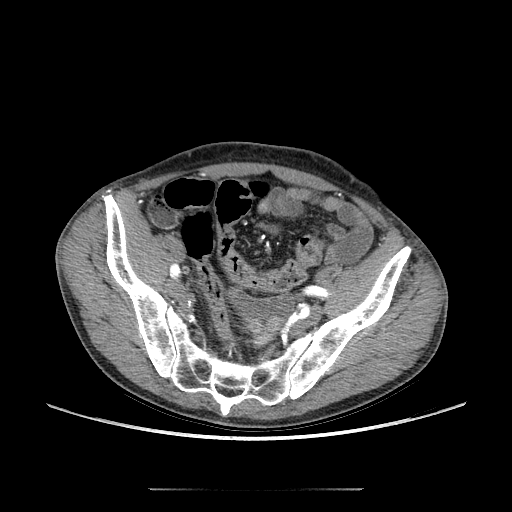
[im 70/163  soft-tissue]
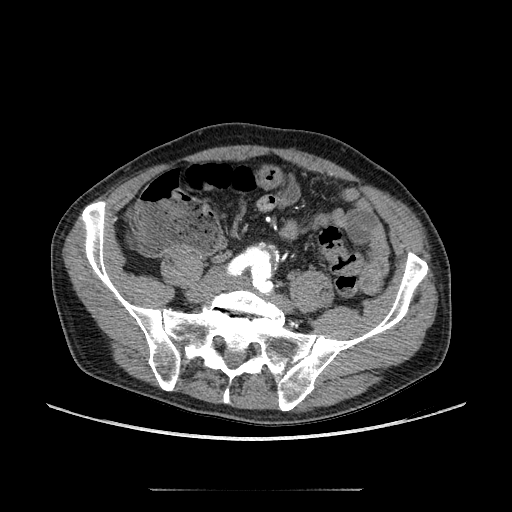
[im 82/163  soft-tissue]
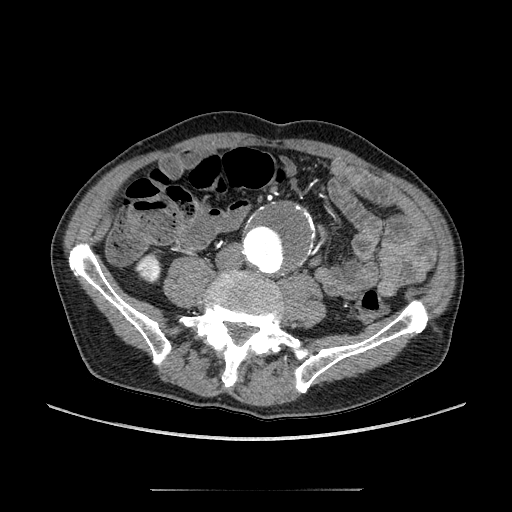
[im 93/163  soft-tissue]
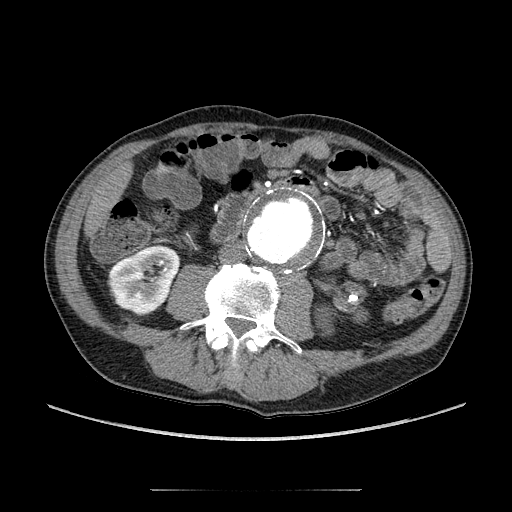
[im 105/163  soft-tissue]
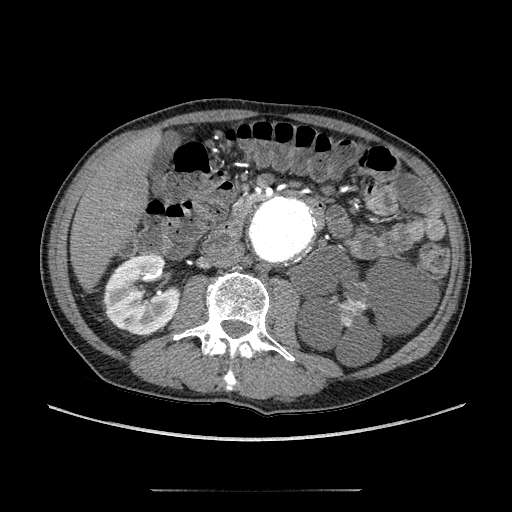
[im 116/163  lung]
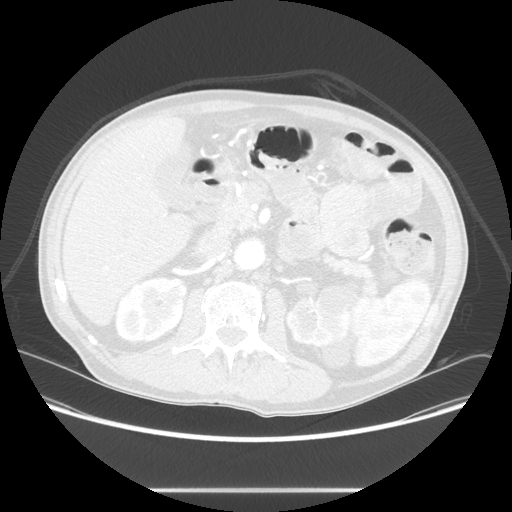
[im 128/163  soft-tissue]
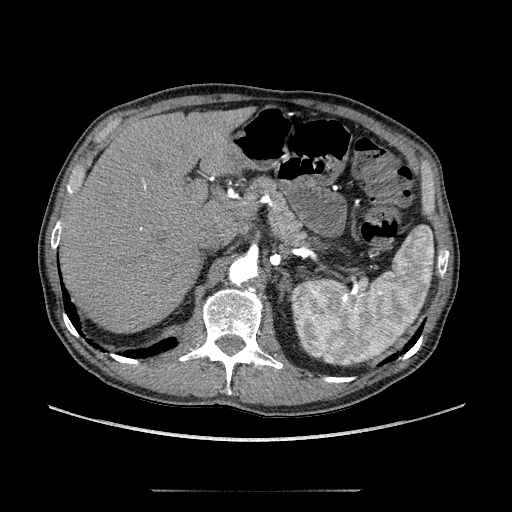
[im 128/163  lung]
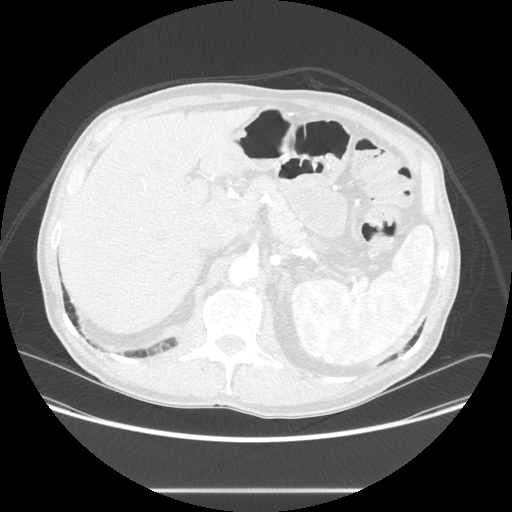
[im 128/163  bone]
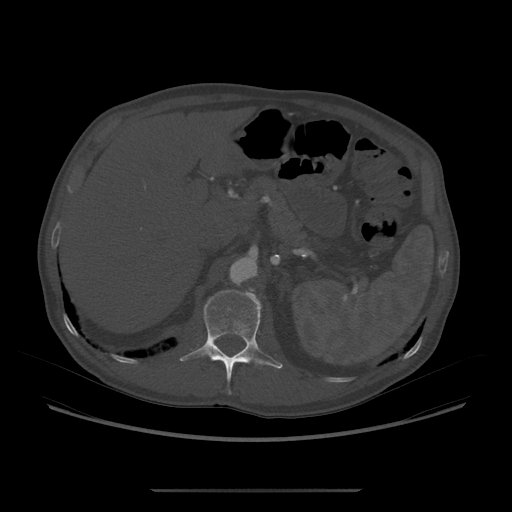
[im 139/163  soft-tissue]
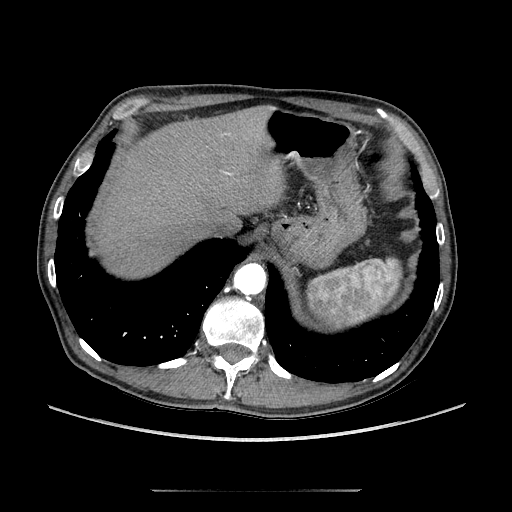
[im 139/163  lung]
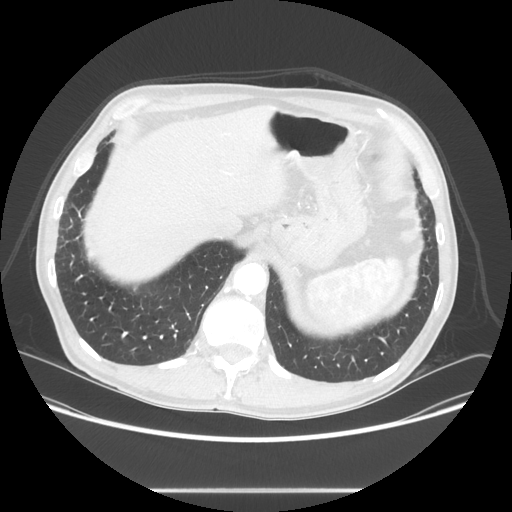
[im 151/163  soft-tissue]
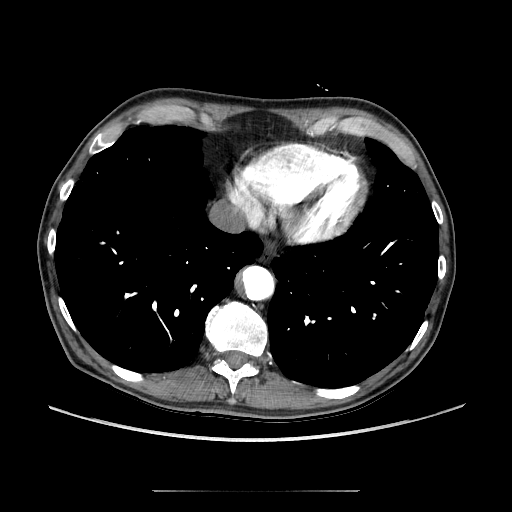
[im 151/163  lung]
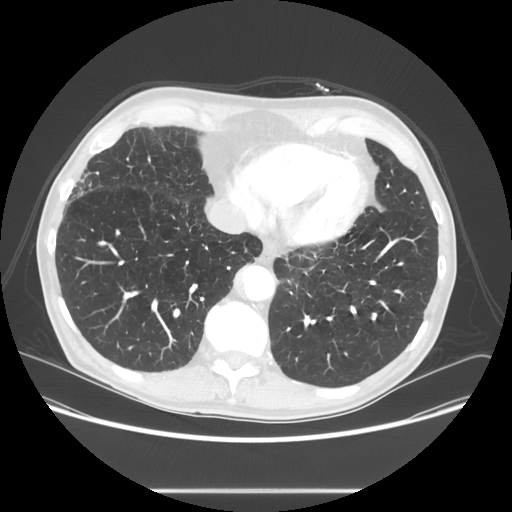

[12 of 36 positions shown; findings below may reference images not displayed]

Arterial findings:
Aorta:                  There is mural thrombus throughout the
abdominal aorta.  Abdominal aorta is patent.  There is a large
infrarenal abdominal aortic aneurysm that measures 6.8 x 6.5 cm on
sequence #4, image 69.  Aneurysm terminates at the aortic
bifurcation.  Greater than 50% of the aneurysm contains mural
thrombus on image 79.  The proximal aneurysm neck is very irregular
with outpouchings.  The findings are suggestive for penetrating
ulcerations in this area.  No evidence for aortic dissection.

Celiac axis:            ?Narrowing at the origin which is probably
associated with the median arcuate ligament.  The celiac trunk
branches are patent.

Superior mesenteric:Patent with a small amount of nonocclusive
thrombus in the SMA.

Left renal:             Single left renal artery is patent and
difficult to exclude narrowing in the proximal aspect.

Right renal:            Main right renal artery is patent.  There
is an accessory inferior right renal artery that originates along
the anterior aspect the aorta near the 11 o'clock position.

Inferior mesenteric:There is flow in the inferior mesenteric
artery. However, the origin of the inferior mesenteric artery
appears to be occluded from mural thrombus within the aneurysm.

Left iliac:             Proximal left common iliac artery is
tortuous.  The distal common iliac artery measures 1.4 cm. The
internal and external iliac arteries are patent.  The left external
iliac artery measures 8 mm.  Proximal left femoral arteries are
patent.

Right iliac:            Right common iliac artery measures 1.3 cm.
Mild thrombus in the common iliac artery.  The origin of the right
internal iliac artery is occluded.  The right external iliac artery
roughly measures 8 mm.  Mild atherosclerotic plaque in the proximal
right femoral arteries.  Narrowing at the origin of the right SFA.

Venous findings:  The inferior vena cava is patent.  There is a
retroaortic left renal vein which is patent.

 Review of the MIP images confirms the above findings.

Nonvascular findings: There are emphysematous changes at the lung
bases. Left kidney is atrophic and contains multiple cysts. In
addition, there appears to be at least four stones in the left
kidney mid and lower pole region.  Largest stone roughly measures 6
mm.  Sub centimeter low density structures in the right hepatic
lobe, large measures 8 mm.  These findings are nonspecific.  No
gross abnormality to the gallbladder, spleen or pancreas.  No gross
abnormality to the adrenal glands.  There are small peripheral
cortical defects in the right kidneys.  Findings could represent
previous infarcts of unknown age.  Probable 3 mm stone in the right
kidney which is nonobstructive.

No gross abnormality to the prostate, bladder or seminal vesicles.
There is fluid in the urinary bladder.  No significant free fluid
or lymphadenopathy.  Degenerative changes at L5-S1.
IMPRESSION: Infrarenal abdominal aortic aneurysm measuring up to 6.8 cm.

Irregularity of the aneurysm neck is suggestive for penetrating
ulcerations.

Atrophy of the left kidney with multiple cysts and calcifications.

Peripheral defects within the right kidney may represent multiple
small infarcts of unknown age.

Emphysematous changes at the lung bases.

Origin of the right internal iliac artery is occluded.

## 2014-01-09 IMAGING — CR DG CHEST 2V
2 series · 2 of 2 positions shown · non-contrast
Comparison: None

CLINICAL DATA: Preoperative evaluation, former smoker, history
hypertension, abdominal aortic aneurysm

CHEST - 2 VIEW

[w chest pa]
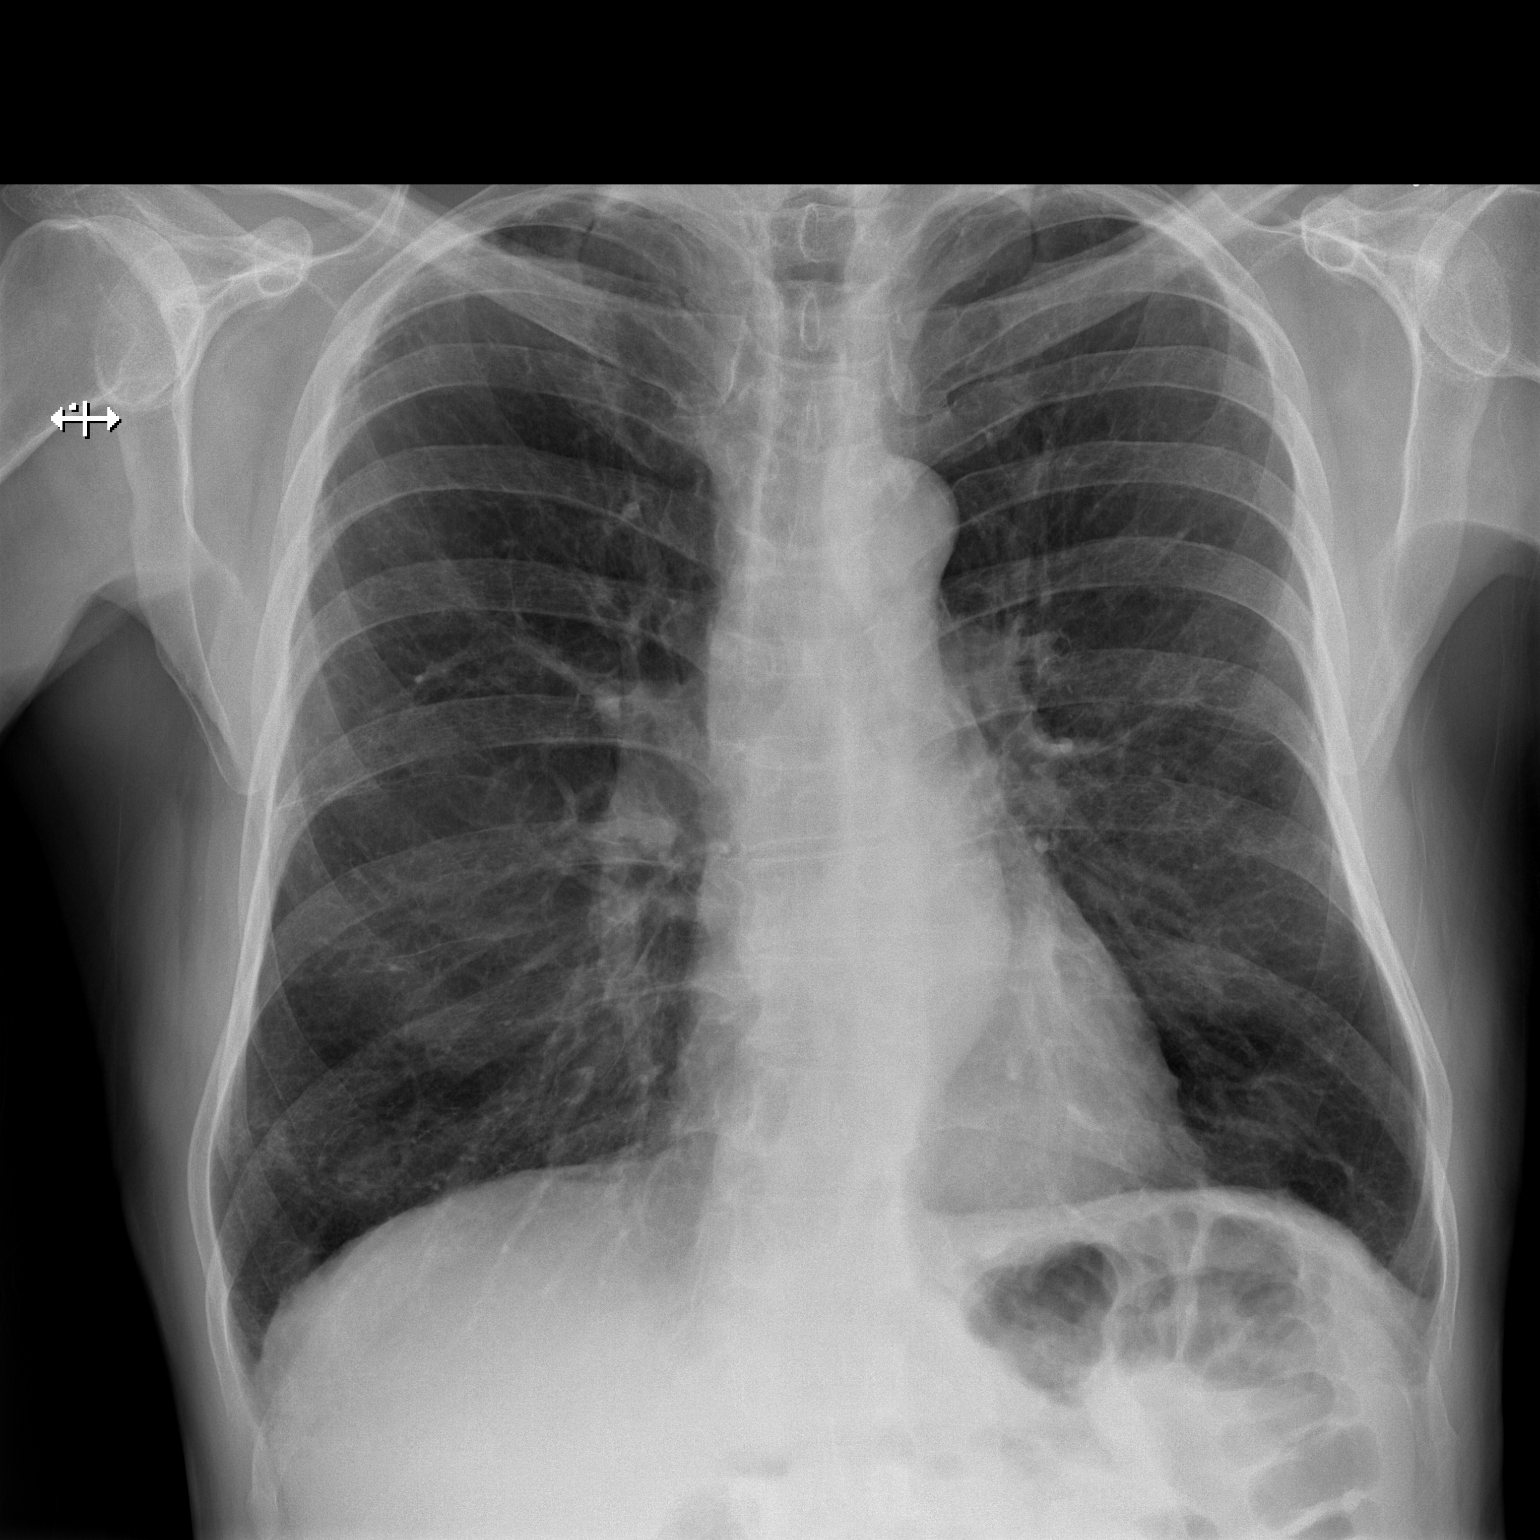

[w chest lat]
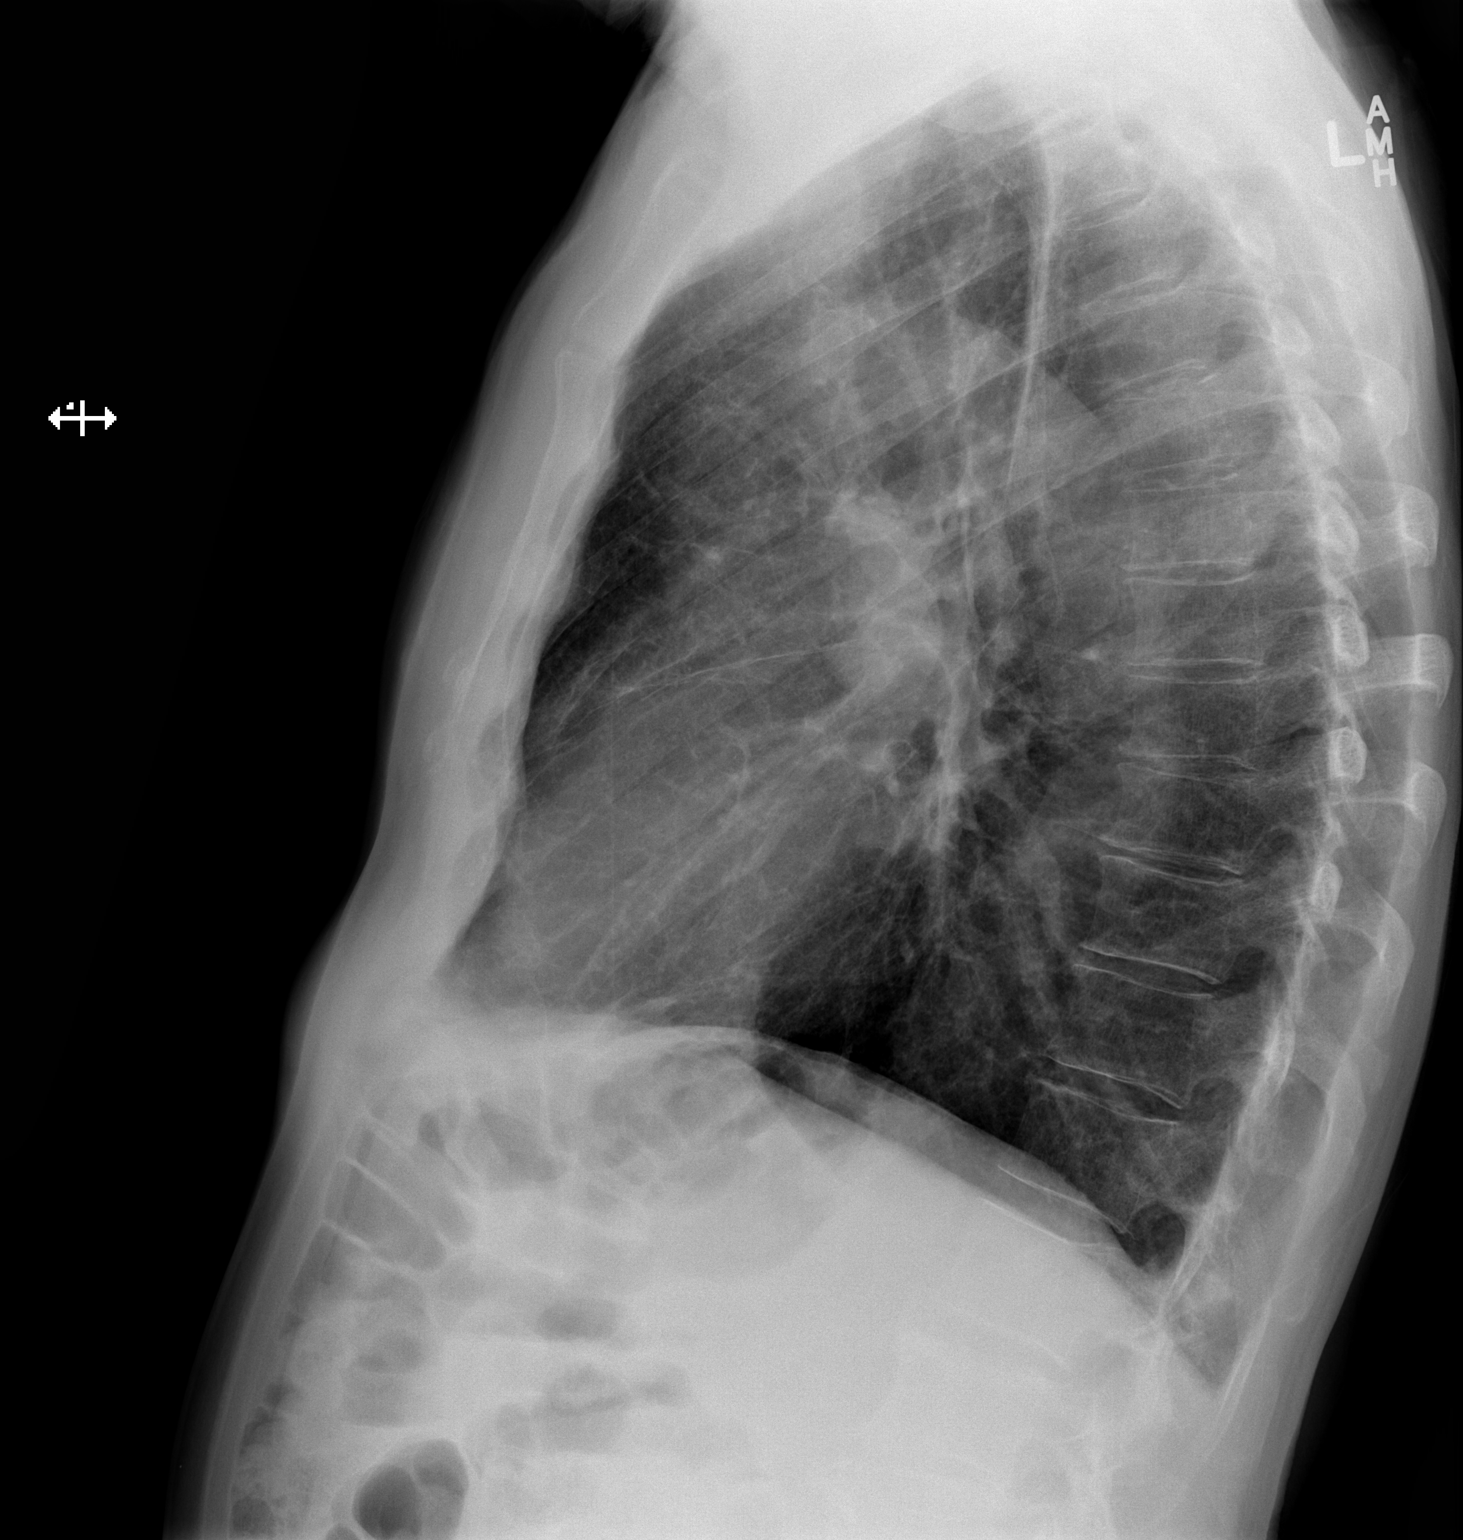

[2 of 2 positions shown; findings below may reference images not displayed]

FINDINGS: Normal heart size and pulmonary vascularity.
Tortuous thoracic aorta.
Emphysematous and minimal bronchitic changes consistent with COPD.
No acute infiltrate, pleural effusion or pneumothorax.
Question minimal atelectasis or scarring at right base.
Bones unremarkable.
IMPRESSION: COPD changes with minimal atelectasis or scarring at right base.
No acute abnormalities.

## 2014-02-04 ENCOUNTER — Encounter (HOSPITAL_COMMUNITY): Payer: Self-pay | Admitting: Vascular Surgery

## 2016-05-28 DIAGNOSIS — I7090 Unspecified atherosclerosis: Secondary | ICD-10-CM | POA: Diagnosis not present

## 2016-05-28 DIAGNOSIS — I714 Abdominal aortic aneurysm, without rupture: Secondary | ICD-10-CM | POA: Diagnosis not present

## 2016-05-28 DIAGNOSIS — E7800 Pure hypercholesterolemia, unspecified: Secondary | ICD-10-CM | POA: Diagnosis not present

## 2016-05-28 DIAGNOSIS — I10 Essential (primary) hypertension: Secondary | ICD-10-CM | POA: Diagnosis not present

## 2016-05-30 DIAGNOSIS — Z0000 Encounter for general adult medical examination without abnormal findings: Secondary | ICD-10-CM | POA: Diagnosis not present

## 2016-05-30 DIAGNOSIS — I10 Essential (primary) hypertension: Secondary | ICD-10-CM | POA: Diagnosis not present

## 2016-05-30 DIAGNOSIS — E7800 Pure hypercholesterolemia, unspecified: Secondary | ICD-10-CM | POA: Diagnosis not present

## 2016-05-30 DIAGNOSIS — I6529 Occlusion and stenosis of unspecified carotid artery: Secondary | ICD-10-CM | POA: Diagnosis not present

## 2016-05-30 DIAGNOSIS — I7090 Unspecified atherosclerosis: Secondary | ICD-10-CM | POA: Diagnosis not present

## 2016-05-30 DIAGNOSIS — Z6824 Body mass index (BMI) 24.0-24.9, adult: Secondary | ICD-10-CM | POA: Diagnosis not present

## 2016-05-30 DIAGNOSIS — J301 Allergic rhinitis due to pollen: Secondary | ICD-10-CM | POA: Diagnosis not present

## 2016-05-30 DIAGNOSIS — I714 Abdominal aortic aneurysm, without rupture: Secondary | ICD-10-CM | POA: Diagnosis not present

## 2016-11-30 DIAGNOSIS — E7800 Pure hypercholesterolemia, unspecified: Secondary | ICD-10-CM | POA: Diagnosis not present

## 2016-11-30 DIAGNOSIS — I10 Essential (primary) hypertension: Secondary | ICD-10-CM | POA: Diagnosis not present

## 2016-12-05 DIAGNOSIS — R05 Cough: Secondary | ICD-10-CM | POA: Diagnosis not present

## 2016-12-05 DIAGNOSIS — Z23 Encounter for immunization: Secondary | ICD-10-CM | POA: Diagnosis not present

## 2016-12-05 DIAGNOSIS — I714 Abdominal aortic aneurysm, without rupture: Secondary | ICD-10-CM | POA: Diagnosis not present

## 2016-12-05 DIAGNOSIS — E7800 Pure hypercholesterolemia, unspecified: Secondary | ICD-10-CM | POA: Diagnosis not present

## 2016-12-05 DIAGNOSIS — R634 Abnormal weight loss: Secondary | ICD-10-CM | POA: Diagnosis not present

## 2016-12-05 DIAGNOSIS — I6529 Occlusion and stenosis of unspecified carotid artery: Secondary | ICD-10-CM | POA: Diagnosis not present

## 2016-12-05 DIAGNOSIS — I7090 Unspecified atherosclerosis: Secondary | ICD-10-CM | POA: Diagnosis not present

## 2016-12-05 DIAGNOSIS — I10 Essential (primary) hypertension: Secondary | ICD-10-CM | POA: Diagnosis not present

## 2017-12-03 DIAGNOSIS — I10 Essential (primary) hypertension: Secondary | ICD-10-CM | POA: Diagnosis not present

## 2017-12-03 DIAGNOSIS — I7090 Unspecified atherosclerosis: Secondary | ICD-10-CM | POA: Diagnosis not present

## 2017-12-03 DIAGNOSIS — E7800 Pure hypercholesterolemia, unspecified: Secondary | ICD-10-CM | POA: Diagnosis not present

## 2017-12-03 DIAGNOSIS — I714 Abdominal aortic aneurysm, without rupture: Secondary | ICD-10-CM | POA: Diagnosis not present

## 2017-12-03 DIAGNOSIS — R634 Abnormal weight loss: Secondary | ICD-10-CM | POA: Diagnosis not present

## 2017-12-06 DIAGNOSIS — Z1389 Encounter for screening for other disorder: Secondary | ICD-10-CM | POA: Diagnosis not present

## 2017-12-06 DIAGNOSIS — I714 Abdominal aortic aneurysm, without rupture: Secondary | ICD-10-CM | POA: Diagnosis not present

## 2017-12-06 DIAGNOSIS — I6529 Occlusion and stenosis of unspecified carotid artery: Secondary | ICD-10-CM | POA: Diagnosis not present

## 2017-12-06 DIAGNOSIS — Z1331 Encounter for screening for depression: Secondary | ICD-10-CM | POA: Diagnosis not present

## 2017-12-06 DIAGNOSIS — E7800 Pure hypercholesterolemia, unspecified: Secondary | ICD-10-CM | POA: Diagnosis not present

## 2017-12-06 DIAGNOSIS — N183 Chronic kidney disease, stage 3 (moderate): Secondary | ICD-10-CM | POA: Diagnosis not present

## 2017-12-06 DIAGNOSIS — I10 Essential (primary) hypertension: Secondary | ICD-10-CM | POA: Diagnosis not present

## 2017-12-06 DIAGNOSIS — Z23 Encounter for immunization: Secondary | ICD-10-CM | POA: Diagnosis not present

## 2017-12-25 DIAGNOSIS — H524 Presbyopia: Secondary | ICD-10-CM | POA: Diagnosis not present

## 2017-12-25 DIAGNOSIS — Z0100 Encounter for examination of eyes and vision without abnormal findings: Secondary | ICD-10-CM | POA: Diagnosis not present

## 2018-12-01 DIAGNOSIS — R69 Illness, unspecified: Secondary | ICD-10-CM | POA: Diagnosis not present

## 2018-12-01 DIAGNOSIS — D519 Vitamin B12 deficiency anemia, unspecified: Secondary | ICD-10-CM | POA: Diagnosis not present

## 2018-12-01 DIAGNOSIS — I7090 Unspecified atherosclerosis: Secondary | ICD-10-CM | POA: Diagnosis not present

## 2018-12-01 DIAGNOSIS — R5383 Other fatigue: Secondary | ICD-10-CM | POA: Diagnosis not present

## 2018-12-01 DIAGNOSIS — Z23 Encounter for immunization: Secondary | ICD-10-CM | POA: Diagnosis not present

## 2018-12-01 DIAGNOSIS — I10 Essential (primary) hypertension: Secondary | ICD-10-CM | POA: Diagnosis not present

## 2018-12-01 DIAGNOSIS — E7800 Pure hypercholesterolemia, unspecified: Secondary | ICD-10-CM | POA: Diagnosis not present

## 2018-12-05 DIAGNOSIS — I714 Abdominal aortic aneurysm, without rupture: Secondary | ICD-10-CM | POA: Diagnosis not present

## 2018-12-05 DIAGNOSIS — I6529 Occlusion and stenosis of unspecified carotid artery: Secondary | ICD-10-CM | POA: Diagnosis not present

## 2018-12-05 DIAGNOSIS — N189 Chronic kidney disease, unspecified: Secondary | ICD-10-CM | POA: Diagnosis not present

## 2018-12-05 DIAGNOSIS — G629 Polyneuropathy, unspecified: Secondary | ICD-10-CM | POA: Diagnosis not present

## 2018-12-05 DIAGNOSIS — I10 Essential (primary) hypertension: Secondary | ICD-10-CM | POA: Diagnosis not present

## 2018-12-05 DIAGNOSIS — N401 Enlarged prostate with lower urinary tract symptoms: Secondary | ICD-10-CM | POA: Diagnosis not present

## 2018-12-05 DIAGNOSIS — M6530 Trigger finger, unspecified finger: Secondary | ICD-10-CM | POA: Diagnosis not present

## 2018-12-05 DIAGNOSIS — Z6821 Body mass index (BMI) 21.0-21.9, adult: Secondary | ICD-10-CM | POA: Diagnosis not present

## 2019-12-03 DIAGNOSIS — I10 Essential (primary) hypertension: Secondary | ICD-10-CM | POA: Diagnosis not present

## 2019-12-03 DIAGNOSIS — N183 Chronic kidney disease, stage 3 unspecified: Secondary | ICD-10-CM | POA: Diagnosis not present

## 2019-12-03 DIAGNOSIS — N289 Disorder of kidney and ureter, unspecified: Secondary | ICD-10-CM | POA: Diagnosis not present

## 2019-12-03 DIAGNOSIS — E7800 Pure hypercholesterolemia, unspecified: Secondary | ICD-10-CM | POA: Diagnosis not present

## 2019-12-07 DIAGNOSIS — N138 Other obstructive and reflux uropathy: Secondary | ICD-10-CM | POA: Diagnosis not present

## 2019-12-07 DIAGNOSIS — I10 Essential (primary) hypertension: Secondary | ICD-10-CM | POA: Diagnosis not present

## 2019-12-07 DIAGNOSIS — M6530 Trigger finger, unspecified finger: Secondary | ICD-10-CM | POA: Diagnosis not present

## 2019-12-07 DIAGNOSIS — Z0000 Encounter for general adult medical examination without abnormal findings: Secondary | ICD-10-CM | POA: Diagnosis not present

## 2019-12-07 DIAGNOSIS — N401 Enlarged prostate with lower urinary tract symptoms: Secondary | ICD-10-CM | POA: Diagnosis not present

## 2019-12-07 DIAGNOSIS — I714 Abdominal aortic aneurysm, without rupture: Secondary | ICD-10-CM | POA: Diagnosis not present

## 2020-04-11 DIAGNOSIS — H524 Presbyopia: Secondary | ICD-10-CM | POA: Diagnosis not present

## 2020-04-11 DIAGNOSIS — Z0100 Encounter for examination of eyes and vision without abnormal findings: Secondary | ICD-10-CM | POA: Diagnosis not present

## 2020-06-28 DIAGNOSIS — R159 Full incontinence of feces: Secondary | ICD-10-CM | POA: Diagnosis not present

## 2020-12-08 DIAGNOSIS — E7801 Familial hypercholesterolemia: Secondary | ICD-10-CM | POA: Diagnosis not present

## 2020-12-08 DIAGNOSIS — I10 Essential (primary) hypertension: Secondary | ICD-10-CM | POA: Diagnosis not present

## 2020-12-08 DIAGNOSIS — E7800 Pure hypercholesterolemia, unspecified: Secondary | ICD-10-CM | POA: Diagnosis not present

## 2020-12-08 DIAGNOSIS — N183 Chronic kidney disease, stage 3 unspecified: Secondary | ICD-10-CM | POA: Diagnosis not present

## 2020-12-12 DIAGNOSIS — E7801 Familial hypercholesterolemia: Secondary | ICD-10-CM | POA: Diagnosis not present

## 2020-12-12 DIAGNOSIS — I10 Essential (primary) hypertension: Secondary | ICD-10-CM | POA: Diagnosis not present

## 2020-12-12 DIAGNOSIS — Z23 Encounter for immunization: Secondary | ICD-10-CM | POA: Diagnosis not present

## 2020-12-12 DIAGNOSIS — I6529 Occlusion and stenosis of unspecified carotid artery: Secondary | ICD-10-CM | POA: Diagnosis not present

## 2020-12-12 DIAGNOSIS — Z0001 Encounter for general adult medical examination with abnormal findings: Secondary | ICD-10-CM | POA: Diagnosis not present

## 2020-12-12 DIAGNOSIS — R198 Other specified symptoms and signs involving the digestive system and abdomen: Secondary | ICD-10-CM | POA: Diagnosis not present

## 2020-12-12 DIAGNOSIS — R69 Illness, unspecified: Secondary | ICD-10-CM | POA: Diagnosis not present

## 2021-09-20 DIAGNOSIS — R413 Other amnesia: Secondary | ICD-10-CM | POA: Diagnosis not present

## 2021-09-20 DIAGNOSIS — Z6820 Body mass index (BMI) 20.0-20.9, adult: Secondary | ICD-10-CM | POA: Diagnosis not present

## 2021-09-20 DIAGNOSIS — R030 Elevated blood-pressure reading, without diagnosis of hypertension: Secondary | ICD-10-CM | POA: Diagnosis not present

## 2021-10-04 DIAGNOSIS — Z1329 Encounter for screening for other suspected endocrine disorder: Secondary | ICD-10-CM | POA: Diagnosis not present

## 2021-10-04 DIAGNOSIS — Z131 Encounter for screening for diabetes mellitus: Secondary | ICD-10-CM | POA: Diagnosis not present

## 2021-10-04 DIAGNOSIS — E7801 Familial hypercholesterolemia: Secondary | ICD-10-CM | POA: Diagnosis not present

## 2021-10-04 DIAGNOSIS — R5383 Other fatigue: Secondary | ICD-10-CM | POA: Diagnosis not present

## 2021-10-04 DIAGNOSIS — I10 Essential (primary) hypertension: Secondary | ICD-10-CM | POA: Diagnosis not present

## 2021-10-04 DIAGNOSIS — N183 Chronic kidney disease, stage 3 unspecified: Secondary | ICD-10-CM | POA: Diagnosis not present

## 2021-10-04 DIAGNOSIS — E7800 Pure hypercholesterolemia, unspecified: Secondary | ICD-10-CM | POA: Diagnosis not present

## 2021-10-04 DIAGNOSIS — Z13220 Encounter for screening for lipoid disorders: Secondary | ICD-10-CM | POA: Diagnosis not present

## 2021-10-24 DIAGNOSIS — R030 Elevated blood-pressure reading, without diagnosis of hypertension: Secondary | ICD-10-CM | POA: Diagnosis not present

## 2021-10-24 DIAGNOSIS — D649 Anemia, unspecified: Secondary | ICD-10-CM | POA: Diagnosis not present

## 2021-10-24 DIAGNOSIS — R413 Other amnesia: Secondary | ICD-10-CM | POA: Diagnosis not present

## 2021-10-24 DIAGNOSIS — Z6821 Body mass index (BMI) 21.0-21.9, adult: Secondary | ICD-10-CM | POA: Diagnosis not present

## 2021-10-27 DIAGNOSIS — Z87891 Personal history of nicotine dependence: Secondary | ICD-10-CM | POA: Diagnosis not present

## 2021-10-27 DIAGNOSIS — J439 Emphysema, unspecified: Secondary | ICD-10-CM | POA: Diagnosis not present

## 2021-10-27 DIAGNOSIS — I6381 Other cerebral infarction due to occlusion or stenosis of small artery: Secondary | ICD-10-CM | POA: Diagnosis not present

## 2021-10-27 DIAGNOSIS — I700 Atherosclerosis of aorta: Secondary | ICD-10-CM | POA: Diagnosis not present

## 2021-10-27 DIAGNOSIS — R634 Abnormal weight loss: Secondary | ICD-10-CM | POA: Diagnosis not present

## 2021-10-27 DIAGNOSIS — R413 Other amnesia: Secondary | ICD-10-CM | POA: Diagnosis not present

## 2021-11-06 DIAGNOSIS — D500 Iron deficiency anemia secondary to blood loss (chronic): Secondary | ICD-10-CM | POA: Diagnosis present

## 2021-11-24 DIAGNOSIS — K2950 Unspecified chronic gastritis without bleeding: Secondary | ICD-10-CM | POA: Diagnosis not present

## 2021-11-24 DIAGNOSIS — B9681 Helicobacter pylori [H. pylori] as the cause of diseases classified elsewhere: Secondary | ICD-10-CM | POA: Diagnosis not present

## 2021-11-24 DIAGNOSIS — D500 Iron deficiency anemia secondary to blood loss (chronic): Secondary | ICD-10-CM | POA: Diagnosis not present

## 2021-11-24 DIAGNOSIS — J449 Chronic obstructive pulmonary disease, unspecified: Secondary | ICD-10-CM | POA: Diagnosis not present

## 2021-11-24 DIAGNOSIS — I10 Essential (primary) hypertension: Secondary | ICD-10-CM | POA: Diagnosis not present

## 2021-11-24 DIAGNOSIS — Z87891 Personal history of nicotine dependence: Secondary | ICD-10-CM | POA: Diagnosis not present

## 2021-11-24 DIAGNOSIS — D509 Iron deficiency anemia, unspecified: Secondary | ICD-10-CM | POA: Diagnosis not present

## 2021-11-24 DIAGNOSIS — K5790 Diverticulosis of intestine, part unspecified, without perforation or abscess without bleeding: Secondary | ICD-10-CM | POA: Diagnosis not present

## 2021-11-24 DIAGNOSIS — K5730 Diverticulosis of large intestine without perforation or abscess without bleeding: Secondary | ICD-10-CM | POA: Diagnosis not present

## 2021-11-24 DIAGNOSIS — K2970 Gastritis, unspecified, without bleeding: Secondary | ICD-10-CM | POA: Diagnosis not present

## 2021-11-24 DIAGNOSIS — E785 Hyperlipidemia, unspecified: Secondary | ICD-10-CM | POA: Diagnosis not present

## 2021-11-24 DIAGNOSIS — Z79899 Other long term (current) drug therapy: Secondary | ICD-10-CM | POA: Diagnosis not present

## 2021-11-24 DIAGNOSIS — K449 Diaphragmatic hernia without obstruction or gangrene: Secondary | ICD-10-CM | POA: Diagnosis not present

## 2021-11-24 DIAGNOSIS — D649 Anemia, unspecified: Secondary | ICD-10-CM | POA: Diagnosis not present

## 2021-11-27 ENCOUNTER — Encounter: Payer: Self-pay | Admitting: *Deleted

## 2021-11-28 ENCOUNTER — Ambulatory Visit: Payer: Medicare HMO | Admitting: Psychiatry

## 2021-11-28 VITALS — BP 145/64 | HR 68 | Wt 135.4 lb

## 2021-11-28 DIAGNOSIS — R69 Illness, unspecified: Secondary | ICD-10-CM | POA: Diagnosis not present

## 2021-11-28 DIAGNOSIS — F03C0 Unspecified dementia, severe, without behavioral disturbance, psychotic disturbance, mood disturbance, and anxiety: Secondary | ICD-10-CM

## 2021-11-28 DIAGNOSIS — R413 Other amnesia: Secondary | ICD-10-CM

## 2021-11-28 MED ORDER — DONEPEZIL HCL 10 MG PO TABS: 10.0000 mg | ORAL_TABLET | Freq: Every day | ORAL | 6 refills | Status: DC

## 2021-11-28 MED ORDER — DONEPEZIL HCL 5 MG PO TABS: 5.0000 mg | ORAL_TABLET | Freq: Every day | ORAL | 0 refills | Status: DC

## 2021-11-28 NOTE — Patient Instructions (Addendum)
Take baby aspirin (81 mg) daily for stroke prevention  I also recommend you take a daily cholesterol medication for stroke prevention. Let me know if you decide to take this and I will send in a prescription for you  Blood work to check thyroid levels  MRI of the brain  Donepezil We recommended that you take Donepezil (Aricept) 67m tab once a day for the first 4 weeks. Then increase the dose to 110monce per day. It is better to take Donepezil with breakfast or lunch. Aricept is well tolerated, although some people may experience nausea, diarrhea, not sleeping well, vomiting, muscle cramps, feeling tired, or not wanting to eat. These side effects were usually mild and temporary. If symptoms continue or become severe, you should stop the medication and contact [place]          Tasks to improve attention/working memory 1. Good sleep hygiene (7-8 hrs of sleep) 2. Learning a new skill (Painting, Carpentry, Pottery, new language, Knitting). 3.Cognitive exercises (keep a daily journal, Puzzles) 4. Physical exercise and training  (30 min/day X 4 days week) 5. Being on Antidepressant if needed 6.Yoga, Meditation, Tai Chi 7. Decrease alcohol intake 8.Have a clear schedule and structure in daily routine  MIND Diet: The MeWalthilliet Intervention for Neurodegenerative Delay, or MIND diet, targets the health of the aging brain. Research participants with the highest MIND diet scores had a significantly slower rate of cognitive decline compared with those with the lowest scores. The effects of the MIND diet on cognition showed greater effects than either the Mediterranean or the DASH diet alone.  The healthy items the MIND diet guidelines suggest include:  3+ servings a day of whole grains 1+ servings a day of vegetables (other than green leafy) 6+ servings a week of green leafy vegetables 5+ servings a week of nuts 4+ meals a week of beans 2+ servings a week of berries 2+ meals a week of  poultry 1+ meals a week of fish Mainly olive oil if added fat is used  The unhealthy items, which are higher in saturated and trans fat, include: Less than 5 servings a week of pastries and sweets Less than 4 servings a week of red meat (including beef, pork, lamb, and products made from these meats) Less than one serving a week of cheese and fried foods Less than 1 tablespoon a day of butter/stick margarine

## 2021-11-28 NOTE — Progress Notes (Signed)
[facility]  PATIENT: Alan Meyers DOB: 03/31/1935  REFERRING CLINICIAN: Donetta Potts, MD HISTORY FROM: self, daughter REASON FOR VISIT: memory loss   HISTORICAL  CHIEF COMPLAINT:  Chief Complaint  Patient presents with   New Patient (Initial Visit)    Pt reports feeling good. He states his been noticing his memory has been worsening. Room 1 with daughter    HISTORY OF PRESENT ILLNESS:  The patient presents for evaluation of memory loss which has been present over the past 2 years. It has been progressively worsening over time. He gets confused easily. Repeats himself in conversations. Will pull things out of drawers and not remember doing it. He has also had some confusion with bank statements and has misplaced his checkbook. He is currently driving short distances. Has not gotten lost.  [facility] 10/27/21 showed diffuse cerebral volume loss and chronic microvascular ischemic changes. It also showed a remote right basal ganglia infarct.  TBI:  No past history of TBI Stroke: remote right basal ganglia infarct Seizures:  no past history of seizures Sleep: No history of sleep apnea. Sleeps well at night Mood:  Daughter describes him as a nervous person at baseline, no significant mood changes  Functional status:  Patient lives by himself, son and daughter check on him daily Cooking: Uses the microwave to heat up food. Daughter and son will provide food Shopping: does his own shopping Driving: he drives short distances to the grocery store without issues Bills: He has missed paying some bills, sometimes misplaces them Medications: He manages his own medications without issues. Mostly takes vitamins. Sometimes forgets to take them. Ever left the stove on by accident?: no Getting lost going to familiar places?: no Forgetting loved ones names?: no Word finding difficulty? yes  OTHER MEDICAL CONDITIONS: HLD, HTN, peripheral neuropathy, iron deficiency  anemia   REVIEW OF SYSTEMS: Full 14 system review of systems performed and negative with exception of: memory loss  ALLERGIES: No Known Allergies  HOME MEDICATIONS: Outpatient Medications Prior to Visit  Medication Sig Dispense Refill   aspirin 81 MG tablet Take 81 mg by mouth daily. (Patient not taking: Reported on 11/28/2021)     fish oil-omega-3 fatty acids 1000 MG capsule Take 1 g by mouth daily. (Patient not taking: Reported on 11/28/2021)     Lutein 6 MG TABS Take 1 tablet by mouth daily. (Patient not taking: Reported on 11/28/2021)     oxyCODONE-acetaminophen (ROXICET) 5-325 MG per tablet Take 1 tablet by mouth every 6 (six) hours as needed for pain. (Patient not taking: Reported on 11/28/2021) 30 tablet 0   No facility-administered medications prior to visit.    PAST MEDICAL HISTORY: Past Medical History:  Diagnosis Date   AAA (abdominal aortic aneurysm) (HCC)    6.1 - 6.2cm   Anemia    Diarrhea, functional 08/01/2012   Headache(784.0)    Hemorrhoids    Hyperlipidemia    Hypertension    no meds for sev years   Memory loss    Peripheral arterial disease (HCC)    Peripheral neuropathy    Plantar warts 05/29/2012   Pulsatile abdominal mass 08/01/2012    PAST SURGICAL HISTORY: Past Surgical History:  Procedure Laterality Date   ABDOMINAL AORTAGRAM N/A 08/11/2012   Procedure: ABDOMINAL Ronny Flurry;  Surgeon: Chuck Hint, MD;  Location: [facility] CATH LAB;  Service: Cardiovascular;  Laterality: N/A;   ABDOMINAL AORTIC ENDOVASCULAR STENT GRAFT N/A 09/18/2012   Procedure: ABDOMINAL AORTIC ENDOVASCULAR STENT GRAFT;  Surgeon: Cristal Deer  Adele Dan, MD;  Location: [facility] OR;  Service: Vascular;  Laterality: N/A;  Ultrasound guided   CATARACT EXTRACTION W/ INTRAOCULAR LENS  IMPLANT, BILATERAL     Hx; of   ENDARTERECTOMY Left 10/28/2012   Procedure: ENDARTERECTOMY CAROTID- LEFT;  Surgeon: Chuck Hint, MD;  Location: [facility] OR;  Service: Vascular;  Laterality: Left;   EYE  SURGERY Bilateral    cat   HEMORRHOID SURGERY  1970's    FAMILY HISTORY: Family History  Problem Relation Age of Onset   Tuberculosis Mother    Heart disease Father        Valvular heart disease and Congestive heart failure   Heart attack Father    Cancer Sister    COPD Brother    COPD Brother     SOCIAL HISTORY: Social History   Socioeconomic History   Marital status: Single    Spouse name: Not on file   Number of children: 2   Years of education: Not on file   Highest education level: Not on file  Occupational History   Not on file  Tobacco Use   Smoking status: Former    Packs/day: 1.50    Years: 40.00    Total pack years: 60.00    Types: Cigarettes    Quit date: 02/27/2003    Years since quitting: 18.7   Smokeless tobacco: Never  Substance and Sexual Activity   Alcohol use: No   Drug use: No   Sexual activity: Not on file  Other Topics Concern   Not on file  Social History Narrative   Lives alone   Social Determinants of Health   Financial Resource Strain: Not on file  Food Insecurity: Not on file  Transportation Needs: Not on file  Physical Activity: Not on file  Stress: Not on file  Social Connections: Not on file  Intimate Partner Violence: Not on file     PHYSICAL EXAM  GENERAL EXAM/CONSTITUTIONAL: Vitals:  Vitals:   11/28/21 1011  BP: (!) 145/64  Pulse: 68  Weight: 135 lb 6 oz (61.4 kg)   Body mass index is 19.42 kg/m. Wt Readings from Last 3 Encounters:  11/28/21 135 lb 6 oz (61.4 kg)  10/28/12 155 lb (70.3 kg)  10/22/12 155 lb (70.3 kg)   NEUROLOGIC: MENTAL STATUS:     11/28/2021   10:13 AM  Montreal Cognitive Assessment   Visuospatial/ Executive (0/5) 0  Naming (0/3) 3  Attention: Read list of digits (0/2) 0  Attention: Read list of letters (0/1) 0  Attention: Serial 7 subtraction starting at 100 (0/3) 2  Language: Repeat phrase (0/2) 0  Language : Fluency (0/1) 0  Abstraction (0/2) 0  Delayed Recall (0/5) 0   Orientation (0/6) 2  Total 7  Adjusted Score (based on education) 8    CRANIAL NERVE:  2nd, 3rd, 4th, 6th - pupils equal and reactive to light, visual fields full to confrontation, extraocular muscles intact, no nystagmus 5th - facial sensation symmetric 7th - facial strength symmetric 8th - hard of hearing 9th - palate elevates symmetrically, uvula midline 11th - shoulder shrug symmetric 12th - tongue protrusion midline  MOTOR:  normal bulk and tone, no cogwheeling, full strength in the BUE, BLE  SENSORY:  normal and symmetric to light touch all 4 extremities  COORDINATION:  finger-nose-finger, fine finger movements normal, no tremor  REFLEXES:  deep tendon reflexes present and symmetric  GAIT/STATION:  Decreased stride length, mildly stooped posture     DIAGNOSTIC DATA (LABS,  IMAGING, TESTING) - I reviewed patient records, labs, notes, testing and imaging myself where available.  Lab Results  Component Value Date   WBC 7.3 10/29/2012   HGB 12.5 (L) 10/29/2012   HCT 36.5 (L) 10/29/2012   MCV 87.1 10/29/2012   PLT 189 10/29/2012      Component Value Date/Time   NA 145 10/29/2012 0415   K 4.3 10/29/2012 0415   CL 111 10/29/2012 0415   CO2 24 10/29/2012 0415   GLUCOSE 122 (H) 10/29/2012 0415   BUN 10 10/29/2012 0415   CREATININE 0.97 10/29/2012 0415   CREATININE 1.00 08/18/2012 1330   CALCIUM 8.8 10/29/2012 0415   PROT 7.7 10/22/2012 1322   ALBUMIN 3.9 10/22/2012 1322   AST 21 10/22/2012 1322   ALT 12 10/22/2012 1322   ALKPHOS 70 10/22/2012 1322   BILITOT 0.9 10/22/2012 1322   GFRNONAA 79 (L) 10/29/2012 0415   GFRAA >90 10/29/2012 0415   10/04/21: B12 649, RPR nonreactive, A1c 5.5, LDL 83    ASSESSMENT AND PLAN  86 y.o. year old male with a history of HLD, HTN, peripheral neuropathy, iron deficiency anemia, right basal ganglia stroke who presents for evaluation of memory loss. MOCA today is 8/30, concerning for dementia. Will order brain MRI to  assess for signs of neurodegeneration and/or significant vascular disease. Will start donepezil for his memory loss. Discussed finding of remote stroke on CTH. Will have him start ASA 81 mg daily for stroke prevention. Recommended he start a low dose statin (LDL 83, goal <70), which he declined at this time. Daughter does not have safety concerns at this time. Discussed that patient would likely benefit from more frequent supervision, but at minimum recommend continuing daily check ups from family members. Advised him to avoid driving as much as possible.  1. Severe dementia without behavioral disturbance, psychotic disturbance, mood disturbance, or anxiety, unspecified dementia type ([place])   2. Memory loss       PLAN: - TSH - MRI brain  - Start ASA 81 mg daily for stroke prevention - Start donepezil 5 mg daily x4 weeks, then increase to 10 mg daily - Avoid driving as much as possible. If absolutely necessary, stick to short distances and familiar places. - Follow up after testing is complete.   Orders Placed This Encounter  Procedures   MR BRAIN WO CONTRAST   TSH    Meds ordered this encounter  Medications   donepezil (ARICEPT) 5 MG tablet    Sig: Take 1 tablet (5 mg total) by mouth at bedtime.    Dispense:  30 tablet    Refill:  0   donepezil (ARICEPT) 10 MG tablet    Sig: Take 1 tablet (10 mg total) by mouth at bedtime.    Dispense:  30 tablet    Refill:  6    Return in about 6 months (around 05/30/2022).  I spent an average of 38 minutes chart reviewing and counseling the patient, with at least 50% of the time face to face with the patient. General brain health measures discussed, including the importance of regular aerobic exercise. Reviewed safety measures including driving safety.   Genia Harold, MD 11/28/21 11:04 AM  [facility] [address] [place], [place] [address] [phone]

## 2021-11-29 ENCOUNTER — Telehealth: Payer: Self-pay

## 2021-11-29 LAB — TSH: TSH: 0.869 u[IU]/mL (ref 0.450–4.500)

## 2021-11-29 NOTE — Telephone Encounter (Signed)
-----   Message from Genia Harold, MD sent at 11/29/2021  8:19 AM EDT ----- Thyroid levels are normal

## 2021-11-29 NOTE — Telephone Encounter (Signed)
Called pt LVM discussing his thyroid levels.

## 2021-12-13 ENCOUNTER — Ambulatory Visit
Admission: RE | Admit: 2021-12-13 | Discharge: 2021-12-13 | Disposition: A | Discharge status: Home / Self Care | Payer: Medicare HMO | Source: Ambulatory Visit | Attending: Psychiatry | Admitting: Psychiatry

## 2021-12-13 DIAGNOSIS — R413 Other amnesia: Secondary | ICD-10-CM

## 2021-12-14 ENCOUNTER — Telehealth: Payer: Self-pay | Admitting: *Deleted

## 2021-12-14 NOTE — Telephone Encounter (Signed)
Spoke with daughter on DPR, MRI shows atrophy (shrinkage of the brain) which is most prominent in the temporal lobe (area responsible for memory). This is most consistent with Alzheimer's disease. Dr Billey Gosling recommends he continue taking donepezil for his memory.  She verbalized understanding, appreciation.

## 2021-12-18 DIAGNOSIS — N189 Chronic kidney disease, unspecified: Secondary | ICD-10-CM | POA: Diagnosis not present

## 2021-12-18 DIAGNOSIS — I714 Abdominal aortic aneurysm, without rupture, unspecified: Secondary | ICD-10-CM | POA: Diagnosis not present

## 2021-12-18 DIAGNOSIS — E7801 Familial hypercholesterolemia: Secondary | ICD-10-CM | POA: Diagnosis not present

## 2021-12-18 DIAGNOSIS — Z6820 Body mass index (BMI) 20.0-20.9, adult: Secondary | ICD-10-CM | POA: Diagnosis not present

## 2021-12-18 DIAGNOSIS — I10 Essential (primary) hypertension: Secondary | ICD-10-CM | POA: Diagnosis not present

## 2021-12-18 DIAGNOSIS — Z1331 Encounter for screening for depression: Secondary | ICD-10-CM | POA: Diagnosis not present

## 2021-12-18 DIAGNOSIS — Z23 Encounter for immunization: Secondary | ICD-10-CM | POA: Diagnosis not present

## 2021-12-18 DIAGNOSIS — Z1389 Encounter for screening for other disorder: Secondary | ICD-10-CM | POA: Diagnosis not present

## 2021-12-18 DIAGNOSIS — R413 Other amnesia: Secondary | ICD-10-CM | POA: Diagnosis not present

## 2021-12-18 DIAGNOSIS — I7090 Unspecified atherosclerosis: Secondary | ICD-10-CM | POA: Diagnosis not present

## 2022-01-26 DIAGNOSIS — Z87891 Personal history of nicotine dependence: Secondary | ICD-10-CM | POA: Diagnosis not present

## 2022-01-26 DIAGNOSIS — I700 Atherosclerosis of aorta: Secondary | ICD-10-CM | POA: Diagnosis not present

## 2022-01-26 DIAGNOSIS — R32 Unspecified urinary incontinence: Secondary | ICD-10-CM | POA: Diagnosis not present

## 2022-01-26 DIAGNOSIS — R69 Illness, unspecified: Secondary | ICD-10-CM | POA: Diagnosis not present

## 2022-01-26 DIAGNOSIS — Z681 Body mass index (BMI) 19 or less, adult: Secondary | ICD-10-CM | POA: Diagnosis not present

## 2022-01-26 DIAGNOSIS — I951 Orthostatic hypotension: Secondary | ICD-10-CM | POA: Diagnosis not present

## 2022-01-26 DIAGNOSIS — N529 Male erectile dysfunction, unspecified: Secondary | ICD-10-CM | POA: Diagnosis not present

## 2022-01-26 DIAGNOSIS — K5900 Constipation, unspecified: Secondary | ICD-10-CM | POA: Diagnosis not present

## 2022-01-26 DIAGNOSIS — H9193 Unspecified hearing loss, bilateral: Secondary | ICD-10-CM | POA: Diagnosis not present

## 2022-01-26 DIAGNOSIS — R636 Underweight: Secondary | ICD-10-CM | POA: Diagnosis not present

## 2022-01-26 DIAGNOSIS — I10 Essential (primary) hypertension: Secondary | ICD-10-CM | POA: Diagnosis not present

## 2022-02-21 DIAGNOSIS — T698XXA Other specified effects of reduced temperature, initial encounter: Secondary | ICD-10-CM | POA: Diagnosis not present

## 2022-02-21 DIAGNOSIS — R69 Illness, unspecified: Secondary | ICD-10-CM | POA: Diagnosis not present

## 2022-02-21 DIAGNOSIS — T699XXA Effect of reduced temperature, unspecified, initial encounter: Secondary | ICD-10-CM | POA: Diagnosis not present

## 2022-02-21 DIAGNOSIS — X31XXXA Exposure to excessive natural cold, initial encounter: Secondary | ICD-10-CM | POA: Diagnosis not present

## 2022-02-21 DIAGNOSIS — F0390 Unspecified dementia without behavioral disturbance: Secondary | ICD-10-CM | POA: Diagnosis not present

## 2022-03-28 DIAGNOSIS — Z6820 Body mass index (BMI) 20.0-20.9, adult: Secondary | ICD-10-CM | POA: Diagnosis not present

## 2022-04-11 DIAGNOSIS — Z681 Body mass index (BMI) 19 or less, adult: Secondary | ICD-10-CM | POA: Diagnosis not present

## 2022-04-11 DIAGNOSIS — J40 Bronchitis, not specified as acute or chronic: Secondary | ICD-10-CM | POA: Diagnosis not present

## 2022-04-11 DIAGNOSIS — R030 Elevated blood-pressure reading, without diagnosis of hypertension: Secondary | ICD-10-CM | POA: Diagnosis not present

## 2022-04-11 DIAGNOSIS — J329 Chronic sinusitis, unspecified: Secondary | ICD-10-CM | POA: Diagnosis not present

## 2022-05-29 DIAGNOSIS — Z681 Body mass index (BMI) 19 or less, adult: Secondary | ICD-10-CM | POA: Diagnosis not present

## 2022-05-29 DIAGNOSIS — K921 Melena: Secondary | ICD-10-CM | POA: Diagnosis not present

## 2022-05-29 DIAGNOSIS — R197 Diarrhea, unspecified: Secondary | ICD-10-CM | POA: Diagnosis not present

## 2022-06-14 NOTE — Progress Notes (Deleted)
   CC:  dementia  Follow-up Visit  Last visit: 11/28/21  Brief HPI: 87 year old male with a history of CVA, HLD, HTN, peripheral neuropathy, iron deficiency anemia who follows in clinic for memory loss.  At his last visit, brain MRI was ordered and he was started on donepezil. He was started on ASA for stroke prevention. Declined statin.  Interval History: Brain MRI 12/13/21 showed perisylvian and mesial temporal lobe atrophy. It also showed a chronic right frontal periventricular infarct.  Memory***donepezil***  ASA***   Physical Exam:   Vital Signs: There were no vitals taken for this visit. GENERAL:  well appearing, in no acute distress, alert  SKIN:  Color, texture, turgor normal. No rashes or lesions HEAD:  Normocephalic/atraumatic. RESP: normal respiratory effort MSK:  No gross joint deformities.   NEUROLOGICAL: Mental Status: Alert, oriented to person, place and time, Follows commands, and Speech fluent and appropriate. Cranial Nerves: PERRL, face symmetric, no dysarthria, hearing grossly intact Motor: moves all extremities equally Gait: normal-based.  IMPRESSION: ***  PLAN: ***   Follow-up: ***  I spent a total of *** minutes on the date of the service. Discussed medication side effects, adverse reactions and drug interactions. Written educational materials and patient instructions outlining all of the above were given.  Ocie Doyne, MD

## 2022-06-16 DIAGNOSIS — R918 Other nonspecific abnormal finding of lung field: Secondary | ICD-10-CM | POA: Diagnosis not present

## 2022-06-16 DIAGNOSIS — Z87891 Personal history of nicotine dependence: Secondary | ICD-10-CM | POA: Diagnosis not present

## 2022-06-16 DIAGNOSIS — R600 Localized edema: Secondary | ICD-10-CM | POA: Diagnosis not present

## 2022-06-18 ENCOUNTER — Ambulatory Visit: Payer: Medicare Other | Admitting: Psychiatry

## 2022-06-18 ENCOUNTER — Emergency Department (HOSPITAL_COMMUNITY)
Admission: EM | Admit: 2022-06-18 | Discharge: 2022-06-18 | Discharge status: Against Medical Advice | Payer: Medicare Other | Attending: Emergency Medicine | Admitting: Emergency Medicine

## 2022-06-18 ENCOUNTER — Telehealth: Payer: Self-pay | Admitting: Psychiatry

## 2022-06-18 ENCOUNTER — Other Ambulatory Visit: Payer: Self-pay

## 2022-06-18 ENCOUNTER — Encounter (HOSPITAL_COMMUNITY): Payer: Self-pay

## 2022-06-18 DIAGNOSIS — Z5321 Procedure and treatment not carried out due to patient leaving prior to being seen by health care provider: Secondary | ICD-10-CM | POA: Insufficient documentation

## 2022-06-18 DIAGNOSIS — Z681 Body mass index (BMI) 19 or less, adult: Secondary | ICD-10-CM | POA: Diagnosis not present

## 2022-06-18 DIAGNOSIS — M7989 Other specified soft tissue disorders: Secondary | ICD-10-CM | POA: Diagnosis not present

## 2022-06-18 DIAGNOSIS — L0390 Cellulitis, unspecified: Secondary | ICD-10-CM | POA: Diagnosis not present

## 2022-06-18 DIAGNOSIS — K1370 Unspecified lesions of oral mucosa: Secondary | ICD-10-CM | POA: Diagnosis not present

## 2022-06-18 NOTE — ED Triage Notes (Signed)
Pt reports he was sent by PCP for right lower leg swelling to check for a DVT.

## 2022-06-18 NOTE — Telephone Encounter (Signed)
LVM and sent text msg informing pt of need to reschedule 06/18/22 appointment - MD out

## 2022-06-19 ENCOUNTER — Observation Stay (HOSPITAL_COMMUNITY)
Admission: EM | Admit: 2022-06-19 | Discharge: 2022-06-20 | Disposition: A | Discharge status: Home / Self Care | Payer: Medicare Other | Attending: Internal Medicine | Admitting: Internal Medicine

## 2022-06-19 ENCOUNTER — Observation Stay (HOSPITAL_COMMUNITY): Payer: Medicare Other

## 2022-06-19 ENCOUNTER — Emergency Department (HOSPITAL_COMMUNITY): Payer: Medicare Other

## 2022-06-19 ENCOUNTER — Other Ambulatory Visit: Payer: Self-pay

## 2022-06-19 ENCOUNTER — Encounter (HOSPITAL_COMMUNITY): Payer: Self-pay | Admitting: Emergency Medicine

## 2022-06-19 DIAGNOSIS — M79661 Pain in right lower leg: Secondary | ICD-10-CM | POA: Diagnosis not present

## 2022-06-19 DIAGNOSIS — I714 Abdominal aortic aneurysm, without rupture, unspecified: Secondary | ICD-10-CM | POA: Diagnosis present

## 2022-06-19 DIAGNOSIS — E876 Hypokalemia: Secondary | ICD-10-CM | POA: Diagnosis not present

## 2022-06-19 DIAGNOSIS — L03115 Cellulitis of right lower limb: Secondary | ICD-10-CM | POA: Diagnosis not present

## 2022-06-19 DIAGNOSIS — N179 Acute kidney failure, unspecified: Secondary | ICD-10-CM | POA: Diagnosis not present

## 2022-06-19 DIAGNOSIS — Z8679 Personal history of other diseases of the circulatory system: Secondary | ICD-10-CM | POA: Diagnosis not present

## 2022-06-19 DIAGNOSIS — F0390 Unspecified dementia without behavioral disturbance: Secondary | ICD-10-CM | POA: Insufficient documentation

## 2022-06-19 DIAGNOSIS — R2681 Unsteadiness on feet: Secondary | ICD-10-CM | POA: Insufficient documentation

## 2022-06-19 DIAGNOSIS — R600 Localized edema: Secondary | ICD-10-CM | POA: Insufficient documentation

## 2022-06-19 DIAGNOSIS — D500 Iron deficiency anemia secondary to blood loss (chronic): Secondary | ICD-10-CM | POA: Diagnosis present

## 2022-06-19 DIAGNOSIS — M7989 Other specified soft tissue disorders: Secondary | ICD-10-CM | POA: Diagnosis not present

## 2022-06-19 DIAGNOSIS — I129 Hypertensive chronic kidney disease with stage 1 through stage 4 chronic kidney disease, or unspecified chronic kidney disease: Secondary | ICD-10-CM | POA: Insufficient documentation

## 2022-06-19 DIAGNOSIS — Z79899 Other long term (current) drug therapy: Secondary | ICD-10-CM | POA: Diagnosis not present

## 2022-06-19 DIAGNOSIS — Z87891 Personal history of nicotine dependence: Secondary | ICD-10-CM | POA: Insufficient documentation

## 2022-06-19 DIAGNOSIS — R413 Other amnesia: Secondary | ICD-10-CM

## 2022-06-19 DIAGNOSIS — R339 Retention of urine, unspecified: Secondary | ICD-10-CM | POA: Diagnosis not present

## 2022-06-19 DIAGNOSIS — I10 Essential (primary) hypertension: Secondary | ICD-10-CM | POA: Diagnosis not present

## 2022-06-19 DIAGNOSIS — Z8673 Personal history of transient ischemic attack (TIA), and cerebral infarction without residual deficits: Secondary | ICD-10-CM

## 2022-06-19 DIAGNOSIS — N189 Chronic kidney disease, unspecified: Secondary | ICD-10-CM | POA: Insufficient documentation

## 2022-06-19 DIAGNOSIS — Z7982 Long term (current) use of aspirin: Secondary | ICD-10-CM | POA: Diagnosis not present

## 2022-06-19 DIAGNOSIS — M6281 Muscle weakness (generalized): Secondary | ICD-10-CM | POA: Diagnosis not present

## 2022-06-19 DIAGNOSIS — E785 Hyperlipidemia, unspecified: Secondary | ICD-10-CM

## 2022-06-19 DIAGNOSIS — R2689 Other abnormalities of gait and mobility: Secondary | ICD-10-CM | POA: Insufficient documentation

## 2022-06-19 DIAGNOSIS — I739 Peripheral vascular disease, unspecified: Secondary | ICD-10-CM

## 2022-06-19 LAB — LACTIC ACID, PLASMA: Lactic Acid, Venous: 0.7 mmol/L (ref 0.5–1.9)

## 2022-06-19 LAB — URINALYSIS, ROUTINE W REFLEX MICROSCOPIC
Bacteria, UA: NONE SEEN
Bilirubin Urine: NEGATIVE
Glucose, UA: NEGATIVE mg/dL
Hgb urine dipstick: NEGATIVE
Ketones, ur: 5 mg/dL — AB
Leukocytes,Ua: NEGATIVE
Nitrite: NEGATIVE
Protein, ur: 30 mg/dL — AB
Specific Gravity, Urine: 1.019 (ref 1.005–1.030)
pH: 5 (ref 5.0–8.0)

## 2022-06-19 LAB — CBC WITH DIFFERENTIAL/PLATELET
Abs Immature Granulocytes: 0.04 10*3/uL (ref 0.00–0.07)
Basophils Absolute: 0.1 10*3/uL (ref 0.0–0.1)
Basophils Relative: 1 %
Eosinophils Absolute: 0 10*3/uL (ref 0.0–0.5)
Eosinophils Relative: 0 %
HCT: 36.1 % — ABNORMAL LOW (ref 39.0–52.0)
Hemoglobin: 11.9 g/dL — ABNORMAL LOW (ref 13.0–17.0)
Immature Granulocytes: 0 %
Lymphocytes Relative: 9 %
Lymphs Abs: 0.9 10*3/uL (ref 0.7–4.0)
MCH: 30.8 pg (ref 26.0–34.0)
MCHC: 33 g/dL (ref 30.0–36.0)
MCV: 93.5 fL (ref 80.0–100.0)
Monocytes Absolute: 1 10*3/uL (ref 0.1–1.0)
Monocytes Relative: 10 %
Neutro Abs: 7.6 10*3/uL (ref 1.7–7.7)
Neutrophils Relative %: 80 %
Platelets: 318 10*3/uL (ref 150–400)
RBC: 3.86 MIL/uL — ABNORMAL LOW (ref 4.22–5.81)
RDW: 13.8 % (ref 11.5–15.5)
WBC: 9.6 10*3/uL (ref 4.0–10.5)
nRBC: 0 % (ref 0.0–0.2)

## 2022-06-19 LAB — FOLATE: Folate: 31.2 ng/mL (ref >5.9)

## 2022-06-19 LAB — COMPREHENSIVE METABOLIC PANEL
ALT: 22 U/L (ref 0–44)
AST: 25 U/L (ref 15–41)
Albumin: 3.2 g/dL — ABNORMAL LOW (ref 3.5–5.0)
Alkaline Phosphatase: 76 U/L (ref 38–126)
Anion gap: 12 (ref 5–15)
BUN: 32 mg/dL — ABNORMAL HIGH (ref 8–23)
CO2: 24 mmol/L (ref 22–32)
Calcium: 9 mg/dL (ref 8.9–10.3)
Chloride: 99 mmol/L (ref 98–111)
Creatinine, Ser: 1.3 mg/dL — ABNORMAL HIGH (ref 0.61–1.24)
GFR, Estimated: 54 mL/min — ABNORMAL LOW (ref >60)
Glucose, Bld: 98 mg/dL (ref 70–99)
Potassium: 3.7 mmol/L (ref 3.5–5.1)
Sodium: 135 mmol/L (ref 135–145)
Total Bilirubin: 1.1 mg/dL (ref 0.3–1.2)
Total Protein: 7.7 g/dL (ref 6.5–8.1)

## 2022-06-19 LAB — BRAIN NATRIURETIC PEPTIDE: B Natriuretic Peptide: 487 pg/mL — ABNORMAL HIGH (ref 0.0–100.0)

## 2022-06-19 MED ORDER — LACTATED RINGERS IV BOLUS
500.0000 mL | Freq: Once | INTRAVENOUS | Status: AC
  Administered 2022-06-19: 500 mL via INTRAVENOUS

## 2022-06-19 MED ORDER — ENOXAPARIN SODIUM 40 MG/0.4ML IJ SOSY
40.0000 mg | PREFILLED_SYRINGE | INTRAMUSCULAR | Status: DC
  Administered 2022-06-19: 40 mg via SUBCUTANEOUS
  Filled 2022-06-19: qty 0.4

## 2022-06-19 MED ORDER — ACETAMINOPHEN 650 MG RE SUPP: 650.0000 mg | Freq: Four times a day (QID) | RECTAL | Status: DC | PRN

## 2022-06-19 MED ORDER — CEFAZOLIN SODIUM-DEXTROSE 1-4 GM/50ML-% IV SOLN
1.0000 g | Freq: Three times a day (TID) | INTRAVENOUS | Status: DC
  Administered 2022-06-19 – 2022-06-20 (×2): 1 g via INTRAVENOUS
  Filled 2022-06-19 (×4): qty 50

## 2022-06-19 MED ORDER — ACETAMINOPHEN 325 MG PO TABS: 650.0000 mg | ORAL_TABLET | Freq: Four times a day (QID) | ORAL | Status: DC | PRN

## 2022-06-19 MED ORDER — SODIUM CHLORIDE 0.9% FLUSH
3.0000 mL | Freq: Two times a day (BID) | INTRAVENOUS | Status: DC
  Administered 2022-06-19 – 2022-06-20 (×2): 3 mL via INTRAVENOUS

## 2022-06-19 MED ORDER — SODIUM CHLORIDE 0.9% FLUSH: 3.0000 mL | INTRAVENOUS | Status: DC | PRN

## 2022-06-19 MED ORDER — SODIUM CHLORIDE 0.9 % IV SOLN: 250.0000 mL | INTRAVENOUS | Status: DC | PRN

## 2022-06-19 MED ORDER — CEFAZOLIN SODIUM-DEXTROSE 2-4 GM/100ML-% IV SOLN
2.0000 g | Freq: Once | INTRAVENOUS | Status: AC
  Administered 2022-06-19: 2 g via INTRAVENOUS
  Filled 2022-06-19: qty 100

## 2022-06-19 MED ORDER — LACTATED RINGERS IV SOLN: INTRAVENOUS | Status: AC

## 2022-06-19 MED ORDER — METOPROLOL TARTRATE 5 MG/5ML IV SOLN: 5.0000 mg | Freq: Three times a day (TID) | INTRAVENOUS | Status: DC | PRN

## 2022-06-19 NOTE — Assessment & Plan Note (Signed)
Followed by neurology for memory loss CVA found on imaging. Started on ASA, declined statin therapy

## 2022-06-19 NOTE — ED Provider Notes (Signed)
[place] EMERGENCY DEPARTMENT AT [facility] Provider Note   CSN: [id number] Arrival date & time: 06/19/22  4098     History  Chief Complaint  Patient presents with   Leg Swelling    Alan Meyers is a 87 y.o. male.  HPI      Alan Meyers is a 87 y.o. male with past medical history significant for anemia, hypertension, peripheral artery disease, and history of AAA who presents to the Emergency Department complaining of pain swelling and redness of the right lower extremity.  Symptoms present x 3 days.  Was seen at [facility] ER 3 days ago and had follow-up visit with his PCP who advised him to come here for further evaluation of possible blood clot of the extremity.  He was prescribed Augmentin 875 mg, he has taken 2 doses so far without improvement of his symptoms.  He describes increased swelling throughout his lower leg with pain associated with weightbearing and with dorsiflexion and plantarflexion of his foot.  He has open wound to the plantar surface of his foot he is unsure how this occurred.  He denies any fever, chills, nausea or vomiting.   Home Medications Prior to Admission medications   Medication Sig Start Date End Date Taking? Authorizing Provider  aspirin 81 MG tablet Take 81 mg by mouth daily. Patient not taking: Reported on 11/28/2021    [provider]  donepezil (ARICEPT) 10 MG tablet Take 1 tablet (10 mg total) by mouth at bedtime. 12/29/21   Ocie Doyne, MD  donepezil (ARICEPT) 5 MG tablet Take 1 tablet (5 mg total) by mouth at bedtime. 11/28/21   Ocie Doyne, MD  fish oil-omega-3 fatty acids 1000 MG capsule Take 1 g by mouth daily. Patient not taking: Reported on 11/28/2021    [provider]  Lutein 6 MG TABS Take 1 tablet by mouth daily. Patient not taking: Reported on 11/28/2021    [provider]  oxyCODONE-acetaminophen (ROXICET) 5-325 MG per tablet Take 1 tablet by mouth every 6 (six) hours as needed for pain. Patient  not taking: Reported on 11/28/2021 10/29/12   Lars Mage, PA-C      Allergies    Patient has no known allergies.    Review of Systems   Review of Systems  Constitutional:  Negative for chills and fever.  Respiratory:  Negative for cough and shortness of breath.   Cardiovascular:  Positive for leg swelling (Swelling right lower leg). Negative for chest pain.  Gastrointestinal:  Negative for abdominal pain, nausea and vomiting.  Musculoskeletal:  Negative for arthralgias (Right foot pain).  Skin:  Positive for color change and wound.  Neurological:  Negative for dizziness and weakness.  Psychiatric/Behavioral:  Negative for confusion.     Physical Exam Updated Vital Signs BP (!) 156/70 (BP Location: Right Arm)   Pulse 84   Temp 98.5 F (36.9 C) (Oral)   Resp 19   SpO2 95%  Physical Exam Vitals and nursing note reviewed.  Constitutional:      General: He is not in acute distress.    Appearance: Normal appearance. He is not toxic-appearing.  HENT:     Mouth/Throat:     Mouth: Mucous membranes are moist.  Eyes:     Pupils: Pupils are equal, round, and reactive to light.  Cardiovascular:     Rate and Rhythm: Normal rate and regular rhythm.     Pulses: Normal pulses.     Comments: Dorsalis pedis and posterior tibial pulses  heard with portable Doppler Pulmonary:     Effort: Pulmonary effort is normal. No respiratory distress.  Abdominal:     Palpations: Abdomen is soft.     Tenderness: There is no abdominal tenderness.  Musculoskeletal:        General: Tenderness present. No swelling. Normal range of motion.     Right lower leg: Edema present.     Left lower leg: No edema.     Comments: Erythema and edema to the right foot extending to stool right lower leg.  No lymphangitis.  Posterior calf nontender.    See attached photos  Skin:    General: Skin is warm.     Capillary Refill: Capillary refill takes 2 to 3 seconds.     Findings: Erythema present.     Comments:  Chronic appearing wound to plantar surface of the right foot.  No purulent drainage noted  See attached photos  Neurological:     General: No focal deficit present.     Mental Status: He is alert.     Sensory: No sensory deficit.     Motor: No weakness.        ED Results / Procedures / Treatments   Labs (all labs ordered are listed, but only abnormal results are displayed) Labs Reviewed  CBC WITH DIFFERENTIAL/PLATELET - Abnormal; Notable for the following components:      Result Value   RBC 3.86 (*)    Hemoglobin 11.9 (*)    HCT 36.1 (*)    All other components within normal limits  COMPREHENSIVE METABOLIC PANEL - Abnormal; Notable for the following components:   BUN 32 (*)    Creatinine, Ser 1.30 (*)    Albumin 3.2 (*)    GFR, Estimated 54 (*)    All other components within normal limits  LACTIC ACID, PLASMA    EKG None  Radiology DG Foot Complete Right  Result Date: 06/19/2022 CLINICAL DATA:  Plantar wound.  Swelling EXAM: RIGHT FOOT COMPLETE - 3 VIEW COMPARISON:  None Available. FINDINGS: Small well corticated plantar calcaneal spur. No fracture or dislocation. Preserved bone mineralization. No underlying bony erosive changes at this time. Please correlate for the exact location of the wound. If there is further concern of bone or soft tissue infection, cross-sectional imaging can be considered as clinically appropriate IMPRESSION: Calcaneal spur.  No acute osseous abnormality Electronically Signed   By: Karen Kays M.D.   On: 06/19/2022 13:46   US Venous Img Lower Unilateral Right  Result Date: 06/19/2022 CLINICAL DATA:  Right lower extremity pain and edema. Former smoker. Evaluate for DVT. EXAM: RIGHT LOWER EXTREMITY VENOUS DOPPLER ULTRASOUND TECHNIQUE: Gray-scale sonography with graded compression, as well as color Doppler and duplex ultrasound were performed to evaluate the lower extremity deep venous systems from the level of the common femoral vein and including  the common femoral, femoral, profunda femoral, popliteal and calf veins including the posterior tibial, peroneal and gastrocnemius veins when visible. The superficial great saphenous vein was also interrogated. Spectral Doppler was utilized to evaluate flow at rest and with distal augmentation maneuvers in the common femoral, femoral and popliteal veins. COMPARISON:  None Available. FINDINGS: Contralateral Common Femoral Vein: Respiratory phasicity is normal and symmetric with the symptomatic side. No evidence of thrombus. Normal compressibility. Common Femoral Vein: No evidence of thrombus. Normal compressibility, respiratory phasicity and response to augmentation. Saphenofemoral Junction: No evidence of thrombus. Normal compressibility and flow on color Doppler imaging. Profunda Femoral Vein: No evidence of thrombus. Normal  compressibility and flow on color Doppler imaging. Femoral Vein: No evidence of thrombus. Normal compressibility, respiratory phasicity and response to augmentation. Popliteal Vein: No evidence of thrombus. Normal compressibility, respiratory phasicity and response to augmentation. Calf Veins: No evidence of thrombus. Normal compressibility and flow on color Doppler imaging. Superficial Great Saphenous Vein: No evidence of thrombus. Normal compressibility. Other Findings: There is a minimal amount of eccentric echogenic plaque seen within the both the right and left common femoral arteries. Scattered echogenic plaque is seen throughout the right superficial femoral and popliteal arteries. Note is made of a non pathologically enlarged right inguinal lymph node which measures 0.7 cm in greatest short axis diameter, presumably reactive in etiology. IMPRESSION: No evidence of DVT within the right lower extremity. Electronically Signed   By: Simonne Come M.D.   On: 06/19/2022 10:27    Procedures Procedures    Medications Ordered in ED Medications  ceFAZolin (ANCEF) IVPB 2g/100 mL premix (0 g  Intravenous Stopped 06/19/22 1416)    ED Course/ Medical Decision Making/ A&P                             Medical Decision Making Patient here from home for evaluation of pain swelling and redness of his right foot that it is extending to his lower extremity.  Was started on antibiotics from his PCP, sent here for further evaluation and imaging to rule out blood clot.  He has a open wound to the plantar surface of his foot that he is unsure how this occurred.  Differential would include but not limited to cellulitis, DVT, peripheral vascular disease, limb ischemia, osteomyelitis  Amount and/or Complexity of Data Reviewed Labs: ordered.    Details: Labs interpreted by me, no evidence of leukocytosis, chemistries show slightly elevated serum creatinine 1.30, otherwise unremarkable.  Lactic acid also unremarkable.  Kidney function from [facility] ER 2 days ago show creatinine of 1.26 Radiology: ordered.    Details: X-ray of the right foot without evidence of osteomyelitis, venous ultrasound negative for presence of DVT Discussion of management or test interpretation with external provider(s): Patient given IV Ancef here, no improvement with outpatient antibiotics, no purulent drainage but patient admits to only 2 doses so far.  Having difficulty with weightbearing secondary to pain of his foot.  Would likely benefit from hospital admit for cellulitis  Discussed with [facility] hospitalist, Dr. Sheppard Penton who is agreeable to admit  Risk Prescription drug management.           Final Clinical Impression(s) / ED Diagnoses Final diagnoses:  Cellulitis of right lower extremity    Rx / DC Orders ED Discharge Orders     None         Pauline Aus, PA-C 06/19/22 1616    Gloris Manchester, MD 06/19/22 1711

## 2022-06-19 NOTE — Assessment & Plan Note (Addendum)
87 year old presenting with week +history of worsening right lower leg swelling, redness and pain found to have cellulitis of right lower leg with possible failed outpatient antibiotics. Apparently has had issues for years, history is difficult.  -obs to telemetry  -concern for vascular disease, history of PAD, thready to non palpable pedal pulses with color changes and chronic history of leg issues that wax and wane  -check lipid panel, ABI. No critical ischemic concerns, restart his ASA  -continue ancef and transition to oral antibiotics  -no leukocytosis, fever or signs of systemic infection. Has had some tachycardia/pain  -DVT ruled out, negative doppler of RLE  -likely has some degree of venous stasis-TED hose

## 2022-06-19 NOTE — Assessment & Plan Note (Addendum)
Stable, appears to be on no medication Will monitor and add PRN IV medication

## 2022-06-19 NOTE — Assessment & Plan Note (Addendum)
ABI pending, declines statin. restart aSA  Lipid panel in AM

## 2022-06-19 NOTE — ED Notes (Addendum)
Pt given urinal for urine sample, pt states he does know when he needs to urinate but states he does not have to go at this moment

## 2022-06-19 NOTE — ED Notes (Signed)
Pt urinated 200 mL of urine in urinal. Specimen sent to lab

## 2022-06-19 NOTE — H&P (Signed)
History and Physical    Patient: Alan Meyers ZOX:[id number] DOB: [birth year] DOA: 06/19/2022 DOS: the patient was seen and examined on 06/19/2022 PCP: Donetta Potts, MD  Patient coming from: Home - lives with his ex wife. Ambulates independently    Chief Complaint: leg swelling   HPI: Alan Meyers is a 87 y.o. male with medical history significant of HTN, HLD, PAD, peripheral neuropathy, AAA, CKD, hx of right basal ganglia CVA presenting for right leg swelling.  He went to [facility] ED on 4/20 with complaints of bilateral LE edema. Treated with lasix and advised to follow up with PCP. Saw PCP yesterday who started him on augmentin. He was told by his PCP to go to the ER yesterday to rule out DVT. He came today. He is a very poor historian. He tells me he went to urgent care because it was swollen and hurting. It continued to be swollen so that prompted him to go to his PCP. He denies any fever/chills. He has been eating and drinking okay. He is able to ambulate on his foot, but states sometimes it's painful. He states his feet do get cold and can change colors to a light brown.    Denies any fever/chills, vision changes/headaches, chest pain or palpitations, shortness of breath or cough, abdominal pain, N/V/D, dysuria or leg swelling.    He does not smoke or drink alcohol.   ER Course:  vitals: afebrile, bp: 156/70, HR: 84, RR: 19, oxygen: 95% Pertinent labs: hgb: 11.9, BUN: 32, creatinine: 1.30, lactic acid: wnl  Right LE DVT doppler: negative  Right foot xray: no acute finding In ED: given ancef, TRH asked to admit.     Review of Systems: As mentioned in the history of present illness. All other systems reviewed and are negative. Past Medical History:  Diagnosis Date   AAA (abdominal aortic aneurysm)    6.1 - 6.2cm   Anemia    Diarrhea, functional 08/01/2012   Headache(784.0)    Hemorrhoids    Hyperlipidemia    Hypertension    no meds for sev years   Memory loss    Peripheral  arterial disease    Peripheral neuropathy    Plantar warts 05/29/2012   Pulsatile abdominal mass 08/01/2012   Past Surgical History:  Procedure Laterality Date   ABDOMINAL AORTAGRAM N/A 08/11/2012   Procedure: ABDOMINAL Ronny Flurry;  Surgeon: Chuck Hint, MD;  Location: [facility] CATH LAB;  Service: Cardiovascular;  Laterality: N/A;   ABDOMINAL AORTIC ENDOVASCULAR STENT GRAFT N/A 09/18/2012   Procedure: ABDOMINAL AORTIC ENDOVASCULAR STENT GRAFT;  Surgeon: Chuck Hint, MD;  Location: [facility] OR;  Service: Vascular;  Laterality: N/A;  Ultrasound guided   CATARACT EXTRACTION W/ INTRAOCULAR LENS  IMPLANT, BILATERAL     Hx; of   ENDARTERECTOMY Left 10/28/2012   Procedure: ENDARTERECTOMY CAROTID- LEFT;  Surgeon: Chuck Hint, MD;  Location: [facility] OR;  Service: Vascular;  Laterality: Left;   EYE SURGERY Bilateral    cat   HEMORRHOID SURGERY  1970's   Social History:  reports that he quit smoking about 19 years ago. His smoking use included cigarettes. He has a 60.00 pack-year smoking history. He has never used smokeless tobacco. He reports that he does not drink alcohol and does not use drugs.  No Known Allergies  Family History  Problem Relation Age of Onset   Tuberculosis Mother    Heart disease Father        Valvular heart disease and Congestive heart failure  Heart attack Father    Cancer Sister    COPD Brother    COPD Brother     Prior to Admission medications   Medication Sig Start Date End Date Taking? Authorizing Provider  aspirin 81 MG tablet Take 81 mg by mouth daily. Patient not taking: Reported on 11/28/2021    [provider]  donepezil (ARICEPT) 10 MG tablet Take 1 tablet (10 mg total) by mouth at bedtime. 12/29/21   Ocie Doyne, MD  donepezil (ARICEPT) 5 MG tablet Take 1 tablet (5 mg total) by mouth at bedtime. 11/28/21   Ocie Doyne, MD  fish oil-omega-3 fatty acids 1000 MG capsule Take 1 g by mouth daily. Patient not taking: Reported on  11/28/2021    [provider]  Lutein 6 MG TABS Take 1 tablet by mouth daily. Patient not taking: Reported on 11/28/2021    [provider]  oxyCODONE-acetaminophen (ROXICET) 5-325 MG per tablet Take 1 tablet by mouth every 6 (six) hours as needed for pain. Patient not taking: Reported on 11/28/2021 10/29/12   Lars Mage, [place]    Physical Exam: Vitals:   06/19/22 0941 06/19/22 1300 06/19/22 1630 06/19/22 1735  BP: 124/75 (!) 156/70 (!) 145/73 (!) 153/75  Pulse: 76 84 68 71  Resp: 20 19 19 20   Temp: 98.5 F (36.9 C) 98.5 F (36.9 C) 98.5 F (36.9 C) 97.6 F (36.4 C)  TempSrc: Oral Oral Oral Oral  SpO2: 95% 95% 96% 100%  Weight:    57.4 kg  Height:    5\' 9"  (1.753 m)   General:  Appears calm and comfortable and is in NAD. Thin appearing  Eyes:  PERRL, EOMI, normal lids, iris ENT:  grossly normal hearing, lips & tongue, mmm; appropriate dentition Neck:  no LAD, masses or thyromegaly; no carotid bruits Cardiovascular:  RRR, no m/r/g. +edema RLE  Respiratory:   CTA bilaterally with no wheezes/rales/rhonchi.  Normal respiratory effort. Abdomen:  soft, NT, ND, NABS Back:   normal alignment, no CVAT Skin:  RLE: edematous and erythematous. Pedal pulses non palpable to thready (DP)   Musculoskeletal:  grossly normal tone BUE/BLE, good ROM, no bony abnormality Lower extremity:   Left foot cool with some discoloration. Healed ulcer vs. Corn on his left big toe.  Psychiatric:  grossly normal mood and affect, speech fluent and appropriate, AOx3 Neurologic:  CN 2-12 grossly intact, moves all extremities in coordinated fashion, sensation intact   Radiological Exams on Admission: Independently reviewed - see discussion in A/P where applicable  DG Foot Complete Right  Result Date: 06/19/2022 CLINICAL DATA:  Plantar wound.  Swelling EXAM: RIGHT FOOT COMPLETE - 3 VIEW COMPARISON:  None Available. FINDINGS: Small well corticated plantar calcaneal spur. No fracture or  dislocation. Preserved bone mineralization. No underlying bony erosive changes at this time. Please correlate for the exact location of the wound. If there is further concern of bone or soft tissue infection, cross-sectional imaging can be considered as clinically appropriate IMPRESSION: Calcaneal spur.  No acute osseous abnormality Electronically Signed   By: Karen Kays M.D.   On: 06/19/2022 13:46   US Venous Img Lower Unilateral Right  Result Date: 06/19/2022 CLINICAL DATA:  Right lower extremity pain and edema. Former smoker. Evaluate for DVT. EXAM: RIGHT LOWER EXTREMITY VENOUS DOPPLER ULTRASOUND TECHNIQUE: Gray-scale sonography with graded compression, as well as color Doppler and duplex ultrasound were performed to evaluate the lower extremity deep venous systems from the level of the common femoral vein and including  the common femoral, femoral, profunda femoral, popliteal and calf veins including the posterior tibial, peroneal and gastrocnemius veins when visible. The superficial great saphenous vein was also interrogated. Spectral Doppler was utilized to evaluate flow at rest and with distal augmentation maneuvers in the common femoral, femoral and popliteal veins. COMPARISON:  None Available. FINDINGS: Contralateral Common Femoral Vein: Respiratory phasicity is normal and symmetric with the symptomatic side. No evidence of thrombus. Normal compressibility. Common Femoral Vein: No evidence of thrombus. Normal compressibility, respiratory phasicity and response to augmentation. Saphenofemoral Junction: No evidence of thrombus. Normal compressibility and flow on color Doppler imaging. Profunda Femoral Vein: No evidence of thrombus. Normal compressibility and flow on color Doppler imaging. Femoral Vein: No evidence of thrombus. Normal compressibility, respiratory phasicity and response to augmentation. Popliteal Vein: No evidence of thrombus. Normal compressibility, respiratory phasicity and response to  augmentation. Calf Veins: No evidence of thrombus. Normal compressibility and flow on color Doppler imaging. Superficial Great Saphenous Vein: No evidence of thrombus. Normal compressibility. Other Findings: There is a minimal amount of eccentric echogenic plaque seen within the both the right and left common femoral arteries. Scattered echogenic plaque is seen throughout the right superficial femoral and popliteal arteries. Note is made of a non pathologically enlarged right inguinal lymph node which measures 0.7 cm in greatest short axis diameter, presumably reactive in etiology. IMPRESSION: No evidence of DVT within the right lower extremity. Electronically Signed   By: Simonne Come M.D.   On: 06/19/2022 10:27    EKG: Independently reviewed.  NSR with rate 68; nonspecific ST changes with no evidence of acute ischemia Initial EKG read as afib, but so much artifact, repeat NSR.   Labs on Admission: I have personally reviewed the available labs and imaging studies at the time of the admission.  Pertinent labs:   hgb: 11.9,  BUN: 32, creatinine: 1.30,  lactic acid: wnl   Assessment and Plan: Principal Problem:   Cellulitis of right lower leg Active Problems:   Acute kidney injury superimposed on chronic kidney disease   Iron deficiency anemia due to chronic blood loss   Essential hypertension   History of CVA (cerebrovascular accident)   Hyperlipidemia   Peripheral artery disease   Memory loss   AAA (abdominal aortic aneurysm) without rupture-6.1 cm    Assessment and Plan: * Cellulitis of right lower leg 87 year old presenting with week +history of worsening right lower leg swelling, redness and pain found to have cellulitis of right lower leg with possible failed outpatient antibiotics. Apparently has had issues for years, history is difficult.  -obs to telemetry  -concern for vascular disease, history of PAD, thready to non palpable pedal pulses with color changes and chronic history  of leg issues that wax and wane  -check lipid panel, ABI. No critical ischemic concerns, restart his ASA  -continue ancef and transition to oral antibiotics  -no leukocytosis, fever or signs of systemic infection. Has had some tachycardia/pain  -DVT ruled out, negative doppler of RLE  -likely has some degree of venous stasis-TED hose    Acute kidney injury superimposed on chronic kidney disease Unsure of CKD, appears to be stage 2-3 UA pending LR bolus +gentle, time limited IVF Strict I/O Bladder scan showed 384L in and out cath and monitor output  Check psa, may have BPH.  Trend    Iron deficiency anemia due to chronic blood loss Stable, continue to follow  Iron studies pending   Essential hypertension Stable, appears to be on  no medication Will monitor and add PRN IV medication   History of CVA (cerebrovascular accident) Followed by neurology for memory loss CVA found on imaging. Started on ASA, declined statin therapy   Hyperlipidemia Declines statin   Peripheral artery disease ABI pending, declines statin. restart aSA  Lipid panel in AM   Memory loss Appears to not be taking aricept  Delirium precautions   AAA (abdominal aortic aneurysm) without rupture-6.1 cm Unsure if he is still followed for this. Follow up outpatient surveillance     Advance Care Planning:   Code Status: Full Code   Consults: none   DVT Prophylaxis: lovenox   Family Communication: none   Severity of Illness: The appropriate patient status for this patient is OBSERVATION. Observation status is judged to be reasonable and necessary in order to provide the required intensity of service to ensure the patient's safety. The patient's presenting symptoms, physical exam findings, and initial radiographic and laboratory data in the context of their medical condition is felt to place them at decreased risk for further clinical deterioration. Furthermore, it is anticipated that the patient will be  medically stable for discharge from the hospital within 2 midnights of admission.   Author: Orland Mustard, MD 06/19/2022 7:16 PM  For on call review [email or web address].

## 2022-06-19 NOTE — ED Triage Notes (Signed)
Pt c/o rle swelling x 3 days. Seen at ED at [facility] and was given fluid pill. Seen pcp yesterday and was given abx but sent her for possible blood clot. Redness and warmth noted with swellling to rle. Lle wnl. Pt a/o to most. Nad.

## 2022-06-19 NOTE — Assessment & Plan Note (Signed)
Unsure if he is still followed for this. Follow up outpatient surveillance

## 2022-06-19 NOTE — Assessment & Plan Note (Addendum)
Stable, continue to follow  Iron studies pending

## 2022-06-19 NOTE — Assessment & Plan Note (Addendum)
Appears to not be taking aricept  Delirium precautions

## 2022-06-19 NOTE — Assessment & Plan Note (Addendum)
Unsure of CKD, appears to be stage 2-3 UA pending LR bolus +gentle, time limited IVF Strict I/O Bladder scan showed 384L in and out cath and monitor output  Check psa, may have BPH.  Trend

## 2022-06-19 NOTE — Assessment & Plan Note (Signed)
Declines statin 

## 2022-06-20 ENCOUNTER — Observation Stay (HOSPITAL_COMMUNITY): Payer: Medicare Other

## 2022-06-20 DIAGNOSIS — L03115 Cellulitis of right lower limb: Secondary | ICD-10-CM | POA: Diagnosis not present

## 2022-06-20 DIAGNOSIS — I714 Abdominal aortic aneurysm, without rupture, unspecified: Secondary | ICD-10-CM

## 2022-06-20 DIAGNOSIS — N179 Acute kidney failure, unspecified: Secondary | ICD-10-CM

## 2022-06-20 DIAGNOSIS — I10 Essential (primary) hypertension: Secondary | ICD-10-CM

## 2022-06-20 LAB — LIPID PANEL
Cholesterol: 142 mg/dL (ref 0–200)
HDL: 33 mg/dL — ABNORMAL LOW (ref >40)
LDL Cholesterol: 91 mg/dL (ref 0–99)
Total CHOL/HDL Ratio: 4.3 RATIO
Triglycerides: 92 mg/dL (ref <150)
VLDL: 18 mg/dL (ref 0–40)

## 2022-06-20 LAB — CBC
HCT: 31.4 % — ABNORMAL LOW (ref 39.0–52.0)
Hemoglobin: 10.2 g/dL — ABNORMAL LOW (ref 13.0–17.0)
MCH: 30.3 pg (ref 26.0–34.0)
MCHC: 32.5 g/dL (ref 30.0–36.0)
MCV: 93.2 fL (ref 80.0–100.0)
Platelets: 276 10*3/uL (ref 150–400)
RBC: 3.37 MIL/uL — ABNORMAL LOW (ref 4.22–5.81)
RDW: 13.5 % (ref 11.5–15.5)
WBC: 6.4 10*3/uL (ref 4.0–10.5)
nRBC: 0 % (ref 0.0–0.2)

## 2022-06-20 LAB — BASIC METABOLIC PANEL
Anion gap: 8 (ref 5–15)
BUN: 23 mg/dL (ref 8–23)
CO2: 24 mmol/L (ref 22–32)
Calcium: 8.3 mg/dL — ABNORMAL LOW (ref 8.9–10.3)
Chloride: 104 mmol/L (ref 98–111)
Creatinine, Ser: 1.04 mg/dL (ref 0.61–1.24)
GFR, Estimated: >60 mL/min (ref >60)
Glucose, Bld: 79 mg/dL (ref 70–99)
Potassium: 3.2 mmol/L — ABNORMAL LOW (ref 3.5–5.1)
Sodium: 136 mmol/L (ref 135–145)

## 2022-06-20 LAB — PSA: Prostatic Specific Antigen: 0.71 ng/mL (ref 0.00–4.00)

## 2022-06-20 LAB — FERRITIN: Ferritin: 450 ng/mL — ABNORMAL HIGH (ref 24–336)

## 2022-06-20 LAB — IRON AND TIBC
Iron: 18 ug/dL — ABNORMAL LOW (ref 45–182)
Saturation Ratios: 9 % — ABNORMAL LOW (ref 17.9–39.5)
TIBC: 196 ug/dL — ABNORMAL LOW (ref 250–450)
UIBC: 178 ug/dL

## 2022-06-20 LAB — VITAMIN B12: Vitamin B-12: 5505 pg/mL — ABNORMAL HIGH (ref 180–914)

## 2022-06-20 MED ORDER — CEPHALEXIN 500 MG PO CAPS: 500.0000 mg | ORAL_CAPSULE | Freq: Four times a day (QID) | ORAL | 0 refills | Status: DC

## 2022-06-20 MED ORDER — CEPHALEXIN 500 MG PO CAPS: 500.0000 mg | ORAL_CAPSULE | Freq: Four times a day (QID) | ORAL | Status: DC

## 2022-06-20 MED ORDER — POTASSIUM CHLORIDE CRYS ER 20 MEQ PO TBCR
40.0000 meq | EXTENDED_RELEASE_TABLET | Freq: Once | ORAL | Status: AC
  Administered 2022-06-20: 40 meq via ORAL
  Filled 2022-06-20: qty 2

## 2022-06-20 NOTE — TOC Progression Note (Signed)
Transition of Care [facility]) - Progression Note    Patient Details  Name: Alan Meyers MRN: [id number] Date of Birth: June 18, 1935  Transition of Care [facility]) CM/SW Contact  Karn Cassis, [place] Phone Number: 06/20/2022, 9:17 AM  Clinical Narrative:   Transition of Care [facility]) Screening Note   Patient Details  Name: Alan Meyers Date of Birth: Jul 17, 1935   Transition of Care [facility]) CM/SW Contact:    Karn Cassis, LCSW Phone Number: 06/20/2022, 9:17 AM    Transition of Care Department [facility] has reviewed patient and no TOC needs have been identified at this time. We will continue to monitor patient advancement through interdisciplinary progression rounds. If new patient transition needs arise, please place a TOC consult.            Expected Discharge Plan and Services         Expected Discharge Date: 06/20/22                                     Social Determinants of Health (SDOH) Interventions SDOH Screenings   Tobacco Use: Medium Risk (06/19/2022)    Readmission Risk Interventions     No data to display

## 2022-06-20 NOTE — Progress Notes (Signed)
Patient discharged, accompanied by daughter. Discharge instructions explained, patient verbally expressed understanding, patient In stable condition at time of discharge, and all belongings returned.

## 2022-06-20 NOTE — Hospital Course (Signed)
87 year old male with a history of hypertension, hyperlipidemia, carotid stenosis status post left carotid endarterectomy September 2014, AAA, stroke, dementia presenting with right lower extremity pain, edema, and erythema.  The patient is a poor historian secondary to his dementia.  History is supplemented by the patient's daughter.  The patient first mentioned pain, edema, and erythema to his daughter on 06/16/2022.  The patient was taken to the emergency department at [facility].  The patient was given a dose of intravenous furosemide, and he was sent home with prescription for furosemide 20 mg daily.  The patient follow-up with his PCP on 06/18/2022.  The patient was given a prescription for Augmentin and told to go to the emergency department for further evaluation of his right lower extremity.  There was concern for DVT.  Subsequently, he presented on 06/19/2022 to the ED at any time with the above complaints. He denies any fevers, chills, chest pain, short of breath, abdominal pain, nausea, vomiting. In the ED, the patient had a low-grade temperature of 99.1 F.  WBC 9.6, hemoglobin 11.9, platelets 218,000.  LFTs were unremarkable.  Sodium 135, potassium 3.7, serum creatinine 1.30.  Lactic acid 0.7.  EKG was sinus rhythm without ST/T wave changes.  X-ray of the right foot was negative for any osseous abnormality. Venous duplex of the right lower extremity was NEGATIVE for DVT.  The patient was started on cefazolin. Over the next 24 hours, his right lower extremity edema, erythema, and pain improved.  He remained afebrile and hemodynamically stable.  The patient will be discharged home with a prescription for cephalexin.  His hospital stay, the patient did exhibit sundowning on the evening of 06/19/2022.  The patient did complain of some symptoms of urinary retention.  However his In-N-Out cath only showed 225 cc.  UA was negative for pyuria or hematuria.  Subsequently, the patient was able to urinate on his  own.

## 2022-06-20 NOTE — Evaluation (Signed)
Physical Therapy Evaluation Patient Details Name: Alan Meyers MRN: [id number] DOB: 1935/03/13 Today's Date: 06/20/2022  History of Present Illness  Louis Ivery is a 87 y.o. male with medical history significant of HTN, HLD, PAD, peripheral neuropathy, AAA, CKD, hx of right basal ganglia CVA presenting for right leg swelling.  He went to [facility] ED on 4/20 with complaints of bilateral LE edema. Treated with lasix and advised to follow up with PCP. Saw PCP yesterday who started him on augmentin. He was told by his PCP to go to the ER yesterday to rule out DVT. He came today. He is a very poor historian. He tells me he went to urgent care because it was swollen and hurting. It continued to be swollen so that prompted him to go to his PCP. He denies any fever/chills. He has been eating and drinking okay. He is able to ambulate on his foot, but states sometimes it's painful. He states his feet do get cold and can change colors to a light brown.   Clinical Impression  Patient functioning near baseline for functional mobility and gait demonstrating good return for bed mobility, transferring to/from chair, commode in bathroom and ambulating in room/hallways without loss of balance or need for an assistive device.  Plan:  Patient discharged from physical therapy to care of nursing for ambulation daily as tolerated for length of stay.         Recommendations for follow up therapy are one component of a multi-disciplinary discharge planning process, led by the attending physician.  Recommendations may be updated based on patient status, additional functional criteria and insurance authorization.  Follow Up Recommendations       Assistance Recommended at Discharge Set up Supervision/Assistance  Patient can return home with the following  Help with stairs or ramp for entrance    Equipment Recommendations None recommended by PT  Recommendations for Other Services       Functional Status Assessment  Patient has not had a recent decline in their functional status     Precautions / Restrictions Precautions Precautions: None Restrictions Weight Bearing Restrictions: No      Mobility  Bed Mobility Overal bed mobility: Modified Independent                  Transfers Overall transfer level: Modified independent                      Ambulation/Gait Ambulation/Gait assistance: Modified independent (Device/Increase time) Gait Distance (Feet): 120 Feet Assistive device: None Gait Pattern/deviations: WFL(Within Functional Limits) Gait velocity: decreased     General Gait Details: grossly  WFL with good return for ambulating in room/hallways without loss of balance without requiring need for an AD  Stairs            Wheelchair Mobility    Modified Rankin (Stroke Patients Only)       Balance Overall balance assessment: No apparent balance deficits (not formally assessed)                                           Pertinent Vitals/Pain Pain Assessment Pain Assessment: No/denies pain    Home Living Family/patient expects to be discharged to:: Private residence Living Arrangements: Alone Available Help at Discharge: Family;Available PRN/intermittently Type of Home: House Home Access: Stairs to enter Entrance Stairs-Rails: None Entrance Stairs-Number of Steps: 4-5  Home Layout: One level Home Equipment: Agricultural consultant (2 wheels);Cane - single point;Shower seat;BSC/3in1 Additional Comments: info per patient who states he has memory problems    Prior Function Prior Level of Function : Independent/Modified Independent             Mobility Comments: Tourist information centre manager without AD, does not drive ADLs Comments: Assisted by family     Hand Dominance        Extremity/Trunk Assessment   Upper Extremity Assessment Upper Extremity Assessment: Overall WFL for tasks assessed    Lower Extremity Assessment Lower Extremity  Assessment: Overall WFL for tasks assessed    Cervical / Trunk Assessment Cervical / Trunk Assessment: Normal  Communication   Communication: No difficulties  Cognition Arousal/Alertness: Awake/alert Behavior During Therapy: WFL for tasks assessed/performed Overall Cognitive Status: Within Functional Limits for tasks assessed                                          General Comments      Exercises     Assessment/Plan    PT Assessment Patient does not need any further PT services  PT Problem List         PT Treatment Interventions      PT Goals (Current goals can be found in the Care Plan section)  Acute Rehab PT Goals Patient Stated Goal: return home with family to assist PT Goal Formulation: With patient Time For Goal Achievement: 06/20/22 Potential to Achieve Goals: Good    Frequency       Co-evaluation               AM-PAC PT "6 Clicks" Mobility  Outcome Measure Help needed turning from your back to your side while in a flat bed without using bedrails?: None Help needed moving from lying on your back to sitting on the side of a flat bed without using bedrails?: None Help needed moving to and from a bed to a chair (including a wheelchair)?: None Help needed standing up from a chair using your arms (e.g., wheelchair or bedside chair)?: None Help needed to walk in hospital room?: None Help needed climbing 3-5 steps with a railing? : A Little 6 Click Score: 23    End of Session   Activity Tolerance: Patient tolerated treatment well Patient left: in chair;with call bell/phone within reach Nurse Communication: Mobility status PT Visit Diagnosis: Unsteadiness on feet (R26.81);Other abnormalities of gait and mobility (R26.89);Muscle weakness (generalized) (M62.81)    Time: 9604-5409 PT Time Calculation (min) (ACUTE ONLY): 20 min   Charges:   PT Evaluation $PT Eval Moderate Complexity: 1 Mod PT Treatments $Therapeutic Activity: 8-22  mins        10:25 AM, 06/20/22 Ocie Bob, MPT Physical Therapist with [facility] [phone] office [phone] mobile phone

## 2022-06-20 NOTE — Discharge Summary (Signed)
Physician Discharge Summary   Patient: Alan Meyers MRN: [id number] DOB: 27-Nov-1935  Admit date:     06/19/2022  Discharge date: 06/20/22  Discharge Physician: Onalee Hua Aunika Kirsten   PCP: Donetta Potts, MD   Recommendations at discharge:   Please follow up with primary care provider within 1-2 weeks  Please repeat BMP and CBC in one week     Hospital Course: 87 year old male with a history of hypertension, hyperlipidemia, carotid stenosis status post left carotid endarterectomy September 2014, AAA, stroke, dementia presenting with right lower extremity pain, edema, and erythema.  The patient is a poor historian secondary to his dementia.  History is supplemented by the patient's daughter.  The patient first mentioned pain, edema, and erythema to his daughter on 06/16/2022.  The patient was taken to the emergency department at [facility].  The patient was given a dose of intravenous furosemide, and he was sent home with prescription for furosemide 20 mg daily.  The patient follow-up with his PCP on 06/18/2022.  The patient was given a prescription for Augmentin and told to go to the emergency department for further evaluation of his right lower extremity.  There was concern for DVT.  Subsequently, he presented on 06/19/2022 to the ED at any time with the above complaints. He denies any fevers, chills, chest pain, short of breath, abdominal pain, nausea, vomiting. In the ED, the patient had a low-grade temperature of 99.1 F.  WBC 9.6, hemoglobin 11.9, platelets 218,000.  LFTs were unremarkable.  Sodium 135, potassium 3.7, serum creatinine 1.30.  Lactic acid 0.7.  EKG was sinus rhythm without ST/T wave changes.  X-ray of the right foot was negative for any osseous abnormality. Venous duplex of the right lower extremity was NEGATIVE for DVT.  The patient was started on cefazolin. Over the next 24 hours, his right lower extremity edema, erythema, and pain improved.  He remained afebrile and hemodynamically stable.   The patient will be discharged home with a prescription for cephalexin.  His hospital stay, the patient did exhibit sundowning on the evening of 06/19/2022.  The patient did complain of some symptoms of urinary retention.  However his In-N-Out cath only showed 225 cc.  UA was negative for pyuria or hematuria.  Subsequently, the patient was able to urinate on his own.  Assessment and Plan:  Cellulitis right lower extremity -Overall improved with 3 doses of IV cefazolin during the hospitalization -Discharge home with cephalexin x 6 more days -Venous duplex--negative for DVT -Suspect the patient has a degree of PAD--I discussed with the patient's daughter for him to follow-up with Dr. Edilia Bo who performed the patient's carotid endarterectomy previously  Acute kidney injury -Baseline creatinine 0.8-0.9 -Presented with serum creatinine 1.30 -Patient was on furosemide prior to admission -The patient was given IV fluids with improvement -Serum creatinine 1.04 on the day of discharge  Urinary retention -The patient initially had In-N-Out cath removing 400 -Subsequently able to urinate -UA negative for pyuria or hematuria  Essential hypertension -Stable -Not on any medications presently  Hypokalemia -repleted  History of stroke -12/13/2021 MRI brain chronic lacunar ischemic infarcts in the right frontal lobe -LDL 91 -continue ASA -Previously declined statin therapy  Dementia without behavioral disturbance -No longer taking Aricept -Follow-up neurology  AAA (abdominal aortic aneurysm) without rupture-6.1 cm Unsure if he is still followed for this. Follow up outpatient surveillance--discussed with daughter--follow-up with vascular surgery, Dr. Edilia Bo        Consultants: none Procedures performed: none  Disposition: Home Diet recommendation:  Cardiac diet DISCHARGE MEDICATION: Allergies as of 06/20/2022   No Known Allergies      Medication List     STOP taking  these medications    amoxicillin-clavulanate 875-125 MG tablet Commonly known as: AUGMENTIN   donepezil 10 MG tablet Commonly known as: ARICEPT   donepezil 5 MG tablet Commonly known as: ARICEPT       TAKE these medications    aspirin 81 MG tablet Take 81 mg by mouth daily.   BL VITAMIN B-12 PO Take 1 tablet by mouth daily.   cephALEXin 500 MG capsule Commonly known as: KEFLEX Take 1 capsule (500 mg total) by mouth every 6 (six) hours.   dicyclomine 10 MG capsule Commonly known as: BENTYL Take 10 mg by mouth 2 (two) times daily as needed.   multivitamin tablet Take 1 tablet by mouth daily.   Vitamin D3 25 MCG (1000 UT) Caps Take 1 capsule by mouth in the morning.        Discharge Exam: Filed Weights   06/19/22 1735  Weight: 57.4 kg   HEENT:  [place]/AT, No thrush, no icterus CV:  RRR, no rub, no S3, no S4 Lung: diminished BS, but CTA, no wheeze, no rhonchi Abd:  soft/+BS, NT Ext:  RLE with mild erythema dorsum right foot to ankle.  No crepitance.  No open wounds   Condition at discharge: stable  The results of significant diagnostics from this hospitalization (including imaging, microbiology, ancillary and laboratory) are listed below for reference.   Imaging Studies: DG Foot Complete Right  Result Date: 06/19/2022 CLINICAL DATA:  Plantar wound.  Swelling EXAM: RIGHT FOOT COMPLETE - 3 VIEW COMPARISON:  None Available. FINDINGS: Small well corticated plantar calcaneal spur. No fracture or dislocation. Preserved bone mineralization. No underlying bony erosive changes at this time. Please correlate for the exact location of the wound. If there is further concern of bone or soft tissue infection, cross-sectional imaging can be considered as clinically appropriate IMPRESSION: Calcaneal spur.  No acute osseous abnormality Electronically Signed   By: Karen Kays M.D.   On: 06/19/2022 13:46   US Venous Img Lower Unilateral Right  Result Date: 06/19/2022 CLINICAL  DATA:  Right lower extremity pain and edema. Former smoker. Evaluate for DVT. EXAM: RIGHT LOWER EXTREMITY VENOUS DOPPLER ULTRASOUND TECHNIQUE: Gray-scale sonography with graded compression, as well as color Doppler and duplex ultrasound were performed to evaluate the lower extremity deep venous systems from the level of the common femoral vein and including the common femoral, femoral, profunda femoral, popliteal and calf veins including the posterior tibial, peroneal and gastrocnemius veins when visible. The superficial great saphenous vein was also interrogated. Spectral Doppler was utilized to evaluate flow at rest and with distal augmentation maneuvers in the common femoral, femoral and popliteal veins. COMPARISON:  None Available. FINDINGS: Contralateral Common Femoral Vein: Respiratory phasicity is normal and symmetric with the symptomatic side. No evidence of thrombus. Normal compressibility. Common Femoral Vein: No evidence of thrombus. Normal compressibility, respiratory phasicity and response to augmentation. Saphenofemoral Junction: No evidence of thrombus. Normal compressibility and flow on color Doppler imaging. Profunda Femoral Vein: No evidence of thrombus. Normal compressibility and flow on color Doppler imaging. Femoral Vein: No evidence of thrombus. Normal compressibility, respiratory phasicity and response to augmentation. Popliteal Vein: No evidence of thrombus. Normal compressibility, respiratory phasicity and response to augmentation. Calf Veins: No evidence of thrombus. Normal compressibility and flow on color Doppler imaging. Superficial Great Saphenous Vein: No evidence of thrombus. Normal compressibility. Other  Findings: There is a minimal amount of eccentric echogenic plaque seen within the both the right and left common femoral arteries. Scattered echogenic plaque is seen throughout the right superficial femoral and popliteal arteries. Note is made of a non pathologically enlarged right  inguinal lymph node which measures 0.7 cm in greatest short axis diameter, presumably reactive in etiology. IMPRESSION: No evidence of DVT within the right lower extremity. Electronically Signed   By: Simonne Come M.D.   On: 06/19/2022 10:27    Microbiology: Results for orders placed or performed during the hospital encounter of 10/22/12  Surgical pcr screen     Status: None   Collection Time: 10/22/12  1:11 PM   Specimen: Nasal Mucosa; Nasal Swab  Result Value Ref Range Status   MRSA, PCR NEGATIVE NEGATIVE Final   Staphylococcus aureus NEGATIVE NEGATIVE Final    Comment:        The Xpert SA Assay (FDA approved for NASAL specimens in patients over 51 years of age), is one component of a comprehensive surveillance program.  Test performance has been validated by [employer] for patients greater than or equal to 59 year old. It is not intended to diagnose infection nor to guide or monitor treatment.    Labs: CBC: Recent Labs  Lab 06/19/22 1033 06/20/22 0415  WBC 9.6 6.4  NEUTROABS 7.6  --   HGB 11.9* 10.2*  HCT 36.1* 31.4*  MCV 93.5 93.2  PLT 318 276   Basic Metabolic Panel: Recent Labs  Lab 06/19/22 1033 06/20/22 0415  NA 135 136  K 3.7 3.2*  CL 99 104  CO2 24 24  GLUCOSE 98 79  BUN 32* 23  CREATININE 1.30* 1.04  CALCIUM 9.0 8.3*   Liver Function Tests: Recent Labs  Lab 06/19/22 1033  AST 25  ALT 22  ALKPHOS 76  BILITOT 1.1  PROT 7.7  ALBUMIN 3.2*   CBG: No results for input(s): "GLUCAP" in the last 168 hours.  Discharge time spent: greater than 30 minutes.  Signed: Catarina Hartshorn, MD [facility] 06/20/2022

## 2022-06-20 NOTE — Progress Notes (Signed)
At approx. 19:30: Was called to the room by tele stating that the leads were off. Upon arriving to room, pt. Pulling off leads, stating that he doesn't need these anymore. Pt. Had pulled out the peripheral IV also, noted that IV was laying in the bed with a small amount of blood on bed cover. Pt. Disoriented to location, time, and situation. Pt. Only able to tell his name and birthday. Placed pt. Back on tele monitor and a peripheral IV was placed.   At approx. 20:00: Was called again several other times by tele stating that all leads are off and upon arriving, pt. Attempting to get out of the bed, stating he wants to go home. Pt. Reoriented and placed pt. Back on tele. Bed alarm on, bed in lowest position, fall mats in place, non-skid socks on, fall risk bracelet on, and call light and belongings within pt.'s reach. Pt. Instructed/educated on the importance of using the call bell and not attempting to get out of bed himself for his own safety. Pt. Instructed/educated on maintaining tele monitor in place also.   At approx 21:30: Nurse tech informed nurse that pt. Had removed leads again and is refusing to apply them back on. Went to try to place tele monitor back on, however, pt. Strongly refusing. MD made aware.   At approx. 22:00: Asked pt. If he could stan to try to urinate in urinal or toilet and pt.. stood with assistance of nurse, but did not urinate and states, "I can't get it to come out." Bladder scan performed on patient, scan volume indicates 218 mL. MD informed. In and out cath order placed by MD. In and out cathed pt. And of urine removed. Peri-care performed after. MD notified of output.   1:46am: Pt. Lying in bed with eyes closed resting.   Attempted to place pt. Back on tele monitor a couple times, however pt. Still refused.  Around 5:00am: placed pt. Back on tele monitor, pt. Agreed and allowed nurse to apply leads and monitor.

## 2022-06-25 ENCOUNTER — Ambulatory Visit: Payer: Medicare HMO | Admitting: Psychiatry

## 2022-07-02 DIAGNOSIS — L0390 Cellulitis, unspecified: Secondary | ICD-10-CM | POA: Diagnosis not present

## 2022-07-02 DIAGNOSIS — Z6820 Body mass index (BMI) 20.0-20.9, adult: Secondary | ICD-10-CM | POA: Diagnosis not present

## 2022-07-04 ENCOUNTER — Ambulatory Visit (INDEPENDENT_AMBULATORY_CARE_PROVIDER_SITE_OTHER): Payer: Medicare Other

## 2022-07-04 ENCOUNTER — Encounter: Payer: Self-pay | Admitting: Podiatry

## 2022-07-04 ENCOUNTER — Ambulatory Visit: Payer: Medicare Other | Admitting: Podiatry

## 2022-07-04 DIAGNOSIS — I739 Peripheral vascular disease, unspecified: Secondary | ICD-10-CM

## 2022-07-04 DIAGNOSIS — L03115 Cellulitis of right lower limb: Secondary | ICD-10-CM

## 2022-07-04 DIAGNOSIS — R609 Edema, unspecified: Secondary | ICD-10-CM | POA: Diagnosis not present

## 2022-07-04 DIAGNOSIS — L97513 Non-pressure chronic ulcer of other part of right foot with necrosis of muscle: Secondary | ICD-10-CM | POA: Diagnosis not present

## 2022-07-04 MED ORDER — DOXYCYCLINE HYCLATE 100 MG PO TABS: 100.0000 mg | ORAL_TABLET | Freq: Two times a day (BID) | ORAL | 0 refills | Status: DC

## 2022-07-04 NOTE — Progress Notes (Signed)
  Subjective:  Patient ID: Alan Meyers, male    DOB: September 21, 1935,   MRN: [id number]  Chief Complaint  Patient presents with   Foot Ulcer    Ulcer right foot on going for 3-4 weeks. Patient daughter states she took him to the Ed they put him on fluid pills and then went to the PCP and they took him off of the fluid pills.    Foot Swelling    Bilateral foot swelling     87 y.o. male presents for concern as above. Patient was seen in the ED and X-rays were negative was sent home on cephalexin. Relates she has been soaking foot in epsom salts and putting peroxide on the toe. Not dressing the foot.  Currently on keflex and doxycycline according to daughter. Does have a history of PAD. Marland Kitchen Denies any other pedal complaints. Denies n/v/f/c.   Past Medical History:  Diagnosis Date   AAA (abdominal aortic aneurysm) (HCC)    6.1 - 6.2cm   Anemia    Diarrhea, functional 08/01/2012   Headache(784.0)    Hemorrhoids    Hyperlipidemia    Hypertension    no meds for sev years   Memory loss    Peripheral arterial disease (HCC)    Peripheral neuropathy    Plantar warts 05/29/2012   Pulsatile abdominal mass 08/01/2012    Objective:  Physical Exam: Vascular: DP/PT pulses 2/4 bilateral. CFT <3 seconds. Normal hair growth on digits. No edema.  Skin. No lacerations or abrasions bilateral feet. Ulceration noted to plantar first metatarsal head area about 0.5 cm x 0.5 cm x 0.6 cm with probe to bone. No purulence noted. Mild erythema and edema noted to dorsum of foot. No fluctance noted.  Musculoskeletal: MMT 5/5 bilateral lower extremities in DF, PF, Inversion and Eversion. Deceased ROM in DF of ankle joint.  Neurological: Sensation intact to light touch.   Assessment:   1. 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 Cellulitis of right leg   3. Peripheral artery disease (HCC)   4. Swelling [R60.9]      Plan:  Patient was evaluated and treated and all questions answered. Ulcer right  foot with necrosis of bone possible  -Debridement as below. -Dressed with betadine, DSD. -Off-loading with surgical shoe.Dispensed -Continue doxycycline and cephalexin. Refill of doxycycline x 14 days provided.  -X-rays reivewed and show osseous erosion noted to medial first metatasal and proximal phalanx of the hallux and concern for osteomyelitis.  Discussed concerns for bone infection with patient and treatments. Discussed patient will likely need amputation of this toe.  Will order MRI for further evaluation.  ABIs to evaluate for previous PVD.  -Discussed glucose control and proper protein-rich diet.  -Discussed if any worsening redness, pain, fever or chills to call or may need to report to the emergency room. Patient expressed understanding.   Procedure: Excisional Debridement of Wound Rationale: Removal of non-viable soft tissue from the wound to promote healing.  Anesthesia: none Pre-Debridement Wound Measurements: Ovelrying callus  Post-Debridement Wound Measurements: 0.5 cm x 0.5 cm x 0.6 cm  Type of Debridement: Sharp Excisional Tissue Removed: Non-viable soft tissue Depth of Debridement: subcutaneous tissue. Technique: Sharp excisional debridement to bleeding, viable wound base.  Dressing: Dry, sterile, compression dressing. Disposition: Patient tolerated procedure well. Patient to return in 1 week for follow-up.  Return in about 1 week (around 07/11/2022) for wound check.   Louann Sjogren, DPM

## 2022-07-05 DIAGNOSIS — Z6820 Body mass index (BMI) 20.0-20.9, adult: Secondary | ICD-10-CM | POA: Diagnosis not present

## 2022-07-05 DIAGNOSIS — L0390 Cellulitis, unspecified: Secondary | ICD-10-CM | POA: Diagnosis not present

## 2022-07-05 DIAGNOSIS — M79671 Pain in right foot: Secondary | ICD-10-CM | POA: Diagnosis not present

## 2022-07-05 DIAGNOSIS — R600 Localized edema: Secondary | ICD-10-CM | POA: Diagnosis not present

## 2022-07-05 DIAGNOSIS — I729 Aneurysm of unspecified site: Secondary | ICD-10-CM | POA: Diagnosis not present

## 2022-07-06 ENCOUNTER — Encounter (HOSPITAL_COMMUNITY): Payer: Self-pay

## 2022-07-06 ENCOUNTER — Other Ambulatory Visit: Payer: Self-pay

## 2022-07-06 ENCOUNTER — Inpatient Hospital Stay (HOSPITAL_COMMUNITY)
Admission: EM | Admit: 2022-07-06 | Discharge: 2022-07-10 | DRG: 475 | Disposition: A | Discharge status: Home Health | Payer: Medicare Other | Attending: Family Medicine | Admitting: Family Medicine

## 2022-07-06 DIAGNOSIS — Z95820 Peripheral vascular angioplasty status with implants and grafts: Secondary | ICD-10-CM | POA: Diagnosis not present

## 2022-07-06 DIAGNOSIS — F01C4 Vascular dementia, severe, with anxiety: Secondary | ICD-10-CM | POA: Diagnosis present

## 2022-07-06 DIAGNOSIS — I69318 Other symptoms and signs involving cognitive functions following cerebral infarction: Secondary | ICD-10-CM

## 2022-07-06 DIAGNOSIS — Z831 Family history of other infectious and parasitic diseases: Secondary | ICD-10-CM | POA: Diagnosis not present

## 2022-07-06 DIAGNOSIS — Z8249 Family history of ischemic heart disease and other diseases of the circulatory system: Secondary | ICD-10-CM

## 2022-07-06 DIAGNOSIS — I69398 Other sequelae of cerebral infarction: Secondary | ICD-10-CM

## 2022-07-06 DIAGNOSIS — G629 Polyneuropathy, unspecified: Secondary | ICD-10-CM | POA: Diagnosis not present

## 2022-07-06 DIAGNOSIS — Z87891 Personal history of nicotine dependence: Secondary | ICD-10-CM | POA: Diagnosis not present

## 2022-07-06 DIAGNOSIS — M86171 Other acute osteomyelitis, right ankle and foot: Secondary | ICD-10-CM | POA: Diagnosis not present

## 2022-07-06 DIAGNOSIS — I10 Essential (primary) hypertension: Secondary | ICD-10-CM | POA: Diagnosis not present

## 2022-07-06 DIAGNOSIS — I714 Abdominal aortic aneurysm, without rupture, unspecified: Secondary | ICD-10-CM | POA: Diagnosis present

## 2022-07-06 DIAGNOSIS — B965 Pseudomonas (aeruginosa) (mallei) (pseudomallei) as the cause of diseases classified elsewhere: Secondary | ICD-10-CM | POA: Diagnosis not present

## 2022-07-06 DIAGNOSIS — D509 Iron deficiency anemia, unspecified: Secondary | ICD-10-CM | POA: Diagnosis present

## 2022-07-06 DIAGNOSIS — I739 Peripheral vascular disease, unspecified: Secondary | ICD-10-CM | POA: Diagnosis present

## 2022-07-06 DIAGNOSIS — R7989 Other specified abnormal findings of blood chemistry: Secondary | ICD-10-CM | POA: Diagnosis present

## 2022-07-06 DIAGNOSIS — Z8673 Personal history of transient ischemic attack (TIA), and cerebral infarction without residual deficits: Secondary | ICD-10-CM

## 2022-07-06 DIAGNOSIS — R000 Tachycardia, unspecified: Secondary | ICD-10-CM | POA: Diagnosis not present

## 2022-07-06 DIAGNOSIS — Z825 Family history of asthma and other chronic lower respiratory diseases: Secondary | ICD-10-CM

## 2022-07-06 DIAGNOSIS — E785 Hyperlipidemia, unspecified: Secondary | ICD-10-CM | POA: Diagnosis present

## 2022-07-06 DIAGNOSIS — D649 Anemia, unspecified: Secondary | ICD-10-CM | POA: Diagnosis not present

## 2022-07-06 DIAGNOSIS — I70202 Unspecified atherosclerosis of native arteries of extremities, left leg: Secondary | ICD-10-CM | POA: Diagnosis not present

## 2022-07-06 DIAGNOSIS — E1169 Type 2 diabetes mellitus with other specified complication: Secondary | ICD-10-CM | POA: Diagnosis not present

## 2022-07-06 DIAGNOSIS — Z8679 Personal history of other diseases of the circulatory system: Secondary | ICD-10-CM | POA: Diagnosis not present

## 2022-07-06 DIAGNOSIS — I7090 Unspecified atherosclerosis: Secondary | ICD-10-CM | POA: Diagnosis not present

## 2022-07-06 DIAGNOSIS — M869 Osteomyelitis, unspecified: Secondary | ICD-10-CM | POA: Diagnosis not present

## 2022-07-06 DIAGNOSIS — I70261 Atherosclerosis of native arteries of extremities with gangrene, right leg: Secondary | ICD-10-CM | POA: Diagnosis not present

## 2022-07-06 DIAGNOSIS — Z7982 Long term (current) use of aspirin: Secondary | ICD-10-CM

## 2022-07-06 DIAGNOSIS — M86671 Other chronic osteomyelitis, right ankle and foot: Secondary | ICD-10-CM | POA: Diagnosis not present

## 2022-07-06 DIAGNOSIS — R413 Other amnesia: Secondary | ICD-10-CM | POA: Diagnosis present

## 2022-07-06 DIAGNOSIS — L97516 Non-pressure chronic ulcer of other part of right foot with bone involvement without evidence of necrosis: Secondary | ICD-10-CM | POA: Diagnosis not present

## 2022-07-06 DIAGNOSIS — D500 Iron deficiency anemia secondary to blood loss (chronic): Secondary | ICD-10-CM | POA: Diagnosis present

## 2022-07-06 DIAGNOSIS — Z79899 Other long term (current) drug therapy: Secondary | ICD-10-CM | POA: Diagnosis not present

## 2022-07-06 HISTORY — DX: Unspecified dementia, unspecified severity, without behavioral disturbance, psychotic disturbance, mood disturbance, and anxiety: F03.90

## 2022-07-06 LAB — COMPREHENSIVE METABOLIC PANEL
ALT: 15 U/L (ref 0–44)
AST: 20 U/L (ref 15–41)
Albumin: 3 g/dL — ABNORMAL LOW (ref 3.5–5.0)
Alkaline Phosphatase: 77 U/L (ref 38–126)
Anion gap: 8 (ref 5–15)
BUN: 15 mg/dL (ref 8–23)
CO2: 25 mmol/L (ref 22–32)
Calcium: 8.7 mg/dL — ABNORMAL LOW (ref 8.9–10.3)
Chloride: 105 mmol/L (ref 98–111)
Creatinine, Ser: 1.11 mg/dL (ref 0.61–1.24)
GFR, Estimated: >60 mL/min (ref >60)
Glucose, Bld: 103 mg/dL — ABNORMAL HIGH (ref 70–99)
Potassium: 4.7 mmol/L (ref 3.5–5.1)
Sodium: 138 mmol/L (ref 135–145)
Total Bilirubin: 0.8 mg/dL (ref 0.3–1.2)
Total Protein: 7.2 g/dL (ref 6.5–8.1)

## 2022-07-06 LAB — CBC WITH DIFFERENTIAL/PLATELET
Abs Immature Granulocytes: 0.04 10*3/uL (ref 0.00–0.07)
Basophils Absolute: 0.1 10*3/uL (ref 0.0–0.1)
Basophils Relative: 1 %
Eosinophils Absolute: 0.4 10*3/uL (ref 0.0–0.5)
Eosinophils Relative: 4 %
HCT: 36.8 % — ABNORMAL LOW (ref 39.0–52.0)
Hemoglobin: 11.7 g/dL — ABNORMAL LOW (ref 13.0–17.0)
Immature Granulocytes: 0 %
Lymphocytes Relative: 16 %
Lymphs Abs: 1.6 10*3/uL (ref 0.7–4.0)
MCH: 29.7 pg (ref 26.0–34.0)
MCHC: 31.8 g/dL (ref 30.0–36.0)
MCV: 93.4 fL (ref 80.0–100.0)
Monocytes Absolute: 0.9 10*3/uL (ref 0.1–1.0)
Monocytes Relative: 9 %
Neutro Abs: 6.8 10*3/uL (ref 1.7–7.7)
Neutrophils Relative %: 70 %
Platelets: 369 10*3/uL (ref 150–400)
RBC: 3.94 MIL/uL — ABNORMAL LOW (ref 4.22–5.81)
RDW: 13.8 % (ref 11.5–15.5)
WBC: 9.8 10*3/uL (ref 4.0–10.5)
nRBC: 0 % (ref 0.0–0.2)

## 2022-07-06 LAB — C-REACTIVE PROTEIN: CRP: 2.7 mg/dL — ABNORMAL HIGH (ref <1.0)

## 2022-07-06 LAB — LACTIC ACID, PLASMA: Lactic Acid, Venous: 2.2 mmol/L (ref 0.5–1.9)

## 2022-07-06 LAB — SURGICAL PCR SCREEN
MRSA, PCR: NEGATIVE
Staphylococcus aureus: NEGATIVE

## 2022-07-06 LAB — SEDIMENTATION RATE: Sed Rate: 117 mm/hr — ABNORMAL HIGH (ref 0–16)

## 2022-07-06 MED ORDER — ASPIRIN 81 MG PO CHEW
81.0000 mg | CHEWABLE_TABLET | Freq: Every day | ORAL | Status: DC
  Administered 2022-07-08 – 2022-07-10 (×3): 81 mg via ORAL
  Filled 2022-07-06 (×3): qty 1

## 2022-07-06 MED ORDER — ONDANSETRON HCL 4 MG PO TABS: 4.0000 mg | ORAL_TABLET | Freq: Four times a day (QID) | ORAL | Status: DC | PRN

## 2022-07-06 MED ORDER — SODIUM CHLORIDE 0.9 % IV SOLN: INTRAVENOUS | Status: AC

## 2022-07-06 MED ORDER — HYDROCODONE-ACETAMINOPHEN 5-325 MG PO TABS
1.0000 | ORAL_TABLET | ORAL | Status: DC | PRN
  Administered 2022-07-07: 2 via ORAL
  Filled 2022-07-06: qty 2

## 2022-07-06 MED ORDER — ONDANSETRON HCL 4 MG/2ML IJ SOLN: 4.0000 mg | Freq: Four times a day (QID) | INTRAMUSCULAR | Status: DC | PRN

## 2022-07-06 MED ORDER — DICYCLOMINE HCL 10 MG PO CAPS: 10.0000 mg | ORAL_CAPSULE | Freq: Two times a day (BID) | ORAL | Status: DC | PRN

## 2022-07-06 NOTE — H&P (Signed)
History and Physical    Alan Meyers HKV:[id number] DOB: Dec 06, 1935 DOA: 07/06/2022  Referring MD/NP/PA: EDP PCP:  Patient coming from: Home  Chief Complaint: Abnormal MRI concerning for osteomyelitis  HPI: Alan Meyers is a 86/M with history of CVA, hypertension, PAD, mild memory loss, chronic right foot wound, recently hospitalized at [facility] 2 weeks ago for wound infection/cellulitis treated with IV Ancef, discharged on Keflex, subsequently saw Dr. Ralene Cork with podiatry on 5/8, wound was debrided, started on doxycycline and MRI was ordered, in the meantime PCP also ordered an MRI, completed yesterday results were concerning for osteomyelitis of right foot, subsequently advised to come to the ED. patient and family report ongoing issues with this right foot for months, redness and swelling in the right leg marginally better after starting antibiotics couple of days ago. ED Course: Afebrile, vital signs stable, labs with WBC of 9.8, creatinine 1.1, lactate 2.2, outside MRI report pending  Review of Systems: As per HPI otherwise 14 point review of systems negative.   Past Medical History:  Diagnosis Date   AAA (abdominal aortic aneurysm) (HCC)    6.1 - 6.2cm   Anemia    Dementia (HCC)    Diarrhea, functional 08/01/2012   Headache(784.0)    Hemorrhoids    Hyperlipidemia    Hypertension    no meds for sev years   Memory loss    Peripheral arterial disease (HCC)    Peripheral neuropathy    Plantar warts 05/29/2012   Pulsatile abdominal mass 08/01/2012    Past Surgical History:  Procedure Laterality Date   ABDOMINAL AORTAGRAM N/A 08/11/2012   Procedure: ABDOMINAL Ronny Flurry;  Surgeon: Chuck Hint, MD;  Location: [facility] CATH LAB;  Service: Cardiovascular;  Laterality: N/A;   ABDOMINAL AORTIC ENDOVASCULAR STENT GRAFT N/A 09/18/2012   Procedure: ABDOMINAL AORTIC ENDOVASCULAR STENT GRAFT;  Surgeon: Chuck Hint, MD;  Location: [facility] OR;  Service: Vascular;   Laterality: N/A;  Ultrasound guided   CATARACT EXTRACTION W/ INTRAOCULAR LENS  IMPLANT, BILATERAL     Hx; of   ENDARTERECTOMY Left 10/28/2012   Procedure: ENDARTERECTOMY CAROTID- LEFT;  Surgeon: Chuck Hint, MD;  Location[facility] OR;  Service: Vascular;  Laterality: Left;   EYE SURGERY Bilateral    cat   HEMORRHOID SURGERY  1970's     reports that he quit smoking about 19 years ago. His smoking use included cigarettes. He has a 60.00 pack-year smoking history. He has never used smokeless tobacco. He reports that he does not drink alcohol and does not use drugs.  No Known Allergies  Family History  Problem Relation Age of Onset   Tuberculosis Mother    Heart disease Father        Valvular heart disease and Congestive heart failure   Heart attack Father    Cancer Sister    COPD Brother    COPD Brother      Prior to Admission medications   Medication Sig Start Date End Date Taking? Authorizing Provider  aspirin 81 MG tablet Take 81 mg by mouth daily.   Yes [provider]  Cholecalciferol (VITAMIN D-3 PO) Take 1 capsule by mouth daily.   Yes [provider]  Cyanocobalamin (VITAMIN B-12 PO) Take 1 tablet by mouth daily.   Yes [provider]  dicyclomine (BENTYL) 10 MG capsule Take 10 mg by mouth 2 (two) times daily as needed for spasms. 05/29/22  Yes [provider]  doxycycline (VIBRA-TABS) 100 MG tablet Take 1 tablet (100  mg total) by mouth 2 (two) times daily for 14 days. 07/04/22 07/18/22 Yes Louann Sjogren, DPM  Multiple Vitamins-Minerals (CENTRUM SILVER 50+MEN) TABS Take 1 tablet by mouth daily.   Yes [provider]  cephALEXin (KEFLEX) 500 MG capsule Take 1 capsule (500 mg total) by mouth every 6 (six) hours. Patient not taking: Reported on 07/06/2022 06/20/22   Catarina Hartshorn, MD    Physical Exam: Vitals:   07/06/22 1444 07/06/22 1545 07/06/22 1630 07/06/22 1745  BP:  (!) 171/73 (!) 150/77 (!) 145/76  Pulse:  77 79 80  Resp:  20  20 18   Temp:      TempSrc:      SpO2:  98% 96% 98%  Weight: 57.2 kg     Height: 5\' 9"  (1.753 m)         Constitutional: NAD, calm, comfortable, AAOx2, mild cognitive deficits HEENT:no JVD Respiratory: Poor air movement bilaterally Cardiovascular: S1S2/RRR Abdomen: soft, non tender, Bowel sounds positive.  Musculoskeletal: No joint deformity upper and lower extremities. Ext: Right foot with erythema and swelling extending to mid tibia, wound on the dorsal surface of the foot with purulent drainage, surrounding skin sloughing Skin: As above Neurologic: Moves all extremities, no localizing signs Psychiatric: Flat affect, normal mood  Labs on Admission: I have personally reviewed following labs and imaging studies  CBC: Recent Labs  Lab 07/06/22 1511  WBC 9.8  NEUTROABS 6.8  HGB 11.7*  HCT 36.8*  MCV 93.4  PLT 369   Basic Metabolic Panel: Recent Labs  Lab 07/06/22 1511  NA 138  K 4.7  CL 105  CO2 25  GLUCOSE 103*  BUN 15  CREATININE 1.11  CALCIUM 8.7*   GFR: Estimated Creatinine Clearance: 40.1 mL/min (by C-G formula based on SCr of 1.11 mg/dL). Liver Function Tests: Recent Labs  Lab 07/06/22 1511  AST 20  ALT 15  ALKPHOS 77  BILITOT 0.8  PROT 7.2  ALBUMIN 3.0*   No results for input(s): "LIPASE", "AMYLASE" in the last 168 hours. No results for input(s): "AMMONIA" in the last 168 hours. Coagulation Profile: No results for input(s): "INR", "PROTIME" in the last 168 hours. Cardiac Enzymes: No results for input(s): "CKTOTAL", "CKMB", "CKMBINDEX", "TROPONINI" in the last 168 hours. BNP (last 3 results) No results for input(s): "PROBNP" in the last 8760 hours. HbA1C: No results for input(s): "HGBA1C" in the last 72 hours. CBG: No results for input(s): "GLUCAP" in the last 168 hours. Lipid Profile: No results for input(s): "CHOL", "HDL", "LDLCALC", "TRIG", "CHOLHDL", "LDLDIRECT" in the last 72 hours. Thyroid Function Tests: No results for input(s):  "TSH", "T4TOTAL", "FREET4", "T3FREE", "THYROIDAB" in the last 72 hours. Anemia Panel: No results for input(s): "VITAMINB12", "FOLATE", "FERRITIN", "TIBC", "IRON", "RETICCTPCT" in the last 72 hours. Urine analysis:    Component Value Date/Time   COLORURINE YELLOW 06/19/2022 1714   APPEARANCEUR HAZY (A) 06/19/2022 1714   LABSPEC 1.019 06/19/2022 1714   PHURINE 5.0 06/19/2022 1714   GLUCOSEU NEGATIVE 06/19/2022 1714   HGBUR NEGATIVE 06/19/2022 1714   BILIRUBINUR NEGATIVE 06/19/2022 1714   KETONESUR 5 (A) 06/19/2022 1714   PROTEINUR 30 (A) 06/19/2022 1714   UROBILINOGEN 0.2 10/22/2012 1312   NITRITE NEGATIVE 06/19/2022 1714   LEUKOCYTESUR NEGATIVE 06/19/2022 1714   Sepsis Labs: @LABRCNTIP (procalcitonin:4,lacticidven:4) )No results found for this or any previous visit (from the past 240 hour(s)).   Radiological Exams on Admission: No results found.  EKG: Independently reviewed.  Assessment/Plan  Osteomyelitis (HCC) of right foot Chronic wound -Official  MRI report pending, verbal report of osteomyelitis per PCP/family -Podiatry Dr. Lilian Kapur consulted, recommended n.p.o. after midnight for possible OR/debridement tomorrow, advised against antibiotics today unless septic/unstable -Vascular surgery Dr. Randie Heinz consulted, arterial duplex ultrasound added -He is afebrile with stable vital signs and nontoxic, mildly elevated lactic acid could be secondary to dehydration, add IV fluids today -check ESR/CRP    Iron deficiency anemia  -Stable hemoglobin, monitor    History of CVA (cerebrovascular accident) -resume ASA, intolerant to statin    Peripheral artery disease (HCC)    Memory loss Stopped taking aricept   DVT prophylaxis: SCDs Code Status: Full Code, per pt wishes Family Communication:  none present Disposition Plan: inpatient Consults called: Podiatry Dr.McDonald, [facility] Dr.Cain Admission status: Inpatient  Zannie Cove MD [facility]   07/06/2022, 6:05  PM

## 2022-07-06 NOTE — ED Triage Notes (Signed)
Pt arrives via POV with Daughter. Pt has had a wound on the bottom of his right foot for the past two to three weeks. He had an MRI yesterday which was concerning for osteomyelitis.

## 2022-07-06 NOTE — Progress Notes (Signed)
Pt admitted from the ED with Right foot ulcer followed by Podiatry, for possible debridement tomorrow, 5/11. Wound measurements 1.0 x 1.0 x 0.2 cm at present, no drainage or odor noted. Cleansed with saline. RLE reddened and warm to touch to almost the knee area. Sacral foam placed. Sacral area is reddened but blanchable.

## 2022-07-06 NOTE — Consult Note (Signed)
Hospital Consult    Reason for Consult:  Right foot osteo Referring Physician:  Dr. Particia Nearing MRN #:  [id number]  History of Present Illness: 87 y.o. male with history of carotid endarterectomy and aortic aneurysm repair more recently right foot infection has been evaluated by podiatry with plans for surgical intervention tomorrow.  Patient does walk does suffer from dementia and lives with his daughter.  Per the family at bedside he does take an aspirin.  He is a former smoker has hyperlipidemia hypertension.  Past Medical History:  Diagnosis Date   AAA (abdominal aortic aneurysm) (HCC)    6.1 - 6.2cm   Anemia    Dementia (HCC)    Diarrhea, functional 08/01/2012   Headache(784.0)    Hemorrhoids    Hyperlipidemia    Hypertension    no meds for sev years   Memory loss    Peripheral arterial disease (HCC)    Peripheral neuropathy    Plantar warts 05/29/2012   Pulsatile abdominal mass 08/01/2012    Past Surgical History:  Procedure Laterality Date   ABDOMINAL AORTAGRAM N/A 08/11/2012   Procedure: ABDOMINAL Ronny Flurry;  Surgeon: Chuck Hint, MD;  Location: [facility] CATH LAB;  Service: Cardiovascular;  Laterality: N/A;   ABDOMINAL AORTIC ENDOVASCULAR STENT GRAFT N/A 09/18/2012   Procedure: ABDOMINAL AORTIC ENDOVASCULAR STENT GRAFT;  Surgeon: Chuck Hint, MD;  Location: [facility] OR;  Service: Vascular;  Laterality: N/A;  Ultrasound guided   CATARACT EXTRACTION W/ INTRAOCULAR LENS  IMPLANT, BILATERAL     Hx; of   ENDARTERECTOMY Left 10/28/2012   Procedure: ENDARTERECTOMY CAROTID- LEFT;  Surgeon: Chuck Hint, MD;  Location: [facility] OR;  Service: Vascular;  Laterality: Left;   EYE SURGERY Bilateral    cat   HEMORRHOID SURGERY  1970's    No Known Allergies  Prior to Admission medications   Medication Sig Start Date End Date Taking? Authorizing Provider  aspirin 81 MG tablet Take 81 mg by mouth daily.    [provider]  cephALEXin (KEFLEX) 500 MG capsule Take 1  capsule (500 mg total) by mouth every 6 (six) hours. 06/20/22   Catarina Hartshorn, MD  Cholecalciferol (VITAMIN D3) 25 MCG (1000 UT) CAPS Take 1 capsule by mouth in the morning.    [provider]  Cyanocobalamin (BL VITAMIN B-12 PO) Take 1 tablet by mouth daily.    [provider]  dicyclomine (BENTYL) 10 MG capsule Take 10 mg by mouth 2 (two) times daily as needed. 05/29/22   [provider]  doxycycline (VIBRA-TABS) 100 MG tablet Take 1 tablet (100 mg total) by mouth 2 (two) times daily for 14 days. 07/04/22 07/18/22  Louann Sjogren, DPM  Multiple Vitamin (MULTIVITAMIN) tablet Take 1 tablet by mouth daily.    [provider]    Social History   Socioeconomic History   Marital status: Single    Spouse name: Not on file   Number of children: 2   Years of education: Not on file   Highest education level: Not on file  Occupational History   Not on file  Tobacco Use   Smoking status: Former    Packs/day: 1.50    Years: 40.00    Additional pack years: 0.00    Total pack years: 60.00    Types: Cigarettes    Quit date: 02/27/2003    Years since quitting: 19.3   Smokeless tobacco: Never  Substance and Sexual Activity   Alcohol use: No   Drug use: No  Sexual activity: Not on file  Other Topics Concern   Not on file  Social History Narrative   Lives alone   Social Determinants of Health   Financial Resource Strain: Not on file  Food Insecurity: Not on file  Transportation Needs: Not on file  Physical Activity: Not on file  Stress: Not on file  Social Connections: Not on file  Intimate Partner Violence: Not on file     Family History  Problem Relation Age of Onset   Tuberculosis Mother    Heart disease Father        Valvular heart disease and Congestive heart failure   Heart attack Father    Cancer Sister    COPD Brother    COPD Brother     Review of Systems  Constitutional: Negative.   HENT: Negative.    Eyes: Negative.   Respiratory:  Negative.    Cardiovascular: Negative.   Gastrointestinal: Negative.   Musculoskeletal: Negative.        Right foot wound with pus draining  Skin: Negative.   Neurological: Negative.   Endo/Heme/Allergies:  Bruises/bleeds easily.  Psychiatric/Behavioral:  Positive for memory loss.       Physical Examination  Vitals:   07/06/22 1434 07/06/22 1545  BP: (!) 159/90 (!) 171/73  Pulse: 79 77  Resp: (!) 22 20  Temp: 97.7 F (36.5 C)   SpO2: 95% 98%   Body mass index is 18.61 kg/m.  Physical Exam Constitutional:      Appearance: He is ill-appearing.  HENT:     Head: Normocephalic.     Nose: Nose normal.  Eyes:     Pupils: Pupils are equal, round, and reactive to light.  Cardiovascular:     Rate and Rhythm: Normal rate.     Pulses:          Femoral pulses are 2+ on the right side and 2+ on the left side.      Popliteal pulses are 2+ on the right side and 2+ on the left side.       Dorsalis pedis pulses are detected w/ Doppler on the right side and 0 on the left side.       Posterior tibial pulses are 0 on the right side and 0 on the left side.     Comments: Strong right peroneal signal appears to be his dominant runoff Pulmonary:     Effort: Pulmonary effort is normal.  Abdominal:     General: Abdomen is flat.     Comments: his aorta is easily palpable does not feel pulsatile  Musculoskeletal:        General: Normal range of motion.     Cervical back: Normal range of motion.  Skin:    General: Skin is warm.  Neurological:     General: No focal deficit present.     Mental Status: He is alert.     CBC    Component Value Date/Time   WBC 9.8 07/06/2022 1511   RBC 3.94 (L) 07/06/2022 1511   HGB 11.7 (L) 07/06/2022 1511   HCT 36.8 (L) 07/06/2022 1511   PLT 369 07/06/2022 1511   MCV 93.4 07/06/2022 1511   MCH 29.7 07/06/2022 1511   MCHC 31.8 07/06/2022 1511   RDW 13.8 07/06/2022 1511   LYMPHSABS 1.6 07/06/2022 1511   MONOABS 0.9 07/06/2022 1511   EOSABS 0.4  07/06/2022 1511   BASOSABS 0.1 07/06/2022 1511    BMET    Component Value Date/Time  NA 138 07/06/2022 1511   K 4.7 07/06/2022 1511   CL 105 07/06/2022 1511   CO2 25 07/06/2022 1511   GLUCOSE 103 (H) 07/06/2022 1511   BUN 15 07/06/2022 1511   CREATININE 1.11 07/06/2022 1511   CREATININE 1.00 08/18/2012 1330   CALCIUM 8.7 (L) 07/06/2022 1511   GFRNONAA >60 07/06/2022 1511   GFRAA >90 10/29/2012 0415    COAGS: Lab Results  Component Value Date   INR 0.92 10/22/2012   INR 1.06 09/18/2012   INR 0.97 09/12/2012     Non-Invasive Vascular Imaging:   ABI pending   MRI read pending   ASSESSMENT/PLAN: This is a 87 y.o. male with right foot infection consistent with chronic right lower extremity limb threatening ischemia.  Plan is for podiatry to remove infection tomorrow in the OR which I certainly think is reasonable.  We will follow this up with ABI and right lower extremity arterial duplex and consider angiography to improve healing if necessary.  This was discussed with the family at bedside.    Jimmy Plessinger C. Randie Heinz, MD [facility] Office: [phone] Pager: [phone]

## 2022-07-06 NOTE — ED Notes (Signed)
ED TO INPATIENT HANDOFF REPORT  ED Nurse Name and Phone #: Tori [phone]  S Name/Age/Gender Alan Meyers 87 y.o. male Room/Bed: 015C/015C  Code Status   Code Status: Full Code  Home/SNF/Other Home Patient oriented to: self Is this baseline? Yes   Triage Complete: Triage complete  Chief Complaint Osteomyelitis [facility]) [M86.9]  Triage Note Pt arrives via POV with Daughter. Pt has had a wound on the bottom of his right foot for the past two to three weeks. He had an MRI yesterday which was concerning for osteomyelitis.    Allergies No Known Allergies  Level of Care/Admitting Diagnosis ED Disposition     ED Disposition  Admit   Condition  --   Comment  Hospital Area: [facility] [[id number]]  Level of Care: Med-Surg [16]  May admit patient to [facility] or [facility] if equivalent level of care is available:: No  Covid Evaluation: Asymptomatic - no recent exposure (last 10 days) testing not required  Diagnosis: Osteomyelitis [facility]) [[id number]]  Admitting Physician: Zannie Cove [[id number]]  Attending Physician: Zannie Cove [[id number]]  Certification:: I certify this patient will need inpatient services for at least 2 midnights  Estimated Length of Stay: 4          B Medical/Surgery History Past Medical History:  Diagnosis Date   AAA (abdominal aortic aneurysm) (HCC)    6.1 - 6.2cm   Anemia    Dementia (HCC)    Diarrhea, functional 08/01/2012   Headache(784.0)    Hemorrhoids    Hyperlipidemia    Hypertension    no meds for sev years   Memory loss    Peripheral arterial disease (HCC)    Peripheral neuropathy    Plantar warts 05/29/2012   Pulsatile abdominal mass 08/01/2012   Past Surgical History:  Procedure Laterality Date   ABDOMINAL AORTAGRAM N/A 08/11/2012   Procedure: ABDOMINAL Ronny Flurry;  Surgeon: Chuck Hint, MD;  Location: [facility] CATH LAB;  Service: Cardiovascular;  Laterality: N/A;   ABDOMINAL AORTIC ENDOVASCULAR STENT GRAFT N/A  09/18/2012   Procedure: ABDOMINAL AORTIC ENDOVASCULAR STENT GRAFT;  Surgeon: Chuck Hint, MD;  Location: [facility] OR;  Service: Vascular;  Laterality: N/A;  Ultrasound guided   CATARACT EXTRACTION W/ INTRAOCULAR LENS  IMPLANT, BILATERAL     Hx; of   ENDARTERECTOMY Left 10/28/2012   Procedure: ENDARTERECTOMY CAROTID- LEFT;  Surgeon: Chuck Hint, MD;  Location: [facility] OR;  Service: Vascular;  Laterality: Left;   EYE SURGERY Bilateral    cat   HEMORRHOID SURGERY  1970's     A IV Location/Drains/Wounds Patient Lines/Drains/Airways Status     Active Line/Drains/Airways     Name Placement date Placement time Site Days   Peripheral IV 07/06/22 22 G Right Antecubital 07/06/22  1546  Antecubital  less than 1   Peripheral IV 07/06/22 22 G Anterior;Right Hand 07/06/22  1546  Hand  less than 1            Intake/Output Last 24 hours No intake or output data in the 24 hours ending 07/06/22 1822  Labs/Imaging Results for orders placed or performed during the hospital encounter of 07/06/22 (from the past 48 hour(s))  Comprehensive metabolic panel     Status: Abnormal   Collection Time: 07/06/22  3:11 PM  Result Value Ref Range   Sodium 138 135 - 145 mmol/L   Potassium 4.7 3.5 - 5.1 mmol/L   Chloride 105 98 - 111 mmol/L   CO2 25 22 - 32 mmol/L  Glucose, Bld 103 (H) 70 - 99 mg/dL    Comment: Glucose reference range applies only to samples taken after fasting for at least 8 hours.   BUN 15 8 - 23 mg/dL   Creatinine, Ser 1.61 0.61 - 1.24 mg/dL   Calcium 8.7 (L) 8.9 - 10.3 mg/dL   Total Protein 7.2 6.5 - 8.1 g/dL   Albumin 3.0 (L) 3.5 - 5.0 g/dL   AST 20 15 - 41 U/L   ALT 15 0 - 44 U/L   Alkaline Phosphatase 77 38 - 126 U/L   Total Bilirubin 0.8 0.3 - 1.2 mg/dL   GFR, Estimated >09 >60 mL/min    Comment: (NOTE) Calculated using the CKD-EPI Creatinine Equation (2021)    Anion gap 8 5 - 15    Comment: Performed at [facility] Lab, [address] [place], [place] [address]   CBC with Differential     Status: Abnormal   Collection Time: 07/06/22  3:11 PM  Result Value Ref Range   WBC 9.8 4.0 - 10.5 K/uL   RBC 3.94 (L) 4.22 - 5.81 MIL/uL   Hemoglobin 11.7 (L) 13.0 - 17.0 g/dL   HCT 81.1 (L) 91.4 - 78.2 %   MCV 93.4 80.0 - 100.0 fL   MCH 29.7 26.0 - 34.0 pg   MCHC 31.8 30.0 - 36.0 g/dL   RDW 95.6 21.3 - 08.6 %   Platelets 369 150 - 400 K/uL   nRBC 0.0 0.0 - 0.2 %   Neutrophils Relative % 70 %   Neutro Abs 6.8 1.7 - 7.7 K/uL   Lymphocytes Relative 16 %   Lymphs Abs 1.6 0.7 - 4.0 K/uL   Monocytes Relative 9 %   Monocytes Absolute 0.9 0.1 - 1.0 K/uL   Eosinophils Relative 4 %   Eosinophils Absolute 0.4 0.0 - 0.5 K/uL   Basophils Relative 1 %   Basophils Absolute 0.1 0.0 - 0.1 K/uL   Immature Granulocytes 0 %   Abs Immature Granulocytes 0.04 0.00 - 0.07 K/uL    Comment: Performed at [facility], [address], [place], [place] [address]  Lactic acid     Status: Abnormal   Collection Time: 07/06/22  3:11 PM  Result Value Ref Range   Lactic Acid, Venous 2.2 (HH) 0.5 - 1.9 mmol/L    Comment: CRITICAL RESULT CALLED TO, READ BACK BY AND VERIFIED WITH T VALDEZ RN AT 1630 [phone] BY D LONG Performed at [facility] Lab, [address], [place], [place] [address]    No results found.  Pending Labs Unresulted Labs (From admission, onward)     Start     Ordered   07/07/22 0500  Lactic acid, plasma  Tomorrow morning,   R        07/06/22 1818   07/06/22 1819  Sedimentation rate  Once,   R        07/06/22 1818   07/06/22 1819  C-reactive protein  Once,   R        07/06/22 1818   07/06/22 1456  Blood Cultures x 2 sites  BLOOD CULTURE X 2,   STAT      07/06/22 1456   Signed and Held  Hemoglobin A1c  Tomorrow morning,   R        Signed and Held   Signed and Held  CBC  Tomorrow morning,   R        Signed and Held   Signed and  Held  Comprehensive metabolic panel  Tomorrow morning,   R        Signed and Held            Vitals/Pain Today's  Vitals   07/06/22 1444 07/06/22 1545 07/06/22 1630 07/06/22 1745  BP:  (!) 171/73 (!) 150/77 (!) 145/76  Pulse:  77 79 80  Resp:  20 20 18   Temp:      TempSrc:      SpO2:  98% 96% 98%  Weight: 57.2 kg     Height: 5\' 9"  (1.753 m)     PainSc: 0-No pain       Isolation Precautions No active isolations  Medications Medications - No data to display  Mobility walks with person assist     Focused Assessments Pt has pulses in right foot   R Recommendations: See Admitting Provider Note  Report given to:   Additional Notes:  Pt is demented. Room air.

## 2022-07-06 NOTE — ED Provider Notes (Signed)
[place] EMERGENCY DEPARTMENT AT [facility] Provider Note   CSN: [id number] Arrival date & time: 07/06/22  1428     History  Chief Complaint  Patient presents with   Wound Infection   Osteomyelitis     Alan Meyers is a 87 y.o. male.  Pt is an 87 yo male with pmhx significant for PVD, HTN, HLD, PAD, peripheral neuropathy, CKD, hx right basal ganglia CVA, hx AAA (followed by vascular), dementia and a chronic wound to right foot.  He was admitted from 4/23-4/24 and received IV cefazolin and was d/c with keflex.  He's also been on doxy.  He did see Dr. Ralene Cork (podiatry on 5/8).  She debrided his wound and refilled his doxy.  She ordered a MRI which had not been scheduled yet.  His PCP ordered a MRI which was done yesterday.  Results were told to PCP today and were concerning for osteomyelitis.  PCP told pt to come to the ED.  Family said he's had redness to his right leg, but it's a little better after abx.                  Home Medications Prior to Admission medications   Medication Sig Start Date End Date Taking? Authorizing Provider  aspirin 81 MG tablet Take 81 mg by mouth daily.    [provider]  cephALEXin (KEFLEX) 500 MG capsule Take 1 capsule (500 mg total) by mouth every 6 (six) hours. 06/20/22   Catarina Hartshorn, MD  Cholecalciferol (VITAMIN D3) 25 MCG (1000 UT) CAPS Take 1 capsule by mouth in the morning.    [provider]  Cyanocobalamin (BL VITAMIN B-12 PO) Take 1 tablet by mouth daily.    [provider]  dicyclomine (BENTYL) 10 MG capsule Take 10 mg by mouth 2 (two) times daily as needed. 05/29/22   [provider]  doxycycline (VIBRA-TABS) 100 MG tablet Take 1 tablet (100 mg total) by mouth 2 (two) times daily for 14 days. 07/04/22 07/18/22  Louann Sjogren, DPM  Multiple Vitamin (MULTIVITAMIN) tablet Take 1 tablet by mouth daily.    [provider]      Allergies    Patient has no known allergies.    Review of  Systems   Review of Systems  Skin:  Positive for wound.  All other systems reviewed and are negative.   Physical Exam Updated Vital Signs BP (!) 150/77   Pulse 79   Temp 97.7 F (36.5 C) (Oral)   Resp 20   Ht 5\' 9"  (1.753 m)   Wt 57.2 kg   SpO2 96%   BMI 18.61 kg/m  Physical Exam Vitals and nursing note reviewed.  Constitutional:      Appearance: Normal appearance.  HENT:     Head: Normocephalic and atraumatic.     Right Ear: External ear normal.     Left Ear: External ear normal.     Nose: Nose normal.     Mouth/Throat:     Mouth: Mucous membranes are moist.     Pharynx: Oropharynx is clear.  Eyes:     Extraocular Movements: Extraocular movements intact.     Conjunctiva/sclera: Conjunctivae normal.     Pupils: Pupils are equal, round, and reactive to light.  Cardiovascular:     Rate and Rhythm: Normal rate and regular rhythm.     Pulses: Decreased pulses.     Heart sounds: Normal heart sounds.     Comments: Pulses in both feet  are not palpable, but are dopplerable Pulmonary:     Effort: Pulmonary effort is normal.     Breath sounds: Normal breath sounds.  Abdominal:     General: Abdomen is flat. Bowel sounds are normal.     Palpations: Abdomen is soft.  Musculoskeletal:     Cervical back: Normal range of motion and neck supple.     Comments: Wound to base of 1st toe.  See pictures.  Skin:    General: Skin is warm.     Capillary Refill: Capillary refill takes less than 2 seconds.     Comments: Swelling and redness to RLE  Neurological:     General: No focal deficit present.     Mental Status: He is alert. Mental status is at baseline.  Psychiatric:        Mood and Affect: Mood normal.        Behavior: Behavior normal.        ED Results / Procedures / Treatments   Labs (all labs ordered are listed, but only abnormal results are displayed) Labs Reviewed  COMPREHENSIVE METABOLIC PANEL - Abnormal; Notable for the following components:      Result Value    Glucose, Bld 103 (*)    Calcium 8.7 (*)    Albumin 3.0 (*)    All other components within normal limits  CBC WITH DIFFERENTIAL/PLATELET - Abnormal; Notable for the following components:   RBC 3.94 (*)    Hemoglobin 11.7 (*)    HCT 36.8 (*)    All other components within normal limits  LACTIC ACID, PLASMA - Abnormal; Notable for the following components:   Lactic Acid, Venous 2.2 (*)    All other components within normal limits  CULTURE, BLOOD (ROUTINE X 2)  CULTURE, BLOOD (ROUTINE X 2)    EKG EKG Interpretation  Date/Time:  Friday Jul 06 2022 16:30:16 EDT Ventricular Rate:  146 PR Interval:  139 QRS Duration: 123 QT Interval:  378 QTC Calculation: 458 R Axis:   -63 Text Interpretation: Poor data quality in current ECG precludes serial comparison Confirmed by [name] [phone]) on 07/06/2022 4:57:07 PM  Radiology No results found.  Procedures Procedures    Medications Ordered in ED Medications - No data to display  ED Course/ Medical Decision Making/ A&P                             Medical Decision Making Risk Decision regarding hospitalization.   This patient presents to the ED for concern of wound to foot, this involves an extensive number of treatment options, and is a complaint that carries with it a high risk of complications and morbidity.  The differential diagnosis includes osteomyelitis, cellulitis   Co morbidities that complicate the patient evaluation  PVD, HTN, HLD, PAD, peripheral neuropathy, CKD, hx right basal ganglia CVA, hx AAA (followed by vascular), dementia and a chronic wound to right foot   Additional history obtained:  Additional history obtained from epic chart review External records from outside source obtained and reviewed including family   Lab Tests:  I Ordered, and personally interpreted labs.  The pertinent results include:  cbc with hgb 11.7 (chronic), cmp nl, lactic elevated at 2.2   Imaging Studies ordered:  I  reviewed MRI.  I have asked the radiology department to have the images pushed to our PACS.  A copy of the report is in media, but shows probable osteomyelitis at the base  of the first digit proximal phalanx. I agree with the radiologist interpretation   Cardiac Monitoring:  The patient was maintained on a cardiac monitor.  I personally viewed and interpreted the cardiac monitored which showed an underlying rhythm of: nsr   Medicines ordered and prescription drug management:   I have reviewed the patients home medicines and have made adjustments as needed   Test Considered:  abi   Critical Interventions:  Podiatry consult/vascular consult   Consultations Obtained:  I requested consultation with the podiatrist (Dr. Lilian Kapur),  and discussed lab and imaging findings as well as pertinent plan - he recommends admission for probable OR tomorrow.  NPO after midnight.  No abx unless septic.  He also asks that I consult vascular. Pt d/w Dr. Randie Heinz (vascular) who will see pt in consult Pt d/w Dr. Jomarie Longs (triad) for admission  Problem List / ED Course:  Osteomyelitis foot:  admission for probable OR tomorrow   Reevaluation:  After the interventions noted above, I reevaluated the patient and found that they have :stayed the same   Social Determinants of Health:  Lives with daughter   Dispostion:  After consideration of the diagnostic results and the patients response to treatment, I feel that the patent would benefit from admission.          Final Clinical Impression(s) / ED Diagnoses Final diagnoses:  Osteomyelitis of right foot, unspecified type [facility])    Rx / DC Orders ED Discharge Orders     None         [name], MD 07/06/22 [phone]

## 2022-07-06 NOTE — ED Notes (Incomplete)
ED TO INPATIENT HANDOFF REPORT  ED Nurse Name and Phone #: ***  S Name/Age/Gender Alan Meyers 87 y.o. male Room/Bed: 015C/015C  Code Status   Code Status: Full Code  Home/SNF/Other {Discharge Destination:18313::"Home"} {Patient oriented to:22149:::1} Is this baseline? {YES/NO:21197}  Triage Complete: Triage complete  Chief Complaint Osteomyelitis [facility]) [M86.9]  Triage Note Pt arrives via POV with Daughter. Pt has had a wound on the bottom of his right foot for the past two to three weeks. He had an MRI yesterday which was concerning for osteomyelitis.    Allergies No Known Allergies  Level of Care/Admitting Diagnosis ED Disposition     ED Disposition  Admit   Condition  --   Comment  Hospital Area: [facility] [[id number]]  Level of Care: Med-Surg [16]  May admit patient to [facility] or [facility] if equivalent level of care is available:: No  Covid Evaluation: Asymptomatic - no recent exposure (last 10 days) testing not required  Diagnosis: Osteomyelitis [facility]) [[id number]]  Admitting Physician: Zannie Cove [[id number]]  Attending Physician: Zannie Cove [[id number]]  Certification:: I certify this patient will need inpatient services for at least 2 midnights  Estimated Length of Stay: 4          B Medical/Surgery History Past Medical History:  Diagnosis Date  . AAA (abdominal aortic aneurysm) (HCC)    6.1 - 6.2cm  . Anemia   . Dementia (HCC)   . Diarrhea, functional 08/01/2012  . Headache(784.0)   . Hemorrhoids   . Hyperlipidemia   . Hypertension    no meds for sev years  . Memory loss   . Peripheral arterial disease (HCC)   . Peripheral neuropathy   . Plantar warts 05/29/2012  . Pulsatile abdominal mass 08/01/2012   Past Surgical History:  Procedure Laterality Date  . ABDOMINAL AORTAGRAM N/A 08/11/2012   Procedure: ABDOMINAL Ronny Flurry;  Surgeon: Chuck Hint, MD;  Location: [facility] CATH LAB;  Service: Cardiovascular;  Laterality:  N/A;  . ABDOMINAL AORTIC ENDOVASCULAR STENT GRAFT N/A 09/18/2012   Procedure: ABDOMINAL AORTIC ENDOVASCULAR STENT GRAFT;  Surgeon: Chuck Hint, MD;  Location: [facility] OR;  Service: Vascular;  Laterality: N/A;  Ultrasound guided  . CATARACT EXTRACTION W/ INTRAOCULAR LENS  IMPLANT, BILATERAL     Hx; of  . ENDARTERECTOMY Left 10/28/2012   Procedure: ENDARTERECTOMY CAROTID- LEFT;  Surgeon: Chuck Hint, MD;  Location: [facility] OR;  Service: Vascular;  Laterality: Left;  . EYE SURGERY Bilateral    cat  . HEMORRHOID SURGERY  1970's     A IV Location/Drains/Wounds Patient Lines/Drains/Airways Status     Active Line/Drains/Airways     Name Placement date Placement time Site Days   Peripheral IV 07/06/22 22 G Right Antecubital 07/06/22  1546  Antecubital  less than 1   Peripheral IV 07/06/22 22 G Anterior;Right Hand 07/06/22  1546  Hand  less than 1            Intake/Output Last 24 hours No intake or output data in the 24 hours ending 07/06/22 1819  Labs/Imaging Results for orders placed or performed during the hospital encounter of 07/06/22 (from the past 48 hour(s))  Comprehensive metabolic panel     Status: Abnormal   Collection Time: 07/06/22  3:11 PM  Result Value Ref Range   Sodium 138 135 - 145 mmol/L   Potassium 4.7 3.5 - 5.1 mmol/L   Chloride 105 98 - 111 mmol/L   CO2 25 22 - 32 mmol/L  Glucose, Bld 103 (H) 70 - 99 mg/dL    Comment: Glucose reference range applies only to samples taken after fasting for at least 8 hours.   BUN 15 8 - 23 mg/dL   Creatinine, Ser 1.61 0.61 - 1.24 mg/dL   Calcium 8.7 (L) 8.9 - 10.3 mg/dL   Total Protein 7.2 6.5 - 8.1 g/dL   Albumin 3.0 (L) 3.5 - 5.0 g/dL   AST 20 15 - 41 U/L   ALT 15 0 - 44 U/L   Alkaline Phosphatase 77 38 - 126 U/L   Total Bilirubin 0.8 0.3 - 1.2 mg/dL   GFR, Estimated >09 >60 mL/min    Comment: (NOTE) Calculated using the CKD-EPI Creatinine Equation (2021)    Anion gap 8 5 - 15    Comment: Performed at  [facility], [address] [place], [place] [address]  CBC with Differential     Status: Abnormal   Collection Time: 07/06/22  3:11 PM  Result Value Ref Range   WBC 9.8 4.0 - 10.5 K/uL   RBC 3.94 (L) 4.22 - 5.81 MIL/uL   Hemoglobin 11.7 (L) 13.0 - 17.0 g/dL   HCT 81.1 (L) 91.4 - 78.2 %   MCV 93.4 80.0 - 100.0 fL   MCH 29.7 26.0 - 34.0 pg   MCHC 31.8 30.0 - 36.0 g/dL   RDW 95.6 21.3 - 08.6 %   Platelets 369 150 - 400 K/uL   nRBC 0.0 0.0 - 0.2 %   Neutrophils Relative % 70 %   Neutro Abs 6.8 1.7 - 7.7 K/uL   Lymphocytes Relative 16 %   Lymphs Abs 1.6 0.7 - 4.0 K/uL   Monocytes Relative 9 %   Monocytes Absolute 0.9 0.1 - 1.0 K/uL   Eosinophils Relative 4 %   Eosinophils Absolute 0.4 0.0 - 0.5 K/uL   Basophils Relative 1 %   Basophils Absolute 0.1 0.0 - 0.1 K/uL   Immature Granulocytes 0 %   Abs Immature Granulocytes 0.04 0.00 - 0.07 K/uL    Comment: Performed at [facility], [address], [place], [place] [address]  Lactic acid     Status: Abnormal   Collection Time: 07/06/22  3:11 PM  Result Value Ref Range   Lactic Acid, Venous 2.2 (HH) 0.5 - 1.9 mmol/L    Comment: CRITICAL RESULT CALLED TO, READ BACK BY AND VERIFIED WITH T VALDEZ RN AT 1630 [phone] BY D LONG Performed at [facility], [address] [place], [place] [address]    No results found.  Pending Labs Unresulted Labs (From admission, onward)     Start     Ordered   07/07/22 0500  Lactic acid, plasma  Tomorrow morning,   R        07/06/22 1818   07/06/22 1819  Sedimentation rate  Once,   R        07/06/22 1818   07/06/22 1819  C-reactive protein  Once,   R        07/06/22 1818   07/06/22 1456  Blood Cultures x 2 sites  BLOOD CULTURE X 2,   STAT      07/06/22 1456   Signed and Held  Hemoglobin A1c  Tomorrow morning,   R        Signed and Held   Signed and Held  CBC  Tomorrow morning,   R        Signed and Held   Signed and  Held  Comprehensive metabolic panel  Tomorrow morning,   R         Signed and Held            Vitals/Pain Today's Vitals   07/06/22 1444 07/06/22 1545 07/06/22 1630 07/06/22 1745  BP:  (!) 171/73 (!) 150/77 (!) 145/76  Pulse:  77 79 80  Resp:  20 20 18   Temp:      TempSrc:      SpO2:  98% 96% 98%  Weight: 57.2 kg     Height: 5\' 9"  (1.753 m)     PainSc: 0-No pain       Isolation Precautions No active isolations  Medications Medications - No data to display  Mobility {Mobility:20148}     Focused Assessments {ED Handoff Assessments:22153}   R Recommendations: See Admitting Provider Note  Report given to:   Additional Notes: ***

## 2022-07-06 NOTE — ED Provider Triage Note (Signed)
Emergency Medicine Provider Triage Evaluation Note  Alan Meyers , a 87 y.o. male  was evaluated in triage.  Pt complains of he was sent to the emergency department for osteomyelitis of the right lower extremity.  He has a history of AAA, peripheral vascular disease.  Outpatient MRI as follows.  07/05/2022 6:27 PM EDT  1. Bone marrow edema of the first metatarsal head/neck and base of the proximal phalanx of the first digit concerning for osteomyelitis. 2. Deep skin wound about the medial aspect of the first metatarsal head with surrounding edema and phlegmonous changes. 3. Increased intramuscular signal of the plantar muscles, which may represent myositis or reactive changes. 4. Soft tissue edema about the dorsum of the foot without appreciable fluid collection or abscess.  Review of Systems  Positive: Osteomyelitis Negative: Fever  Physical Exam  BP (!) 159/90 (BP Location: Left Arm)   Pulse 79   Temp 97.7 F (36.5 C) (Oral)   Resp (!) 22   Ht 5\' 9"  (1.753 m)   Wt 57.2 kg   SpO2 95%   BMI 18.61 kg/m  Gen:   Awake, no distress   Resp:  Normal effort  MSK:   Moves extremities without difficulty  Other:  Right lower extremity in walking boot  Medical Decision Making  Medically screening exam initiated at 2:56 PM.  Appropriate orders placed.  Hasib Choate was informed that the remainder of the evaluation will be completed by another provider, this initial triage assessment does not replace that evaluation, and the importance of remaining in the ED until their evaluation is complete.  Workup initiated   Arthor Captain, PA-C 07/06/22 1506

## 2022-07-06 NOTE — Progress Notes (Signed)
Arrived to room from ED. Alert and verbal. Able to make needs known. Oriented to room and call bell system. VSS.

## 2022-07-07 ENCOUNTER — Encounter (HOSPITAL_COMMUNITY): Payer: Medicare Other

## 2022-07-07 ENCOUNTER — Inpatient Hospital Stay (HOSPITAL_COMMUNITY): Payer: Medicare Other | Admitting: Certified Registered Nurse Anesthetist

## 2022-07-07 ENCOUNTER — Encounter (HOSPITAL_COMMUNITY): Payer: Self-pay | Admitting: Internal Medicine

## 2022-07-07 ENCOUNTER — Other Ambulatory Visit: Payer: Self-pay

## 2022-07-07 ENCOUNTER — Encounter (HOSPITAL_COMMUNITY)
Admission: EM | Disposition: A | Discharge status: Home Health | Payer: Self-pay | Source: Home / Self Care | Attending: Family Medicine

## 2022-07-07 DIAGNOSIS — Z87891 Personal history of nicotine dependence: Secondary | ICD-10-CM

## 2022-07-07 DIAGNOSIS — M869 Osteomyelitis, unspecified: Secondary | ICD-10-CM | POA: Diagnosis not present

## 2022-07-07 DIAGNOSIS — I10 Essential (primary) hypertension: Secondary | ICD-10-CM

## 2022-07-07 DIAGNOSIS — I739 Peripheral vascular disease, unspecified: Secondary | ICD-10-CM | POA: Diagnosis not present

## 2022-07-07 DIAGNOSIS — D649 Anemia, unspecified: Secondary | ICD-10-CM

## 2022-07-07 HISTORY — PX: AMPUTATION: SHX166

## 2022-07-07 LAB — CBC
HCT: 29.5 % — ABNORMAL LOW (ref 39.0–52.0)
Hemoglobin: 9.6 g/dL — ABNORMAL LOW (ref 13.0–17.0)
MCH: 30 pg (ref 26.0–34.0)
MCHC: 32.5 g/dL (ref 30.0–36.0)
MCV: 92.2 fL (ref 80.0–100.0)
Platelets: 278 10*3/uL (ref 150–400)
RBC: 3.2 MIL/uL — ABNORMAL LOW (ref 4.22–5.81)
RDW: 13.6 % (ref 11.5–15.5)
WBC: 6.4 10*3/uL (ref 4.0–10.5)
nRBC: 0 % (ref 0.0–0.2)

## 2022-07-07 LAB — COMPREHENSIVE METABOLIC PANEL
ALT: 13 U/L (ref 0–44)
AST: 16 U/L (ref 15–41)
Albumin: 2.3 g/dL — ABNORMAL LOW (ref 3.5–5.0)
Alkaline Phosphatase: 58 U/L (ref 38–126)
Anion gap: 7 (ref 5–15)
BUN: 10 mg/dL (ref 8–23)
CO2: 23 mmol/L (ref 22–32)
Calcium: 8.1 mg/dL — ABNORMAL LOW (ref 8.9–10.3)
Chloride: 106 mmol/L (ref 98–111)
Creatinine, Ser: 1.04 mg/dL (ref 0.61–1.24)
GFR, Estimated: >60 mL/min (ref >60)
Glucose, Bld: 91 mg/dL (ref 70–99)
Potassium: 3.8 mmol/L (ref 3.5–5.1)
Sodium: 136 mmol/L (ref 135–145)
Total Bilirubin: 0.8 mg/dL (ref 0.3–1.2)
Total Protein: 5.6 g/dL — ABNORMAL LOW (ref 6.5–8.1)

## 2022-07-07 LAB — CULTURE, BLOOD (ROUTINE X 2): Special Requests: ADEQUATE

## 2022-07-07 LAB — HEMOGLOBIN A1C
Hgb A1c MFr Bld: 5.5 % (ref 4.8–5.6)
Mean Plasma Glucose: 111.15 mg/dL

## 2022-07-07 LAB — LACTIC ACID, PLASMA: Lactic Acid, Venous: 0.9 mmol/L (ref 0.5–1.9)

## 2022-07-07 SURGERY — AMPUTATION DIGIT
Anesthesia: Monitor Anesthesia Care | Site: Foot | Laterality: Right

## 2022-07-07 MED ORDER — PROPOFOL 10 MG/ML IV BOLUS
INTRAVENOUS | Status: AC
  Filled 2022-07-07: qty 20

## 2022-07-07 MED ORDER — HALOPERIDOL LACTATE 5 MG/ML IJ SOLN
1.0000 mg | Freq: Once | INTRAMUSCULAR | Status: AC | PRN
  Administered 2022-07-08: 1 mg via INTRAVENOUS
  Filled 2022-07-07: qty 1

## 2022-07-07 MED ORDER — FENTANYL CITRATE (PF) 100 MCG/2ML IJ SOLN: 25.0000 ug | INTRAMUSCULAR | Status: DC | PRN

## 2022-07-07 MED ORDER — SODIUM CHLORIDE 0.9 % IV SOLN
2.0000 g | Freq: Two times a day (BID) | INTRAVENOUS | Status: DC
  Administered 2022-07-07 – 2022-07-10 (×6): 2 g via INTRAVENOUS
  Filled 2022-07-07 (×6): qty 12.5

## 2022-07-07 MED ORDER — FENTANYL CITRATE (PF) 250 MCG/5ML IJ SOLN
INTRAMUSCULAR | Status: DC | PRN
  Administered 2022-07-07: 50 ug via INTRAVENOUS

## 2022-07-07 MED ORDER — VANCOMYCIN HCL 1000 MG IV SOLR
INTRAVENOUS | Status: AC
  Filled 2022-07-07: qty 20

## 2022-07-07 MED ORDER — VANCOMYCIN HCL 750 MG/150ML IV SOLN
750.0000 mg | INTRAVENOUS | Status: DC
  Administered 2022-07-08: 750 mg via INTRAVENOUS
  Filled 2022-07-07 (×3): qty 150

## 2022-07-07 MED ORDER — PROPOFOL 500 MG/50ML IV EMUL
INTRAVENOUS | Status: DC | PRN
  Administered 2022-07-07: 150 ug/kg/min via INTRAVENOUS

## 2022-07-07 MED ORDER — BUPIVACAINE HCL (PF) 0.25 % IJ SOLN
INTRAMUSCULAR | Status: AC
  Filled 2022-07-07: qty 30

## 2022-07-07 MED ORDER — ACETAMINOPHEN 10 MG/ML IV SOLN: 1000.0000 mg | Freq: Once | INTRAVENOUS | Status: DC | PRN

## 2022-07-07 MED ORDER — CHLORHEXIDINE GLUCONATE 0.12 % MT SOLN
OROMUCOSAL | Status: AC
  Administered 2022-07-07: 15 mL
  Filled 2022-07-07: qty 15

## 2022-07-07 MED ORDER — PHENYLEPHRINE 80 MCG/ML (10ML) SYRINGE FOR IV PUSH (FOR BLOOD PRESSURE SUPPORT)
PREFILLED_SYRINGE | INTRAVENOUS | Status: DC | PRN
  Administered 2022-07-07 (×4): 80 ug via INTRAVENOUS

## 2022-07-07 MED ORDER — AMISULPRIDE (ANTIEMETIC) 5 MG/2ML IV SOLN: 10.0000 mg | Freq: Once | INTRAVENOUS | Status: DC | PRN

## 2022-07-07 MED ORDER — 0.9 % SODIUM CHLORIDE (POUR BTL) OPTIME
TOPICAL | Status: DC | PRN
  Administered 2022-07-07: 2000 mL

## 2022-07-07 MED ORDER — VANCOMYCIN HCL 1250 MG/250ML IV SOLN
1250.0000 mg | Freq: Once | INTRAVENOUS | Status: AC
  Administered 2022-07-07: 1250 mg via INTRAVENOUS
  Filled 2022-07-07: qty 250

## 2022-07-07 MED ORDER — LACTATED RINGERS IV SOLN: INTRAVENOUS | Status: DC | PRN

## 2022-07-07 MED ORDER — ONDANSETRON HCL 4 MG/2ML IJ SOLN: 4.0000 mg | Freq: Once | INTRAMUSCULAR | Status: DC | PRN

## 2022-07-07 MED ORDER — ONDANSETRON HCL 4 MG/2ML IJ SOLN
INTRAMUSCULAR | Status: DC | PRN
  Administered 2022-07-07: 4 mg via INTRAVENOUS

## 2022-07-07 MED ORDER — VANCOMYCIN HCL 1000 MG IV SOLR
INTRAVENOUS | Status: DC | PRN
  Administered 2022-07-07: 1000 mg via TOPICAL

## 2022-07-07 MED ORDER — METRONIDAZOLE 500 MG/100ML IV SOLN
500.0000 mg | Freq: Two times a day (BID) | INTRAVENOUS | Status: DC
  Administered 2022-07-07 – 2022-07-10 (×6): 500 mg via INTRAVENOUS
  Filled 2022-07-07 (×7): qty 100

## 2022-07-07 MED ORDER — FENTANYL CITRATE (PF) 250 MCG/5ML IJ SOLN
INTRAMUSCULAR | Status: AC
  Filled 2022-07-07: qty 5

## 2022-07-07 MED ORDER — BUPIVACAINE HCL (PF) 0.25 % IJ SOLN
INTRAMUSCULAR | Status: DC | PRN
  Administered 2022-07-07: 20 mL

## 2022-07-07 MED ORDER — CEFAZOLIN SODIUM-DEXTROSE 2-3 GM-%(50ML) IV SOLR
INTRAVENOUS | Status: DC | PRN
  Administered 2022-07-07: 2 g via INTRAVENOUS

## 2022-07-07 SURGICAL SUPPLY — 33 items
BLADE LONG MED 31X9 (MISCELLANEOUS) IMPLANT
BLADE SURG 10 STRL SS (BLADE) IMPLANT
BNDG GAUZE DERMACEA FLUFF 4 (GAUZE/BANDAGES/DRESSINGS) IMPLANT
COVER SURGICAL LIGHT HANDLE (MISCELLANEOUS) ×1 IMPLANT
GAUZE SPONGE 4X4 12PLY STRL (GAUZE/BANDAGES/DRESSINGS) IMPLANT
GAUZE XEROFORM 1X8 LF (GAUZE/BANDAGES/DRESSINGS) IMPLANT
GAUZE XEROFORM 5X9 LF (GAUZE/BANDAGES/DRESSINGS) IMPLANT
GLOVE PI ORTHO PRO STRL 7.5 (GLOVE) ×1 IMPLANT
GOWN STRL REUS W/ TWL LRG LVL3 (GOWN DISPOSABLE) ×2 IMPLANT
GOWN STRL REUS W/TWL LRG LVL3 (GOWN DISPOSABLE) ×2
HANDPIECE INTERPULSE COAX TIP (DISPOSABLE) ×1
KIT BASIN OR (CUSTOM PROCEDURE TRAY) ×1 IMPLANT
KIT TURNOVER KIT B (KITS) ×1 IMPLANT
NDL HYPO 25GX1X1/2 BEV (NEEDLE) IMPLANT
NEEDLE HYPO 25GX1X1/2 BEV (NEEDLE) ×1 IMPLANT
NS IRRIG 1000ML POUR BTL (IV SOLUTION) ×1 IMPLANT
PACK ORTHO EXTREMITY (CUSTOM PROCEDURE TRAY) ×1 IMPLANT
PAD ABD 8X10 STRL (GAUZE/BANDAGES/DRESSINGS) IMPLANT
SET HNDPC FAN SPRY TIP SCT (DISPOSABLE) IMPLANT
SOL PREP POV-IOD 4OZ 10% (MISCELLANEOUS) ×3 IMPLANT
SPECIMEN JAR SMALL (MISCELLANEOUS) ×1 IMPLANT
STAPLER VISISTAT 35W (STAPLE) IMPLANT
SUCTION FRAZIER HANDLE 12FR (TUBING) ×1
SUCTION TUBE FRAZIER 12FR DISP (TUBING) IMPLANT
SUT ETHILON 3 0 PS 1 (SUTURE) ×1 IMPLANT
SUT MNCRL AB 3-0 PS2 27 (SUTURE) IMPLANT
SWAB COLLECTION DEVICE MRSA (MISCELLANEOUS) IMPLANT
SWAB CULTURE ESWAB REG 1ML (MISCELLANEOUS) IMPLANT
SYR CONTROL 10ML LL (SYRINGE) IMPLANT
TOWEL GREEN STERILE (TOWEL DISPOSABLE) ×1 IMPLANT
TUBE CONNECTING 12X1/4 (SUCTIONS) IMPLANT
UNDERPAD 30X36 HEAVY ABSORB (UNDERPADS AND DIAPERS) ×1 IMPLANT
WATER STERILE IRR 1000ML POUR (IV SOLUTION) ×1 IMPLANT

## 2022-07-07 NOTE — H&P (Signed)
History and Physical Interval Note:  07/07/2022 10:11 AM  Alan Meyers  has presented today for surgery, with the diagnosis of osteomyelitis of right foot.  The various methods of treatment have been discussed with the patient and family. After consideration of risks, benefits and other options for treatment, the patient has consented to   Procedure(s): AMPUTATION , FOOT RAY (Right) as a surgical intervention.  The patient's history has been reviewed, patient examined, no change in status, stable for surgery.  I have reviewed the patient's chart and labs.  Questions were answered to the patient's satisfaction.     Edwin Cap

## 2022-07-07 NOTE — Brief Op Note (Signed)
07/07/2022  11:50 AM  PATIENT:  Alan Meyers  87 y.o. male  PRE-OPERATIVE DIAGNOSIS:  RIGHT FOOT OSTEOMYELITIS  POST-OPERATIVE DIAGNOSIS:  RIGHT FOOT OSTEOMYELITIS  PROCEDURE:  Procedure(s): AMPUTATION , FOOT RAY (Right)  SURGEON:  Surgeon(s) and Role:    * Amairani Shuey, Rachelle Hora, DPM - Primary   ASSISTANTS: none   ANESTHESIA:   MAC  EBL:  25 mL   BLOOD ADMINISTERED:none  DRAINS: none   LOCAL MEDICATIONS USED:  MARCAINE    and Amount: 20 ml  SPECIMEN:  first metatarsal head and toe, deep wound culture  DISPOSITION OF SPECIMEN:  PATHOLOGY, MICRO  COUNTS:  YES  TOURNIQUET:  * No tourniquets in log *  DICTATION: .Note written in EPIC  PLAN OF CARE: Admit to inpatient   PATIENT DISPOSITION:  PACU - hemodynamically stable.   Delay start of Pharmacological VTE agent (>24hrs) due to surgical blood loss or risk of bleeding: no  -Expect surgical cure of osteo -Can WB to heel for transfers. Minimize long amounts of ambulation -Bleeding and perfusion present but sub optimal. Will d/w vascular regarding angiography -Will change dressing tomorrow

## 2022-07-07 NOTE — Anesthesia Preprocedure Evaluation (Addendum)
Anesthesia Evaluation  Patient identified by MRN, date of birth, ID bandGeneral Assessment Comment:Responds to questions  Reviewed: Allergy & Precautions, NPO status , Patient's Chart, lab work & pertinent test results  Airway Mallampati: II  TM Distance: >3 FB Neck ROM: Full    Dental  (+) Edentulous Upper, Edentulous Lower   Pulmonary former smoker   Pulmonary exam normal        Cardiovascular hypertension, + Peripheral Vascular Disease  Normal cardiovascular exam  AAA (abdominal aortic aneurysm)   Neuro/Psych  Headaches PSYCHIATRIC DISORDERS     Dementia  Neuromuscular disease    GI/Hepatic negative GI ROS, Neg liver ROS,,,  Endo/Other  negative endocrine ROS    Renal/GU negative Renal ROS     Musculoskeletal negative musculoskeletal ROS (+)    Abdominal   Peds  Hematology  (+) Blood dyscrasia, anemia   Anesthesia Other Findings RIGHT FOOT OSTEOMYELITIS  Reproductive/Obstetrics                             Anesthesia Physical Anesthesia Plan  ASA: 3  Anesthesia Plan: MAC   Post-op Pain Management:    Induction: Intravenous  PONV Risk Score and Plan: 1 and Ondansetron, Dexamethasone, Propofol infusion and Treatment may vary due to age or medical condition  Airway Management Planned: Simple Face Mask  Additional Equipment:   Intra-op Plan:   Post-operative Plan:   Informed Consent: I have reviewed the patients History and Physical, chart, labs and discussed the procedure including the risks, benefits and alternatives for the proposed anesthesia with the patient or authorized representative who has indicated his/her understanding and acceptance.     Consent reviewed with POA  Plan Discussed with: CRNA  Anesthesia Plan Comments:         Anesthesia Quick Evaluation

## 2022-07-07 NOTE — Anesthesia Postprocedure Evaluation (Signed)
Anesthesia Post Note  Patient: Alan Meyers  Procedure(s) Performed: AMPUTATION , FOOT RAY (Right: Foot)     Patient location during evaluation: PACU Anesthesia Type: MAC Level of consciousness: awake and alert Pain management: pain level controlled Vital Signs Assessment: post-procedure vital signs reviewed and stable Respiratory status: spontaneous breathing, nonlabored ventilation, respiratory function stable and patient connected to nasal cannula oxygen Cardiovascular status: stable and blood pressure returned to baseline Postop Assessment: no apparent nausea or vomiting Anesthetic complications: no   No notable events documented.  Last Vitals:  Vitals:   07/07/22 1230 07/07/22 1255  BP: 129/67 139/64  Pulse: (!) 55 (!) 55  Resp: 16 20  Temp: 36.5 C 36.4 C  SpO2: 100% 99%    Last Pain:  Vitals:   07/07/22 1255  TempSrc: Oral  PainSc:                  [name]

## 2022-07-07 NOTE — Progress Notes (Addendum)
Pharmacy Antibiotic Note  Alan Meyers is a 87 y.o. male admitted on 07/06/2022 with  Osteomyelitis, right foot . Patient is followed by podiatry and he has had an ongoing right foot ulcer plantar first metatarsal for the last few weeks. He was recently admitted at [facility] (4/23 to 4/24) for cellulitis and was treated with cefazolin, then discharged on cephalexin. Patient was seen by podiatry on 5/8, wound was debrided, started on doxycycline, and MRI ordered. MRI results on 5/9 were concerning for osteomyelitis and patient was advised to return to ED for admission and surgery. Patient was started on Metronidazole per MD, and Pharmacy has been consulted for Vancomycin and Cefepime dosing.  Plan: Vancomycin 1250 mg IV x 1, followed by 750 mg q24h (eAUC 456.4, goal AUC 400-550, SCr used 1.04) Cefepime 2 g IV q12h Metronidazole 500 mg IV q12h Monitor clinical course, renal function Follow-up cultures, LOT, de-escalate as able Obtain Vancomycin levels as clinically indicated  Height: 5\' 9"  (175.3 cm) Weight: 57.2 kg (126 lb) IBW/kg (Calculated) : 70.7  Temp (24hrs), Avg:98.1 F (36.7 C), Min:97.6 F (36.4 C), Max:98.8 F (37.1 C)  Recent Labs  Lab 07/06/22 1511 07/07/22 0034  WBC 9.8 6.4  CREATININE 1.11 1.04  LATICACIDVEN 2.2* 0.9    Estimated Creatinine Clearance: 42.8 mL/min (by C-G formula based on SCr of 1.04 mg/dL).    No Known Allergies  Antimicrobials this admission: Vancomycin 5/11 >>  Cefepime 5/11 >>  Metronidazole 5/11 >>   Microbiology results: 5/10 BCx: in process 5/10 MRSA PCR: negative   Thank you for allowing pharmacy to be a part of this patient's care.  Jerrilyn Cairo, PharmD PGY-2 Pharmacy Resident Phone [phone] 07/07/2022 12:50 PM   Please check AMION for all [facility] Pharmacy phone numbers After 10:00 PM, call Main Pharmacy [phone]

## 2022-07-07 NOTE — Progress Notes (Signed)
Orthopedic Tech Progress Note Patient Details:  Alan Meyers 1935/06/05 [id number]  Ortho Devices Type of Ortho Device: Postop shoe/boot Ortho Device/Splint Location: Fit to R foot, at bedside for use when OOB Ortho Device/Splint Interventions: Ordered, Application, Adjustment, Removal   Post Interventions Patient Tolerated: Well Instructions Provided: Care of device, Adjustment of device  [name] [name] 07/07/2022, 2:49 PM

## 2022-07-07 NOTE — Transfer of Care (Signed)
Immediate Anesthesia Transfer of Care Note  Patient: Alan Meyers  Procedure(s) Performed: AMPUTATION , FOOT RAY (Right: Foot)  Patient Location: PACU  Anesthesia Type:MAC  Level of Consciousness: drowsy and patient cooperative  Airway & Oxygen Therapy: Patient Spontanous Breathing  Post-op Assessment: Report given to RN and Post -op Vital signs reviewed and stable  Post vital signs: Reviewed and stable  Last Vitals:  Vitals Value Taken Time  BP 128/57 07/07/22 1200  Temp    Pulse 54 07/07/22 1201  Resp 11 07/07/22 1201  SpO2 100 % 07/07/22 1201  Vitals shown include unvalidated device data.  Last Pain:  Vitals:   07/07/22 1005  TempSrc:   PainSc: 0-No pain      Patients Stated Pain Goal: 0 (07/06/22 1858)  Complications: No notable events documented.

## 2022-07-07 NOTE — Progress Notes (Signed)
Pt confused and restless. Keeps transferring self out of bed and wants to leave. Previous nurse reports he keeps removing IV lines. Not able to redirect due to confusion and agitation. Provider informed and sitter requested. Sitter at bedside at this time.

## 2022-07-07 NOTE — Plan of Care (Signed)

## 2022-07-07 NOTE — Progress Notes (Signed)
  Progress Note    07/07/2022 8:05 AM Day of Surgery  Subjective: He does not have any complaints this morning.    He is alone in his room at the time my examination   Vitals:   07/06/22 2035 07/06/22 2339  BP: 112/65 (!) 120/59  Pulse: 67 65  Resp: 18 16  Temp: 98.8 F (37.1 C) 98.5 F (36.9 C)  SpO2:  95%    Physical Exam: Awake and alert Nonlabored respirations Bilateral popliteal pulses are palpable No palpable tibial pulses Edema in right foot is much improved today without any draining purulence from the foot  CBC    Component Value Date/Time   WBC 6.4 07/07/2022 0034   RBC 3.20 (L) 07/07/2022 0034   HGB 9.6 (L) 07/07/2022 0034   HCT 29.5 (L) 07/07/2022 0034   PLT 278 07/07/2022 0034   MCV 92.2 07/07/2022 0034   MCH 30.0 07/07/2022 0034   MCHC 32.5 07/07/2022 0034   RDW 13.6 07/07/2022 0034   LYMPHSABS 1.6 07/06/2022 1511   MONOABS 0.9 07/06/2022 1511   EOSABS 0.4 07/06/2022 1511   BASOSABS 0.1 07/06/2022 1511    BMET    Component Value Date/Time   NA 136 07/07/2022 0034   K 3.8 07/07/2022 0034   CL 106 07/07/2022 0034   CO2 23 07/07/2022 0034   GLUCOSE 91 07/07/2022 0034   BUN 10 07/07/2022 0034   CREATININE 1.04 07/07/2022 0034   CREATININE 1.00 08/18/2012 1330   CALCIUM 8.1 (L) 07/07/2022 0034   GFRNONAA >60 07/07/2022 0034   GFRAA >90 10/29/2012 0415    INR    Component Value Date/Time   INR 0.92 10/22/2012 1322     Intake/Output Summary (Last 24 hours) at 07/07/2022 0805 Last data filed at 07/07/2022 0308 Gross per 24 hour  Intake 594.54 ml  Output 300 ml  Net 294.54 ml     Assessment:  87 y.o. male is here with right foot infection with known arterial disease previous carotid endarterectomy and repair of aortic aneurysm.  Plan: OR today with podiatry  Follow-up arterial duplex and ABI   Kazzandra Desaulniers C. Randie Heinz, MD [facility] Office: [phone] Pager: [phone]  07/07/2022 8:05 AM

## 2022-07-07 NOTE — Progress Notes (Signed)
PROGRESS NOTE   Alan Meyers  ZOX:[id number] DOB: 12-01-1935 DOA: 07/06/2022 PCP: Donetta Potts, MD  Brief Narrative:  87 year old white male home dwelling Character to artery stenosis status post L CEA 10/2012 + CVA HTN, HLD Prior smoker, H. pylori on endoscopy 10/2021 Dementia followed by [facility] neurology MoCA testing 09/3008/2023 started on Aricept previously now on Namenda Recently following with [facility] podiatry since 06/2022 status post several debridements and several rounds of doxycycline/Keflex with concerns for osteomyelitis MRI performed showing osteo and admitted to the hospital Podiatrist consulted Vascular surgery consulted  5/11 ray amputation right foot Dr. Lilian Kapur  Hospital-Problem based course  Osteomyelitis right foot status post ray amputation Continues Vanco cefepime Flagyl and narrow according to bone biopsies anticipate needing 2 weeks from time of clear margins will discuss with ID Continue saline 75 cc/H Pain control Norco 1-2 and can augment as needed Follow-up blood culture Vascular seeing and will follow studies ABI and others as per them  Stroke, L CEA 2014 prior smoker Continue aspirin 81  HTN HLD not on meds from prior to admission? Follow trends of blood pressure-do not treat  Severe dementia previously Previously on Aricept-not on anything at this time-will discuss implications with family  DVT prophylaxis: SCD Code Status: FULL Family Communication: Called [name] [phone] Disposition:  Status is: Inpatient Remains inpatient appropriate because:   Needs cult    Subjective: Awake some coherent after being seen on PACU  Objective: Vitals:   07/07/22 1200 07/07/22 1215 07/07/22 1230 07/07/22 1255  BP: (!) 128/57 (!) 110/59 129/67 139/64  Pulse: (!) 52 (!) 55 (!) 55 (!) 55  Resp: 12 19 16 20   Temp: 98 F (36.7 C)  97.7 F (36.5 C) 97.6 F (36.4 C)  TempSrc:    Oral  SpO2: 99% 100% 100% 99%  Weight:       Height:        Intake/Output Summary (Last 24 hours) at 07/07/2022 1356 Last data filed at 07/07/2022 1152 Gross per 24 hour  Intake 1294.54 ml  Output 325 ml  Net 969.54 ml   Filed Weights   07/06/22 1444  Weight: 57.2 kg    Examination:  Awakens easily No distress cta b no added sound Abdomen soft S1 s2 no m/r/g Rom intact  Data Reviewed: personally reviewed   CBC    Component Value Date/Time   WBC 6.4 07/07/2022 0034   RBC 3.20 (L) 07/07/2022 0034   HGB 9.6 (L) 07/07/2022 0034   HCT 29.5 (L) 07/07/2022 0034   PLT 278 07/07/2022 0034   MCV 92.2 07/07/2022 0034   MCH 30.0 07/07/2022 0034   MCHC 32.5 07/07/2022 0034   RDW 13.6 07/07/2022 0034   LYMPHSABS 1.6 07/06/2022 1511   MONOABS 0.9 07/06/2022 1511   EOSABS 0.4 07/06/2022 1511   BASOSABS 0.1 07/06/2022 1511      Latest Ref Rng & Units 07/07/2022   12:34 AM 07/06/2022    3:11 PM 06/20/2022    4:15 AM  CMP  Glucose 70 - 99 mg/dL 91  562  79   BUN 8 - 23 mg/dL 10  15  23    Creatinine 0.61 - 1.24 mg/dL 1.30  8.65  7.84   Sodium 135 - 145 mmol/L 136  138  136   Potassium 3.5 - 5.1 mmol/L 3.8  4.7  3.2   Chloride 98 - 111 mmol/L 106  105  104   CO2 22 - 32 mmol/L 23  25  24  Calcium 8.9 - 10.3 mg/dL 8.1  8.7  8.3   Total Protein 6.5 - 8.1 g/dL 5.6  7.2    Total Bilirubin 0.3 - 1.2 mg/dL 0.8  0.8    Alkaline Phos 38 - 126 U/L 58  77    AST 15 - 41 U/L 16  20    ALT 0 - 44 U/L 13  15       Radiology Studies: No results found.   Scheduled Meds:  aspirin  81 mg Oral Daily   Continuous Infusions:  sodium chloride 75 mL/hr at 07/07/22 0447   ceFEPime (MAXIPIME) IV 2 g (07/07/22 1314)   metronidazole 500 mg (07/07/22 1318)   vancomycin     [START ON 07/08/2022] vancomycin       LOS: 1 day   Time spent: 25  Rhetta Mura, MD [facility] To contact the attending provider between 7A-7P or the covering provider during after hours 7P-7A, please log into the web site [email or web address] and  access using universal [place] password for that web site. If you do not have the password, please call the hospital operator.  07/07/2022, 1:56 PM

## 2022-07-07 NOTE — Consult Note (Signed)
Reason for Consult: Osteomyelitis right foot Referring Physician: [name], MD  Alan Meyers is an 87 y.o. male.  HPI: Patient is known to our practice saw my partner Dr. Ralene Cork 2 days ago.  He has had an ongoing right foot ulcer plantar first metatarsal for the last few weeks.  Was admitted for cellulitis for this in April.  Was placed on doxycycline and cephalexin.  Dr. Ralene Cork refilled the doxycycline ordered an MRI and ABIs.  MRI was completed on 07/05/2022, showed osteomyelitis and was recommended for him to return to the emergency room for admission and surgery.  His daughter is present at bedside, the patient has some memory/dementia issues and she is his primary caregiver and POA.  Past Medical History:  Diagnosis Date   AAA (abdominal aortic aneurysm) (HCC)    6.1 - 6.2cm   Anemia    Dementia (HCC)    Diarrhea, functional 08/01/2012   Headache(784.0)    Hemorrhoids    Hyperlipidemia    Hypertension    no meds for sev years   Memory loss    Peripheral arterial disease (HCC)    Peripheral neuropathy    Plantar warts 05/29/2012   Pulsatile abdominal mass 08/01/2012    Past Surgical History:  Procedure Laterality Date   ABDOMINAL AORTAGRAM N/A 08/11/2012   Procedure: ABDOMINAL Ronny Flurry;  Surgeon: Chuck Hint, MD;  Location: [facility] CATH LAB;  Service: Cardiovascular;  Laterality: N/A;   ABDOMINAL AORTIC ENDOVASCULAR STENT GRAFT N/A 09/18/2012   Procedure: ABDOMINAL AORTIC ENDOVASCULAR STENT GRAFT;  Surgeon: Chuck Hint, MD;  Location: [facility] OR;  Service: Vascular;  Laterality: N/A;  Ultrasound guided   CATARACT EXTRACTION W/ INTRAOCULAR LENS  IMPLANT, BILATERAL     Hx; of   ENDARTERECTOMY Left 10/28/2012   Procedure: ENDARTERECTOMY CAROTID- LEFT;  Surgeon: Chuck Hint, MD;  Location: [facility] OR;  Service: Vascular;  Laterality: Left;   EYE SURGERY Bilateral    cat   HEMORRHOID SURGERY  1970's    Family History  Problem Relation Age of Onset    Tuberculosis Mother    Heart disease Father        Valvular heart disease and Congestive heart failure   Heart attack Father    Cancer Sister    COPD Brother    COPD Brother     Social History:  reports that he quit smoking about 19 years ago. His smoking use included cigarettes. He has a 60.00 pack-year smoking history. He has never used smokeless tobacco. He reports that he does not drink alcohol and does not use drugs.  Allergies: No Known Allergies  Medications: I have reviewed the patient's current medications.  Results for orders placed or performed during the hospital encounter of 07/06/22 (from the past 48 hour(s))  Comprehensive metabolic panel     Status: Abnormal   Collection Time: 07/06/22  3:11 PM  Result Value Ref Range   Sodium 138 135 - 145 mmol/L   Potassium 4.7 3.5 - 5.1 mmol/L   Chloride 105 98 - 111 mmol/L   CO2 25 22 - 32 mmol/L   Glucose, Bld 103 (H) 70 - 99 mg/dL    Comment: Glucose reference range applies only to samples taken after fasting for at least 8 hours.   BUN 15 8 - 23 mg/dL   Creatinine, Ser 1.61 0.61 - 1.24 mg/dL   Calcium 8.7 (L) 8.9 - 10.3 mg/dL   Total Protein 7.2 6.5 - 8.1 g/dL   Albumin 3.0 (L)  3.5 - 5.0 g/dL   AST 20 15 - 41 U/L   ALT 15 0 - 44 U/L   Alkaline Phosphatase 77 38 - 126 U/L   Total Bilirubin 0.8 0.3 - 1.2 mg/dL   GFR, Estimated >30 >86 mL/min    Comment: (NOTE) Calculated using the CKD-EPI Creatinine Equation (2021)    Anion gap 8 5 - 15    Comment: Performed at [facility], [address] [place], [place] [address]  CBC with Differential     Status: Abnormal   Collection Time: 07/06/22  3:11 PM  Result Value Ref Range   WBC 9.8 4.0 - 10.5 K/uL   RBC 3.94 (L) 4.22 - 5.81 MIL/uL   Hemoglobin 11.7 (L) 13.0 - 17.0 g/dL   HCT 96.2 (L) 95.2 - 84.1 %   MCV 93.4 80.0 - 100.0 fL   MCH 29.7 26.0 - 34.0 pg   MCHC 31.8 30.0 - 36.0 g/dL   RDW 32.4 40.1 - 02.7 %   Platelets 369 150 - 400 K/uL   nRBC 0.0 0.0 - 0.2 %    Neutrophils Relative % 70 %   Neutro Abs 6.8 1.7 - 7.7 K/uL   Lymphocytes Relative 16 %   Lymphs Abs 1.6 0.7 - 4.0 K/uL   Monocytes Relative 9 %   Monocytes Absolute 0.9 0.1 - 1.0 K/uL   Eosinophils Relative 4 %   Eosinophils Absolute 0.4 0.0 - 0.5 K/uL   Basophils Relative 1 %   Basophils Absolute 0.1 0.0 - 0.1 K/uL   Immature Granulocytes 0 %   Abs Immature Granulocytes 0.04 0.00 - 0.07 K/uL    Comment: Performed at [facility], [address] [place], [place] [address]  Lactic acid     Status: Abnormal   Collection Time: 07/06/22  3:11 PM  Result Value Ref Range   Lactic Acid, Venous 2.2 (HH) 0.5 - 1.9 mmol/L    Comment: CRITICAL RESULT CALLED TO, READ BACK BY AND VERIFIED WITH T VALDEZ RN AT 1630 [phone] BY D LONG Performed at [facility], [address] [place], [place] [address]   Sedimentation rate     Status: Abnormal   Collection Time: 07/06/22  7:38 PM  Result Value Ref Range   Sed Rate 117 (H) 0 - 16 mm/hr    Comment: Performed at [facility], [address], [place], [place] [address]  C-reactive protein     Status: Abnormal   Collection Time: 07/06/22  7:38 PM  Result Value Ref Range   CRP 2.7 (H) <1.0 mg/dL    Comment: Performed at [facility], [address], [place], [place] [address]  Surgical PCR screen     Status: None   Collection Time: 07/06/22 10:08 PM   Specimen: Nasal Mucosa; Nasal Swab  Result Value Ref Range   MRSA, PCR NEGATIVE NEGATIVE   Staphylococcus aureus NEGATIVE NEGATIVE    Comment: (NOTE) The Xpert SA Assay (FDA approved for NASAL specimens in patients 10 years of age and older), is one component of a comprehensive surveillance program. It is not intended to diagnose infection nor to guide or monitor treatment. Performed at [facility], [address], [place], [place] [address]   Lactic acid, plasma     Status: None   Collection Time: 07/07/22 12:34 AM  Result Value Ref Range   Lactic Acid,  Venous 0.9 0.5 - 1.9 mmol/L    Comment: Performed  at [facility], [address] [place], [place] [address]  Hemoglobin A1c     Status: None   Collection Time: 07/07/22 12:34 AM  Result Value Ref Range   Hgb A1c MFr Bld 5.5 4.8 - 5.6 %    Comment: (NOTE) Pre diabetes:          5.7%-6.4%  Diabetes:              >6.4%  Glycemic control for   <7.0% adults with diabetes    Mean Plasma Glucose 111.15 mg/dL    Comment: Performed at [facility], [address], [place], [place] [address]  CBC     Status: Abnormal   Collection Time: 07/07/22 12:34 AM  Result Value Ref Range   WBC 6.4 4.0 - 10.5 K/uL   RBC 3.20 (L) 4.22 - 5.81 MIL/uL   Hemoglobin 9.6 (L) 13.0 - 17.0 g/dL   HCT 09.8 (L) 11.9 - 14.7 %   MCV 92.2 80.0 - 100.0 fL   MCH 30.0 26.0 - 34.0 pg   MCHC 32.5 30.0 - 36.0 g/dL   RDW 82.9 56.2 - 13.0 %   Platelets 278 150 - 400 K/uL   nRBC 0.0 0.0 - 0.2 %    Comment: Performed at [facility], [address] [place], [place] [address]  Comprehensive metabolic panel     Status: Abnormal   Collection Time: 07/07/22 12:34 AM  Result Value Ref Range   Sodium 136 135 - 145 mmol/L   Potassium 3.8 3.5 - 5.1 mmol/L   Chloride 106 98 - 111 mmol/L   CO2 23 22 - 32 mmol/L   Glucose, Bld 91 70 - 99 mg/dL    Comment: Glucose reference range applies only to samples taken after fasting for at least 8 hours.   BUN 10 8 - 23 mg/dL   Creatinine, Ser 4.69 0.61 - 1.24 mg/dL   Calcium 8.1 (L) 8.9 - 10.3 mg/dL   Total Protein 5.6 (L) 6.5 - 8.1 g/dL   Albumin 2.3 (L) 3.5 - 5.0 g/dL   AST 16 15 - 41 U/L   ALT 13 0 - 44 U/L   Alkaline Phosphatase 58 38 - 126 U/L   Total Bilirubin 0.8 0.3 - 1.2 mg/dL   GFR, Estimated >62 >95 mL/min    Comment: (NOTE) Calculated using the CKD-EPI Creatinine Equation (2021)    Anion gap 7 5 - 15    Comment: Performed at [facility], [address], [place], [place] [address]    No results found.  Review of Systems  Reason unable  to perform ROS: Most of the history provided by daughter, patient smiles and mostly gives yes no answers but is inconsistent.  Constitutional:  Negative for chills, fever and malaise/fatigue.  Respiratory:  Negative for shortness of breath.   Cardiovascular:  Negative for chest pain.  Gastrointestinal:  Negative for diarrhea, nausea and vomiting.  Skin:        Right foot ulcer and redness with drainage   Blood pressure 126/62, pulse 61, temperature 97.6 F (36.4 C), temperature source Oral, resp. rate 18, height 5\' 9"  (1.753 m), weight 57.2 kg, SpO2 100 %.  Vitals:   07/06/22 2339 07/07/22 0937  BP: (!) 120/59 126/62  Pulse: 65 61  Resp: 16 18  Temp: 98.5 F (36.9 C) 97.6 F (36.4 C)  SpO2: 95% 100%    General AA&O x3. Normal mood and affect.  Vascular Dorsalis  pedis and posterior tibial pulses  absent right  Capillary refill normal to all digits. Pedal hair growth diminished.  Neurologic Epicritic sensation grossly  reduced .  Dermatologic (Wound) Necrotic ulcer with seropurulent drainage on plantar first metatarsal phalangeal joint, erythema surrounding this  Orthopedic: Motor intact BLE.    Assessment/Plan:  Osteomyelitis right foot -Imaging: Studies independently reviewed. Images viewed and I agree with the report of osteomyelitis of the 1st metatarsal head and proximal phalanx -Antibiotics: have been held in advance of OR, has been on doxy/cephalexin recently. Will start on vancomycin and zosyn post operatively and tailor per culture data -WB Status: WB to heel on R in surgical shoe -Wound Care: pending surgical outcome -Surgical Plan: to OR today for partial first ray resection. All risks, benefits and potential complications discussed prior to the procedure. All questions addressed. Informed consent signed and reviewed. This was discussed and completed with his daughter.      Edwin Cap 07/07/2022, 10:19 AM   Best available via secure chat for questions or  concerns.

## 2022-07-07 NOTE — Op Note (Signed)
Patient Name: Keyjuan Tiano DOB: Sep 10, 1935  MRN: [id number]   Date of Service: 07/06/2022 - 07/07/2022  Surgeon: Dr. Sharl Ma, DPM Assistants: None Pre-operative Diagnosis:  RIGHT FOOT OSTEOMYELITIS Post-operative Diagnosis:  RIGHT FOOT OSTEOMYELITIS Procedures: Procedures:   * AMPUTATION , FOOT RAY Pathology/Specimens: ID Type Source Tests Collected by Time Destination  1 : Right Foot First Ray Amputation Tissue PATH Digit amputation SURGICAL PATHOLOGY Edwin Cap, DPM 07/07/2022 1122   A : Deep Wound Culture Right Foot Wound Wound AEROBIC/ANAEROBIC CULTURE W GRAM STAIN (SURGICAL/DEEP WOUND) Edwin Cap, DPM 07/07/2022 1116   B : 1st Metatarsal Culture Tissue Soft Tissue, Other AEROBIC/ANAEROBIC CULTURE W Romie Minus STAIN (SURGICAL/DEEP WOUND) Edwin Cap, DPM 07/07/2022 1118    Anesthesia: MAC Hemostasis: * No tourniquets in log * Estimated Blood Loss: 25 mL Materials: * No implants in log * Medications: 20cc Marcaine Complications: none  Indications for Procedure:  This is a 87 y.o. male with a history of PAD and polyneuropathy and submetatarsal 1 ulceration resulting in osteomyelitis confirmed on MRI prior to surgery.  Partial amputation was recommended. All risks, benefits and potential complications discussed prior to the procedure. All questions addressed. Informed consent signed and reviewed.      Procedure in Detail: Patient was identified in pre-operative holding area. Formal consent was signed and the right lower extremity was marked. Patient was brought back to the operating room. Anesthesia was induced. The extremity was prepped and draped in the usual sterile fashion. Timeout was taken to confirm patient name, laterality, and procedure prior to incision.   Attention was then directed to the right foot which exhibited an ulceration on the plantar first MTPJ with exposed tendon and joint.  Purulent drainage emanated from this.  The overlying eschar was excised  and deep culture of the underlying tissue was taken.  I then directed my attention dorsal and a curvilinear racquet shaped incision was created around the first metatarsal.  This was carried down to the level of bone and the soft tissues were elevated from the metatarsal shaft.  A sagittal saw was used to resect the first metatarsal head and a standard transmetatarsal amputation orientation.  The first metatarsal head, sesamoids and hallux were then amputated from the foot.  In the plantar portions deep to the sesamoid complex there was necrotic soft tissue.  This was excised and a skin wedge encompassing the plantar ulceration was also excised.  Culture of the metatarsal head was sent for microbiology as well.  The remaining bone and soft tissue was sent for pathology. Debridement was completed and the wound was then thoroughly irrigated.  Hemostasis was achieved.  1 g of vancomycin powder was then instilled into the wound bed.  It was closed in layers with 3-0 Monocryl 3-0 nylon and skin staples.  The foot was then dressed with Xeroform and dry sterile dressings. Patient tolerated the procedure well.   Disposition: Following a period of post-operative monitoring, patient will be transferred to the floor.

## 2022-07-08 ENCOUNTER — Inpatient Hospital Stay (HOSPITAL_COMMUNITY): Payer: Medicare Other

## 2022-07-08 ENCOUNTER — Encounter (HOSPITAL_COMMUNITY): Payer: Self-pay | Admitting: Podiatry

## 2022-07-08 DIAGNOSIS — M869 Osteomyelitis, unspecified: Secondary | ICD-10-CM | POA: Diagnosis not present

## 2022-07-08 DIAGNOSIS — I739 Peripheral vascular disease, unspecified: Secondary | ICD-10-CM

## 2022-07-08 DIAGNOSIS — I7090 Unspecified atherosclerosis: Secondary | ICD-10-CM | POA: Diagnosis not present

## 2022-07-08 LAB — AEROBIC/ANAEROBIC CULTURE W GRAM STAIN (SURGICAL/DEEP WOUND)

## 2022-07-08 LAB — CULTURE, BLOOD (ROUTINE X 2): Culture: NO GROWTH

## 2022-07-08 LAB — VAS US ABI WITH/WO TBI: Right ABI: 0.63

## 2022-07-08 MED ORDER — SODIUM CHLORIDE 0.9 % IV SOLN: INTRAVENOUS | Status: DC

## 2022-07-08 NOTE — Progress Notes (Signed)
  Subjective:  Patient ID: Alan Meyers, male    DOB: 23-Feb-1936,  MRN: [id number]  POD#1 right partial first ray amputation  Negative for chest pain and shortness of breath Review of all other systems is negative Objective:   Vitals:   07/08/22 0759 07/08/22 1631  BP: (!) 144/77 (!) 152/68  Pulse: 93 83  Resp: 17 18  Temp: 98.1 F (36.7 C) (!) 97.5 F (36.4 C)  SpO2: 96% 100%   General AA&O x3. Normal mood and affect.   Vascular Non palpable pulses. Foot is warm  Neurologic Epicritic sensation grossly reduced  Dermatologic Incision well coapted. Skin bridges perfusing. No signs of infection  Orthopedic: MMT 5/5 in dorsiflexion, plantarflexion, inversion, and eversion. Normal joint ROM without pain or crepitus.    Assessment & Plan:  Patient was evaluated and treated and all questions answered.  POD#1 partial ray amputation right first -So far no skin necrosis -Going to angio tomorrow -Dressing changed today. Will change again tomorrow vs Tues pending availability then can remain intact -F/u w/ me next week. Office will schedule -WBAT with weight to heel in post op shoe  Edwin Cap, DPM  Accessible via secure chat for questions or concerns.

## 2022-07-08 NOTE — Progress Notes (Addendum)
  Progress Note    07/08/2022 10:50 AM 1 Day Post-Op  Subjective: No complaints this morning  Vitals:   07/08/22 0355 07/08/22 0759  BP: (!) 144/76 (!) 144/77  Pulse: 86 93  Resp: 17 17  Temp: 97.8 F (36.6 C) 98.1 F (36.7 C)  SpO2: 96% 96%    Physical Exam: Awake and alert although somewhat confused Bilateral common femoral pulses are palpable Dressing in place on right foot  CBC    Component Value Date/Time   WBC 6.4 07/07/2022 0034   RBC 3.20 (L) 07/07/2022 0034   HGB 9.6 (L) 07/07/2022 0034   HCT 29.5 (L) 07/07/2022 0034   PLT 278 07/07/2022 0034   MCV 92.2 07/07/2022 0034   MCH 30.0 07/07/2022 0034   MCHC 32.5 07/07/2022 0034   RDW 13.6 07/07/2022 0034   LYMPHSABS 1.6 07/06/2022 1511   MONOABS 0.9 07/06/2022 1511   EOSABS 0.4 07/06/2022 1511   BASOSABS 0.1 07/06/2022 1511    BMET    Component Value Date/Time   NA 136 07/07/2022 0034   K 3.8 07/07/2022 0034   CL 106 07/07/2022 0034   CO2 23 07/07/2022 0034   GLUCOSE 91 07/07/2022 0034   BUN 10 07/07/2022 0034   CREATININE 1.04 07/07/2022 0034   CREATININE 1.00 08/18/2012 1330   CALCIUM 8.1 (L) 07/07/2022 0034   GFRNONAA >60 07/07/2022 0034   GFRAA >90 10/29/2012 0415    INR    Component Value Date/Time   INR 0.92 10/22/2012 1322     Intake/Output Summary (Last 24 hours) at 07/08/2022 1050 Last data filed at 07/08/2022 0301 Gross per 24 hour  Intake 1302.72 ml  Output 625 ml  Net 677.72 ml     Assessment:  87 y.o. male is s/p transmetatarsal amputation right first toe with evidence of chronic right lower extremity limb threatening ischemia  Plan: Cath lab tomorrow for aortogram with RLE angiogram and possible intervention to assist with healing  I discussed the plan with both the son and daughter via telephone.  He will be n.p.o. after breakfast tomorrow.   Ester Hilley C. Randie Heinz, MD [facility] Office: [phone] Pager:  [phone]  07/08/2022 10:50 AM

## 2022-07-08 NOTE — Progress Notes (Signed)
ABI and RLE arterial duplex have been completed.    Results can be found under chart review under CV PROC. 07/08/2022 5:50 PM Alan Meyers RVT, RDMS

## 2022-07-08 NOTE — Evaluation (Signed)
Physical Therapy Evaluation Patient Details Name: Alan Meyers MRN: [id number] DOB: Aug 12, 1935 Today's Date: 07/08/2022  History of Present Illness  Admitted with R foot first metatarsal ulcer, plantar surface of foot; s/p OR for partial first ray resection, WB through heel only in postop shoe; Plan for aortogram and possible intervention on 5/13;   has a past medical history of AAA (abdominal aortic aneurysm) (HCC), Anemia, Dementia (HCC), Diarrhea, functional (08/01/2012), Headache(784.0), Hemorrhoids, Hyperlipidemia, Hypertension, Memory loss, Peripheral arterial disease (HCC), Peripheral neuropathy, Plantar warts (05/29/2012), and Pulsatile abdominal mass (08/01/2012).  Clinical Impression   Pt admitted with above diagnosis. Lives at home alone, in a single-level home with 4-5 steps to enter; Prior to admission, pt was able to manage independently (was able to walk the hallways without assist during recent admission in April); Presents to PT with R foot soreness with weight bearing and when in the dependent position, difficulty keeping weight solely on his R heel in stance, seemingly less familiar with using RW for amb;  Also with decr cognition compared to PT assessment in April; Overall needing min assist and cues for safety to stand up to RW, and to take steps including pivot steps with RW; At this point, we must consider post- acute rehab to maximize independence and safety with mobility and ADLs -- especially given confusion; Will need more information re; any available family assist; Pt currently with functional limitations due to the deficits listed below (see PT Problem List). Pt will benefit from skilled PT to increase their independence and safety with mobility to allow discharge to the venue listed below.          Recommendations for follow up therapy are one component of a multi-disciplinary discharge planning process, led by the attending physician.  Recommendations may be updated based  on patient status, additional functional criteria and insurance authorization.  Follow Up Recommendations       Assistance Recommended at Discharge Frequent or constant Supervision/Assistance  Patient can return home with the following  A little help with walking and/or transfers;A little help with bathing/dressing/bathroom;Assistance with cooking/housework;Assist for transportation;Help with stairs or ramp for entrance    Equipment Recommendations Rolling walker (2 wheels);BSC/3in1  Recommendations for Other Services  OT consult (will order per protocol)    Functional Status Assessment Patient has had a recent decline in their functional status and demonstrates the ability to make significant improvements in function in a reasonable and predictable amount of time.     Precautions / Restrictions Precautions Precautions: Fall;Other (comment) Precaution Comments: Weight bearing through R heel in postop shoe Required Braces or Orthoses: Other Brace Other Brace: post op shoe Restrictions Weight Bearing Restrictions: Yes RLE Weight Bearing:  (WB through heel in postop shoe) Other Position/Activity Restrictions: Weight bearing on heel in postop shoe RLE      Mobility  Bed Mobility Overal bed mobility: Needs Assistance Bed Mobility: Supine to Sit     Supine to sit: Supervision     General bed mobility comments: Moving slowly, but no phsyical assist needed    Transfers Overall transfer level: Needs assistance Equipment used: Rolling walker (2 wheels) Transfers: Sit to/from Stand, Bed to chair/wheelchair/BSC Sit to Stand: Min assist   Step pivot transfers: Min assist       General transfer comment: Near constant cues for keeping weight on his R heel in stance    Ambulation/Gait Ambulation/Gait assistance: Min guard, Min assist Gait Distance (Feet): 3 Feet Assistive device: Rolling walker (2 wheels) Gait Pattern/deviations: Decreased stance  time - right        General Gait Details: Cues to use RW for balance while he is heel-walking on jRLE  Stairs            Wheelchair Mobility    Modified Rankin (Stroke Patients Only)       Balance Overall balance assessment: Mild deficits observed, not formally tested                                           Pertinent Vitals/Pain Pain Assessment Pain Assessment: Faces Faces Pain Scale: Hurts little more Pain Location: R foot Pain Descriptors / Indicators: Sore Pain Intervention(s): Monitored during session, Repositioned    Home Living Family/patient expects to be discharged to:: Private residence Living Arrangements: Alone Available Help at Discharge: Family;Available PRN/intermittently Type of Home: House Home Access: Stairs to enter Entrance Stairs-Rails: None Entrance Stairs-Number of Steps: 4-5   Home Layout: One level Home Equipment: Agricultural consultant (2 wheels);Cane - single point;Shower seat;BSC/3in1 Additional Comments: Above indo gleaned from chart review, and will need to be verified; info per patient who states he has memory problems    Prior Function Prior Level of Function : Independent/Modified Independent             Mobility Comments: Community ambulator without AD, does not drive ADLs Comments: Assisted by family     Hand Dominance        Extremity/Trunk Assessment   Upper Extremity Assessment Upper Extremity Assessment: Overall WFL for tasks assessed    Lower Extremity Assessment Lower Extremity Assessment: RLE deficits/detail RLE Deficits / Details: Forefoot dressed; tolerated donning postop shoe well; Noted some soreness when RLE is on the floor in standing    Cervical / Trunk Assessment Cervical / Trunk Assessment: Normal  Communication   Communication: No difficulties  Cognition Arousal/Alertness: Awake/alert Behavior During Therapy: WFL for tasks assessed/performed Overall Cognitive Status: No family/caregiver present to  determine baseline cognitive functioning                                 General Comments: Pt pleaseantly confused, following all commands/requests; disoriented to time, place, situation        General Comments General comments (skin integrity, edema, etc.): Pleasantly confused    Exercises     Assessment/Plan    PT Assessment Patient needs continued PT services  PT Problem List Decreased strength;Decreased activity tolerance;Decreased balance;Decreased mobility;Decreased cognition;Decreased knowledge of use of DME;Decreased safety awareness;Decreased knowledge of precautions;Pain       PT Treatment Interventions DME instruction;Gait training;Stair training;Functional mobility training;Therapeutic activities;Therapeutic exercise;Balance training;Neuromuscular re-education;Cognitive remediation;Patient/family education;Wheelchair mobility training    PT Goals (Current goals can be found in the Care Plan section)  Acute Rehab PT Goals Patient Stated Goal: Did not specifically state, but agrees to get up for breakfast PT Goal Formulation: Patient unable to participate in goal setting Time For Goal Achievement: 07/22/22 Potential to Achieve Goals: Good    Frequency Min 3X/week     Co-evaluation               AM-PAC PT "6 Clicks" Mobility  Outcome Measure Help needed turning from your back to your side while in a flat bed without using bedrails?: None Help needed moving from lying on your back to sitting on the side of a flat bed  without using bedrails?: A Little Help needed moving to and from a bed to a chair (including a wheelchair)?: A Little Help needed standing up from a chair using your arms (e.g., wheelchair or bedside chair)?: A Little Help needed to walk in hospital room?: A Little Help needed climbing 3-5 steps with a railing? : A Little 6 Click Score: 19    End of Session Equipment Utilized During Treatment: Gait belt;Other (comment) (post-op  shoe) Activity Tolerance: Patient tolerated treatment well Patient left: in chair;with call bell/phone within reach;with chair alarm set;with nursing/sitter in room Nurse Communication: Mobility status PT Visit Diagnosis: Unsteadiness on feet (R26.81);Other abnormalities of gait and mobility (R26.89);Muscle weakness (generalized) (M62.81);Pain Pain - Right/Left: Right Pain - part of body: Ankle and joints of foot    Time: 4098-1191 PT Time Calculation (min) (ACUTE ONLY): 18 min   Charges:   PT Evaluation $PT Eval Moderate Complexity: 1 Mod          Van Clines, PT  Acute Rehabilitation Services Office [phone]   Levi Aland 07/08/2022, 1:01 PM

## 2022-07-08 NOTE — Progress Notes (Signed)
PROGRESS NOTE   Alan Meyers  ZOX:[id number] DOB: 05-Aug-1935 DOA: 07/06/2022 PCP: Donetta Potts, MD  Brief Narrative:  87 year old white male home dwelling Character to artery stenosis status post L CEA 10/2012 + CVA HTN, HLD Prior smoker, H. pylori on endoscopy 10/2021 Dementia followed by [facility] neurology MoCA testing 09/3008/2023 started on Aricept previously now on Namenda Recently following with [facility] podiatry since 06/2022 status post several debridements and several rounds of doxycycline/Keflex with concerns for osteomyelitis MRI performed showing osteo and admitted to the hospital Podiatrist consulted Vascular surgery consulted  5/11 ray amputation right foot Dr. 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 Pascal Lux in a.m.  Stroke, L CEA 2014 prior smoker Continue aspirin 81  HTN HLD not on meds from prior to admission? Follow trends of blood pressure-do not treat  Severe dementia previously on Mini-Mental testing Previously on Aricept-not on anything at this time-will discuss implications with family  DVT prophylaxis: SCD Code Status: FULL Family Communication:  no family is present today  Disposition:  Status is: Inpatient Remains inpatient appropriate because:   Needs cult    Subjective:  Surprisingly coherent and alert but was a little bit confused overnight requiring a sitter No chest pain Pretty independent and ambulatory with standby assist only   Objective: Vitals:   07/07/22 1637 07/08/22 0355 07/08/22 0500 07/08/22 0759  BP: (!) 120/59 (!) 144/76  (!) 144/77  Pulse: 77 86  93  Resp: 16 17  17   Temp:  97.8 F (36.6 C) 97.8 F (36.6 C)  98.1 F (36.7 C)  TempSrc:  Oral  Oral  SpO2: 97% 96%  96%  Weight:   58.3 kg   Height:        Intake/Output Summary (Last 24 hours) at 07/08/2022 1142 Last data filed at 07/08/2022 0800 Gross per 24 hour  Intake 1602.72 ml  Output 600 ml  Net 1002.72 ml    Filed Weights   07/06/22 1444 07/08/22 0500  Weight: 57.2 kg 58.3 kg    Examination:  Coherent x 2 pleasant Chest clear no rales rhonchi S1-S2 no murmur Abdomen soft no rebound Boot on right lower extremity Tattoo on right hand  Data Reviewed: personally reviewed   CBC    Component Value Date/Time   WBC 6.4 07/07/2022 0034   RBC 3.20 (L) 07/07/2022 0034   HGB 9.6 (L) 07/07/2022 0034   HCT 29.5 (L) 07/07/2022 0034   PLT 278 07/07/2022 0034   MCV 92.2 07/07/2022 0034   MCH 30.0 07/07/2022 0034   MCHC 32.5 07/07/2022 0034   RDW 13.6 07/07/2022 0034   LYMPHSABS 1.6 07/06/2022 1511   MONOABS 0.9 07/06/2022 1511   EOSABS 0.4 07/06/2022 1511   BASOSABS 0.1 07/06/2022 1511      Latest Ref Rng & Units 07/07/2022   12:34 AM 07/06/2022    3:11 PM 06/20/2022    4:15 AM  CMP  Glucose 70 - 99 mg/dL 91  811  79   BUN 8 - 23 mg/dL 10  15  23    Creatinine 0.61 - 1.24 mg/dL 9.14  7.82  9.56   Sodium 135 - 145 mmol/L 136  138  136  Potassium 3.5 - 5.1 mmol/L 3.8  4.7  3.2   Chloride 98 - 111 mmol/L 106  105  104   CO2 22 - 32 mmol/L 23  25  24    Calcium 8.9 - 10.3 mg/dL 8.1  8.7  8.3   Total Protein 6.5 - 8.1 g/dL 5.6  7.2    Total Bilirubin 0.3 - 1.2 mg/dL 0.8  0.8    Alkaline Phos 38 - 126 U/L 58  77    AST 15 - 41 U/L 16  20    ALT 0 - 44 U/L 13  15       Radiology Studies: No results found.   Scheduled Meds:  aspirin  81 mg Oral Daily   Continuous Infusions:  sodium chloride     ceFEPime (MAXIPIME) IV 2 g (07/08/22 1332)   metronidazole 500 mg (07/08/22 1420)   vancomycin 750 mg (07/08/22 1451)     LOS: 2 days   Time spent: 62  Rhetta Mura, MD [facility] To contact the attending provider between 7A-7P or the covering provider during after hours 7P-7A, please log into the web site [email or web address] and access using universal [place] password for that web site. If you do not have the password, please call the hospital operator.  07/08/2022, 11:42 AM

## 2022-07-09 ENCOUNTER — Ambulatory Visit: Payer: Medicare Other | Admitting: Podiatry

## 2022-07-09 ENCOUNTER — Inpatient Hospital Stay (HOSPITAL_COMMUNITY)
Admission: EM | Disposition: A | Discharge status: Home Health | Payer: Self-pay | Source: Home / Self Care | Attending: Family Medicine

## 2022-07-09 DIAGNOSIS — M86671 Other chronic osteomyelitis, right ankle and foot: Secondary | ICD-10-CM | POA: Diagnosis not present

## 2022-07-09 DIAGNOSIS — M869 Osteomyelitis, unspecified: Secondary | ICD-10-CM | POA: Diagnosis not present

## 2022-07-09 HISTORY — PX: ABDOMINAL AORTOGRAM W/LOWER EXTREMITY: CATH118223

## 2022-07-09 HISTORY — PX: PERIPHERAL VASCULAR INTERVENTION: CATH118257

## 2022-07-09 LAB — POCT ACTIVATED CLOTTING TIME: Activated Clotting Time: 196 seconds

## 2022-07-09 LAB — RENAL FUNCTION PANEL
Albumin: 2.4 g/dL — ABNORMAL LOW (ref 3.5–5.0)
Anion gap: 11 (ref 5–15)
BUN: 15 mg/dL (ref 8–23)
CO2: 18 mmol/L — ABNORMAL LOW (ref 22–32)
Calcium: 8.2 mg/dL — ABNORMAL LOW (ref 8.9–10.3)
Chloride: 106 mmol/L (ref 98–111)
Creatinine, Ser: 1.12 mg/dL (ref 0.61–1.24)
GFR, Estimated: >60 mL/min (ref >60)
Glucose, Bld: 103 mg/dL — ABNORMAL HIGH (ref 70–99)
Phosphorus: 3.4 mg/dL (ref 2.5–4.6)
Potassium: 4.1 mmol/L (ref 3.5–5.1)
Sodium: 135 mmol/L (ref 135–145)

## 2022-07-09 LAB — CBC
HCT: 29.7 % — ABNORMAL LOW (ref 39.0–52.0)
Hemoglobin: 10 g/dL — ABNORMAL LOW (ref 13.0–17.0)
MCH: 30.2 pg (ref 26.0–34.0)
MCHC: 33.7 g/dL (ref 30.0–36.0)
MCV: 89.7 fL (ref 80.0–100.0)
Platelets: 301 10*3/uL (ref 150–400)
RBC: 3.31 MIL/uL — ABNORMAL LOW (ref 4.22–5.81)
RDW: 13.8 % (ref 11.5–15.5)
WBC: 7.8 10*3/uL (ref 4.0–10.5)
nRBC: 0 % (ref 0.0–0.2)

## 2022-07-09 LAB — VAS US ABI WITH/WO TBI: Left ABI: 0.7

## 2022-07-09 LAB — AEROBIC/ANAEROBIC CULTURE W GRAM STAIN (SURGICAL/DEEP WOUND)

## 2022-07-09 LAB — CULTURE, BLOOD (ROUTINE X 2)

## 2022-07-09 SURGERY — ABDOMINAL AORTOGRAM W/LOWER EXTREMITY
Anesthesia: LOCAL

## 2022-07-09 MED ORDER — LIDOCAINE HCL (PF) 1 % IJ SOLN
INTRAMUSCULAR | Status: AC
  Filled 2022-07-09: qty 30

## 2022-07-09 MED ORDER — FENTANYL CITRATE (PF) 100 MCG/2ML IJ SOLN
INTRAMUSCULAR | Status: AC
  Filled 2022-07-09: qty 2

## 2022-07-09 MED ORDER — CLOPIDOGREL BISULFATE 300 MG PO TABS
ORAL_TABLET | ORAL | Status: DC | PRN
  Administered 2022-07-09: 600 mg via ORAL

## 2022-07-09 MED ORDER — HEPARIN (PORCINE) IN NACL 1000-0.9 UT/500ML-% IV SOLN
INTRAVENOUS | Status: DC | PRN
  Administered 2022-07-09 (×2): 500 mL

## 2022-07-09 MED ORDER — ACETAMINOPHEN 325 MG PO TABS: 650.0000 mg | ORAL_TABLET | ORAL | Status: DC | PRN

## 2022-07-09 MED ORDER — CLOPIDOGREL BISULFATE 75 MG PO TABS: 600.0000 mg | ORAL_TABLET | Freq: Once | ORAL | Status: DC

## 2022-07-09 MED ORDER — LIDOCAINE HCL (PF) 1 % IJ SOLN
INTRAMUSCULAR | Status: DC | PRN
  Administered 2022-07-09: 3 mL
  Administered 2022-07-09: 15 mL

## 2022-07-09 MED ORDER — SODIUM CHLORIDE 0.9 % IV SOLN: 250.0000 mL | INTRAVENOUS | Status: DC | PRN

## 2022-07-09 MED ORDER — CLOPIDOGREL BISULFATE 75 MG PO TABS: 75.0000 mg | ORAL_TABLET | Freq: Every day | ORAL | Status: DC

## 2022-07-09 MED ORDER — SODIUM CHLORIDE 0.9% FLUSH: 3.0000 mL | Freq: Two times a day (BID) | INTRAVENOUS | Status: DC

## 2022-07-09 MED ORDER — ONDANSETRON HCL 4 MG/2ML IJ SOLN: 4.0000 mg | Freq: Four times a day (QID) | INTRAMUSCULAR | Status: DC | PRN

## 2022-07-09 MED ORDER — LABETALOL HCL 5 MG/ML IV SOLN: 10.0000 mg | INTRAVENOUS | Status: DC | PRN

## 2022-07-09 MED ORDER — HYDRALAZINE HCL 20 MG/ML IJ SOLN
INTRAMUSCULAR | Status: DC | PRN
  Administered 2022-07-09 (×2): 10 mg via INTRAVENOUS

## 2022-07-09 MED ORDER — HYDRALAZINE HCL 20 MG/ML IJ SOLN
INTRAMUSCULAR | Status: AC
  Filled 2022-07-09: qty 1

## 2022-07-09 MED ORDER — MIDAZOLAM HCL 2 MG/2ML IJ SOLN
INTRAMUSCULAR | Status: AC
  Filled 2022-07-09: qty 2

## 2022-07-09 MED ORDER — HYDRALAZINE HCL 20 MG/ML IJ SOLN: 5.0000 mg | INTRAMUSCULAR | Status: DC | PRN

## 2022-07-09 MED ORDER — CLOPIDOGREL BISULFATE 300 MG PO TABS
ORAL_TABLET | ORAL | Status: AC
  Filled 2022-07-09: qty 1

## 2022-07-09 MED ORDER — ROSUVASTATIN CALCIUM 5 MG PO TABS
5.0000 mg | ORAL_TABLET | Freq: Every day | ORAL | Status: DC
  Administered 2022-07-09 – 2022-07-10 (×2): 5 mg via ORAL
  Filled 2022-07-09 (×2): qty 1

## 2022-07-09 MED ORDER — HEPARIN SODIUM (PORCINE) 1000 UNIT/ML IJ SOLN
INTRAMUSCULAR | Status: DC | PRN
  Administered 2022-07-09: 6000 [IU] via INTRAVENOUS

## 2022-07-09 MED ORDER — SODIUM CHLORIDE 0.9 % IV SOLN: INTRAVENOUS | Status: AC

## 2022-07-09 MED ORDER — IODIXANOL 320 MG/ML IV SOLN
INTRAVENOUS | Status: DC | PRN
  Administered 2022-07-09: 140 mL

## 2022-07-09 MED ORDER — CLOPIDOGREL BISULFATE 75 MG PO TABS
75.0000 mg | ORAL_TABLET | Freq: Every day | ORAL | Status: DC
  Administered 2022-07-10: 75 mg via ORAL
  Filled 2022-07-09: qty 1

## 2022-07-09 MED ORDER — SODIUM CHLORIDE 0.9% FLUSH: 3.0000 mL | INTRAVENOUS | Status: DC | PRN

## 2022-07-09 SURGICAL SUPPLY — 26 items
BALLN MUSTANG 5.0X40 135 (BALLOONS) ×2
BALLN MUSTANG 5X150X135 (BALLOONS) ×4
BALLOON MUSTANG 5.0X40 135 (BALLOONS) IMPLANT
BALLOON MUSTANG 5X150X135 (BALLOONS) IMPLANT
CATH OMNI FLUSH 5F 65CM (CATHETERS) IMPLANT
CATH QUICKCROSS .035X135CM (MICROCATHETER) IMPLANT
CATH QUICKCROSS SUPP .018X90CM (MICROCATHETER) IMPLANT
GLIDESHEATH SLEND SS 6F .021 (SHEATH) IMPLANT
KIT ENCORE 26 ADVANTAGE (KITS) IMPLANT
KIT MICROPUNCTURE NIT STIFF (SHEATH) IMPLANT
KIT PV (KITS) ×2 IMPLANT
PATCH THROMBIX TOPICAL PLAIN (HEMOSTASIS) IMPLANT
SHEATH 6FR 85 DEST SLENDER (SHEATH) IMPLANT
SHEATH GLIDE SLENDER 4/5FR (SHEATH) IMPLANT
SHEATH MICROPUNCTURE PEDAL 4FR (SHEATH) IMPLANT
SHEATH PINNACLE 5F 10CM (SHEATH) IMPLANT
SHEATH PROBE COVER 6X72 (BAG) IMPLANT
STENT ELUVIA 6X150X130 (Permanent Stent) IMPLANT
STENT ELUVIA 6X40X130 (Permanent Stent) IMPLANT
SYR MEDRAD MARK 7 150ML (SYRINGE) ×2 IMPLANT
TRANSDUCER W/STOPCOCK (MISCELLANEOUS) ×2 IMPLANT
TRAY PV CATH (CUSTOM PROCEDURE TRAY) ×2 IMPLANT
TUBING CIL FLEX 10 FLL-RA (TUBING) IMPLANT
WIRE G V18X300CM (WIRE) IMPLANT
WIRE ROSEN-J .035X260CM (WIRE) IMPLANT
WIRE STARTER BENTSON 035X150 (WIRE) IMPLANT

## 2022-07-09 NOTE — TOC Progression Note (Signed)
Transition of Care (TOC) - Progression Note   [facility] unable to provide [facility].   Tresa Endo with [facility] accepted for HHPT,OT,RN  Patient Details  Name: Cortavius Barrentine MRN: [id number] Date of Birth: 1935-11-17  Transition of Care [facility]) CM/SW Contact  Sandhya Denherder, Adria Devon, RN Phone Number: 07/09/2022, 4:49 PM  Clinical Narrative:       Expected Discharge Plan: Home w Home Health Services Barriers to Discharge: Continued Medical Work up  Expected Discharge Plan and Services In-house Referral: Clinical Social Work   Post Acute Care Choice: Durable Medical Equipment, Home Health Living arrangements for the past 2 months: Single Family Home                 DME Arranged: Dan Humphreys, Bedside commode DME Agency: [employer] Date DME Agency Contacted: 07/09/22 Time DME Agency Contacted: [phone] Representative spoke with at DME Agency: Vaughan Basta HH Arranged: PT, OT HH Agency: [facility] Date [facility] Agency Contacted: 07/09/22 Time HH Agency Contacted: 1632 Representative spoke with at [facility] Agency: Denyse Amass   Social Determinants of Health (SDOH) Interventions SDOH Screenings   Tobacco Use: Medium Risk (07/08/2022)    Readmission Risk Interventions     No data to display

## 2022-07-09 NOTE — Progress Notes (Addendum)
   Progress Note    07/09/2022 9:53 AM 2 Days Post-Op  Subjective:   Sitter at bedside. Reports patient had a good night   Vitals:   07/09/22 0327 07/09/22 0843  BP: 137/69 120/66  Pulse: 87 68  Resp: 18 18  Temp: 97.9 F (36.6 C) 98.1 F (36.7 C)  SpO2: 97% 100%    Physical Exam: Awake and alert Resting on right foot  CBC    Component Value Date/Time   WBC 7.8 07/09/2022 0446   RBC 3.31 (L) 07/09/2022 0446   HGB 10.0 (L) 07/09/2022 0446   HCT 29.7 (L) 07/09/2022 0446   PLT 301 07/09/2022 0446   MCV 89.7 07/09/2022 0446   MCH 30.2 07/09/2022 0446   MCHC 33.7 07/09/2022 0446   RDW 13.8 07/09/2022 0446   LYMPHSABS 1.6 07/06/2022 1511   MONOABS 0.9 07/06/2022 1511   EOSABS 0.4 07/06/2022 1511   BASOSABS 0.1 07/06/2022 1511    BMET    Component Value Date/Time   NA 135 07/09/2022 0043   K 4.1 07/09/2022 0043   CL 106 07/09/2022 0043   CO2 18 (L) 07/09/2022 0043   GLUCOSE 103 (H) 07/09/2022 0043   BUN 15 07/09/2022 0043   CREATININE 1.12 07/09/2022 0043   CREATININE 1.00 08/18/2012 1330   CALCIUM 8.2 (L) 07/09/2022 0043   GFRNONAA >60 07/09/2022 0043   GFRAA >90 10/29/2012 0415    INR    Component Value Date/Time   INR 0.92 10/22/2012 1322     Intake/Output Summary (Last 24 hours) at 07/09/2022 0953 Last data filed at 07/09/2022 1610 Gross per 24 hour  Intake 1437.52 ml  Output --  Net 1437.52 ml     Assessment:  87 y.o. male is status post transmetatarsal amputation of right first toe with mildly depressed ABIs on the right with monophasic flow distally by duplex appears to be again in the common femoral artery.  Patient has history of EVAR.  Plan: Cath Lab today for right lower possible intervention..  Breakfast then n.p.o.  Okay for aspirin this morning    Caio Devera C. Randie Heinz, MD [facility] Office: [phone] Pager: [phone]  07/09/2022 9:53 AM

## 2022-07-09 NOTE — Care Management Important Message (Signed)
Important Message  Patient Details  Name: Alan Meyers MRN: [id number] Date of Birth: 09/29/1935   Medicare Important Message Given:  Yes     Sherilyn Banker 07/09/2022, 4:05 PM

## 2022-07-09 NOTE — Progress Notes (Addendum)
PROGRESS NOTE   Alan Meyers  BJY:[id number] DOB: 11/30/1935 DOA: 07/06/2022 PCP: Donetta Potts, MD  Brief Narrative:  87 year old white male home dwelling Character to artery stenosis status post L CEA 10/2012 + CVA HTN, HLD Prior smoker, H. pylori on endoscopy 10/2021 Dementia followed by [facility] neurology MoCA testing 09/3008/2023 started on Aricept previously now on Namenda Recently following with [facility] podiatry since 06/2022 status post several debridements and several rounds of doxycycline/Keflex with concerns for osteomyelitis MRI performed showing osteo and admitted to the hospital Podiatrist consulted Vascular surgery consulted  5/11 ray amputation right foot Dr. 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 etc. to p.o. Augmentin for total of 14 days given clear margins as per my discussion verbally with Dr. Lilian Kapur and as per discussion with infectious disease Dr. Ilsa Iha 50 cc/H saline-keep at this rate until end of arteriogram scheduled for 5/13 Pain control Norco 1-2 and can augment as needed  Stroke, L CEA 2014 prior smoker Continue aspirin 81  HTN HLD not on meds from prior to admission? Follow trends of blood pressure-do not treat  Severe dementia previously on Mini-Mental testing Previously on Aricept-not on anything at this time-will discuss implications with family  DVT prophylaxis: SCD Code Status: FULL Family Communication:  no family is present today  Disposition:  Status is: Inpatient Remains inpatient appropriate because:   Needs cult    Subjective:  More coherent today quite tangential Laying in bed and is n.p.o. for procedure after breakfast No issues overall    Objective: Vitals:   07/08/22 2049 07/09/22 0327 07/09/22 0843 07/09/22 1255  BP: 106/64 137/69 120/66 (!)  115/51  Pulse: 86 87 68 74  Resp: 18 18 18 18   Temp: 98.1 F (36.7 C) 97.9 F (36.6 C) 98.1 F (36.7 C) 98 F (36.7 C)  TempSrc: Axillary Oral Oral Oral  SpO2: 98% 97% 100% 100%  Weight:      Height:        Intake/Output Summary (Last 24 hours) at 07/09/2022 1421 Last data filed at 07/09/2022 0928 Gross per 24 hour  Intake 1197.52 ml  Output --  Net 1197.52 ml    Filed Weights   07/06/22 1444 07/08/22 0500  Weight: 57.2 kg 58.3 kg    Examination:  Coherent x 1 looks about stated age EOMI NCAT Chest clear no rales rhonchi S1-S2 no murmur Abdomen soft no rebound Boot on right lower extremity  Data Reviewed: personally reviewed   CBC    Component Value Date/Time   WBC 7.8 07/09/2022 0446   RBC 3.31 (L) 07/09/2022 0446   HGB 10.0 (L) 07/09/2022 0446   HCT 29.7 (L) 07/09/2022 0446   PLT 301 07/09/2022 0446   MCV 89.7 07/09/2022 0446   MCH 30.2 07/09/2022 0446   MCHC 33.7 07/09/2022 0446   RDW 13.8 07/09/2022 0446   LYMPHSABS 1.6 07/06/2022 1511   MONOABS 0.9 07/06/2022 1511   EOSABS 0.4 07/06/2022 1511   BASOSABS 0.1 07/06/2022 1511      Latest Ref Rng & Units 07/09/2022   12:43 AM 07/07/2022   12:34 AM 07/06/2022    3:11 PM  CMP  Glucose 70 - 99 mg/dL 086  91  578   BUN 8 - 23 mg/dL 15  10  15    Creatinine 0.61 - 1.24 mg/dL 4.69  6.29  5.28   Sodium  135 - 145 mmol/L 135  136  138   Potassium 3.5 - 5.1 mmol/L 4.1  3.8  4.7   Chloride 98 - 111 mmol/L 106  106  105   CO2 22 - 32 mmol/L 18  23  25    Calcium 8.9 - 10.3 mg/dL 8.2  8.1  8.7   Total Protein 6.5 - 8.1 g/dL  5.6  7.2   Total Bilirubin 0.3 - 1.2 mg/dL  0.8  0.8   Alkaline Phos 38 - 126 U/L  58  77   AST 15 - 41 U/L  16  20   ALT 0 - 44 U/L  13  15      Radiology Studies: VAS [place] ABI WITH/WO TBI  Result Date: 07/09/2022  LOWER EXTREMITY DOPPLER STUDY Patient Name:  Alan Meyers  Date of Exam:   07/08/2022 Medical Rec #: [id number]     Accession #:    [id number] Date of Birth: [birth year]     Patient Gender: M Patient Age:   56 years Exam Location:  [facility] Procedure:      VAS [place] ABI WITH/WO TBI Referring Phys: ADAM MCDONALD --------------------------------------------------------------------------------  Indications: RLE foot infection S/P ray amputation High Risk         Hypertension, hyperlipidemia, past history of smoking, prior Factors:          CVA.  Vascular Interventions: Left CEA 2014, AAA repair/graft 2014. Limitations: Today's exam was limited due to involuntary patient movement. Comparison Study: No previous exams Performing Technologist: Hill, Jody RVT, RDMS  Examination Guidelines: A complete evaluation includes at minimum, Doppler waveform signals and systolic blood pressure reading at the level of bilateral brachial, anterior tibial, and posterior tibial arteries, when vessel segments are accessible. Bilateral testing is considered an integral part of a complete examination. Photoelectric Plethysmograph (PPG) waveforms and toe systolic pressure readings are included as required and additional duplex testing as needed. Limited examinations for reoccurring indications may be performed as noted.  ABI Findings: +---------+------------------+-----+----------+----------+ Right    Rt Pressure (mmHg)IndexWaveform  Comment    +---------+------------------+-----+----------+----------+ Brachial 167                    triphasic            +---------+------------------+-----+----------+----------+ PTA      107               0.63 monophasic           +---------+------------------+-----+----------+----------+ DP       98                0.58 monophasic           +---------+------------------+-----+----------+----------+ Great Toe                                 Amputation +---------+------------------+-----+----------+----------+ +---------+------------------+-----+----------+-------+ Left     Lt Pressure (mmHg)IndexWaveform  Comment  +---------+------------------+-----+----------+-------+ Brachial 170                    triphasic         +---------+------------------+-----+----------+-------+ PTA      119               0.70 monophasic        +---------+------------------+-----+----------+-------+ DP       109               0.64 monophasic        +---------+------------------+-----+----------+-------+  Great Toe73                0.43 Abnormal          +---------+------------------+-----+----------+-------+ +-------+-----------+-----------+------------+------------+ ABI/TBIToday's ABIToday's TBIPrevious ABIPrevious TBI +-------+-----------+-----------+------------+------------+ Right  0.63                                           +-------+-----------+-----------+------------+------------+ Left   0.70       0.43                                +-------+-----------+-----------+------------+------------+  Summary: Right: Resting right ankle-brachial index indicates moderate right lower extremity arterial disease. Left: Resting left ankle-brachial index indicates moderate left lower extremity arterial disease. The left toe-brachial index is abnormal. *See table(s) above for measurements and observations.  Electronically signed by Sherald Hess MD on 07/09/2022 at 1:43:55 PM.    Final    VAS [place] LOWER EXTREMITY ARTERIAL DUPLEX  Result Date: 07/09/2022 LOWER EXTREMITY ARTERIAL DUPLEX STUDY Patient Name:  Alan Meyers  Date of Exam:   07/08/2022 Medical Rec #: [id number]     Accession #:    [id number] Date of Birth: Jan 11, 1936    Patient Gender: M Patient Age:   32 years Exam Location:  [facility] Procedure:      VAS [place] LOWER EXTREMITY ARTERIAL DUPLEX Referring Phys: ADAM MCDONALD --------------------------------------------------------------------------------  Indications: Peripheral artery disease, and S/P ray amputation to RLE. High Risk         Hypertension, hyperlipidemia, past history of  smoking, prior Factors:          CVA.  Vascular Interventions: Left CEA (2014), AAA repair/graft (2014). Current ABI:            0.63 / 0.70 Limitations: Patient continuously moving throughout exam, poor positioning,              heavy calcific shadowing. Comparison Study: No previous exams Performing Technologist: Jody Hill RVT, RDMS  Examination Guidelines: A complete evaluation includes B-mode imaging, spectral Doppler, color Doppler, and power Doppler as needed of all accessible portions of each vessel. Bilateral testing is considered an integral part of a complete examination. Limited examinations for reoccurring indications may be performed as noted.  +-----------+-------+-----+-------------+---------------+---------------------+ RIGHT      PSV    RatioStenosis     Waveform       Comments                         cm/s                                                          +-----------+-------+-----+-------------+---------------+---------------------+ EIA Mid    110                      biphasic                             +-----------+-------+-----+-------------+---------------+---------------------+ EIA Distal 145                      biphasic                             +-----------+-------+-----+-------------+---------------+---------------------+  CFA Prox   105                      audibly                                                                  biphasic                             +-----------+-------+-----+-------------+---------------+---------------------+ CFA Mid    96                       audibly                                                                  biphasic                             +-----------+-------+-----+-------------+---------------+---------------------+ CFA Distal 125         30-49%       monophasic     turbulent                                    stenosis                                           +-----------+-------+-----+-------------+---------------+---------------------+ DFA        210         50-74%       monophasic     turbulent                                    stenosis                                          +-----------+-------+-----+-------------+---------------+---------------------+ SFA Prox   43                       monophasic                           +-----------+-------+-----+-------------+---------------+---------------------+ SFA Mid    220         50-74%       monophasic     short segment                                stenosis                    occlusion             +-----------+-------+-----+-------------+---------------+---------------------+  SFA Distal 24                       monophasic                           +-----------+-------+-----+-------------+---------------+---------------------+ POP Prox   26                       monophasic                           +-----------+-------+-----+-------------+---------------+---------------------+ POP Mid    45                       monophasic                           +-----------+-------+-----+-------------+---------------+---------------------+ POP Distal 210         50-74%       monophasic                                                  stenosis                                          +-----------+-------+-----+-------------+---------------+---------------------+ TP Trunk   51                       monophasic                           +-----------+-------+-----+-------------+---------------+---------------------+ ATA Prox   32                       monophasic                           +-----------+-------+-----+-------------+---------------+---------------------+ ATA Mid    35                       monophasic                           +-----------+-------+-----+-------------+---------------+---------------------+ ATA Distal 62                        monophasic                           +-----------+-------+-----+-------------+---------------+---------------------+ PTA Prox   43                       monophasic                           +-----------+-------+-----+-------------+---------------+---------------------+ PTA Mid    39                       monophasic                           +-----------+-------+-----+-------------+---------------+---------------------+  PTA Distal 45                       monophasic                           +-----------+-------+-----+-------------+---------------+---------------------+ PERO Prox  60                       monophasic                           +-----------+-------+-----+-------------+---------------+---------------------+ PERO Mid   47                       monophasic                           +-----------+-------+-----+-------------+---------------+---------------------+ PERO Distal70                       monophasic                           +-----------+-------+-----+-------------+---------------+---------------------+  Summary: Right: 30-49% stenosis noted in the distal common femoral artery. There is a 50-74% stenosis in the proximal PFA. 50-74% stenosis noted in the mid superficial femoral artery. There is a short segment occlusion of mid/distal SFA that is reconstituted through collaterals. 50-74% stenosis noted in the distal popliteal artery. Severity of stenosis may be underestimated due to heavy calcific shadowing. Presence of collateral vessels seen throughout RLE.  See table(s) above for measurements and observations. Electronically signed by Sherald Hess MD on 07/09/2022 at 1:43:43 PM.    Final      Scheduled Meds:  aspirin  81 mg Oral Daily   Continuous Infusions:  sodium chloride Stopped (07/09/22 0320)   ceFEPime (MAXIPIME) IV 2 g (07/09/22 1342)   metronidazole Stopped (07/09/22 0149)   vancomycin Stopped (07/08/22 1551)     LOS: 3  days   Time spent: 78  Rhetta Mura, MD [facility] To contact the attending provider between 7A-7P or the covering provider during after hours 7P-7A, please log into the web site [email or web address] and access using universal  password for that web site. If you do not have the password, please call the hospital operator.  07/09/2022, 2:21 PM

## 2022-07-09 NOTE — Op Note (Signed)
Patient name: Alan Meyers MRN: [id number] DOB: 22-Feb-1936 Sex: male  07/09/2022 Pre-operative Diagnosis: Chronic right lower extremity limb threatening ischemia status post toe amputation Post-operative diagnosis:  Same Surgeon:  Luanna Salk. Randie Heinz, MD Procedure Performed: 1.  Ultrasound-guided cannulation left common femoral artery 2.  Catheter selection aorta and aortogram with bilateral lower extremity 3.  Ultrasound-guided cannulation right anterior tibial artery 4.  Stent of right SFA with 2 x 6 x the Elluvia 5.  Stent of right popliteal artery with 6 x 40 mm Eluvia  Indications: 87 year old male with history of endovascular aneurysm repair as well as carotid repair in the past.  He now is status post right transmetatarsal first toe amputation with mildly depressed ABIs and monophasic flow by duplex.  He is indicated for angiography with possible intervention.  Findings: There is an endograft in the aorta in the infrarenal position there are no endoleak's identified the limbs are crossed there is brisk flow in the bilateral external iliac arteries with common femoral artery is also patent.  On the left side the SFA is heavily diseased as is the popliteal appears to have inline flow although severe disease throughout via the peroneal artery.  On the right the SFA is very diminutive heavily diseased proximally and then occludes for the long segment and reconstitutes above the knee and at the knee there is 60% stenosis and distally there is brisk flow via the peroneal artery and the anterior tibial artery is proximally occluded and reconstitutes with flow to the foot.  We were able to cannulate the anterior tibial artery and then retrograde treat the SFA with stenting of the popliteal artery also with stenting and the intertibial artery was then patent with a pressure of 145 mmHg at the ankle and a palpable dorsalis pedis at conclusion.   Procedure:  The patient was identified in  the holding area and taken to room 8.  The patient was then placed supine on the table and prepped and draped in the usual sterile fashion.  A time out was called.  Ultrasound was used to evaluate the left common femoral artery which was patent and very large and patulous.  The left common femoral artery was cannulated with micropuncture needle followed by wire and the sheath and images saved to the permanent record.  We placed a Bentson wire followed by a 5 French sheath and Omni catheter to the level of L1 and performed aortogram.  We then performed bilateral lower extremity runoff from the flow divider of the core endovascular aneurysm repair device.  With the above findings we elected for treatment of the right lower extremity.  Right lower extremity was prepped and draped at the level of the ankle.  The anterior tibial artery was easily identified and was patent and the area was anesthetized with 1% lidocaine and cannulated with a micropuncture needle followed by wire and sheath.  Patient was heparinized at this time.  Unfortunately manipulation of left sided catheter and the sheath became dislodged and pressure was held for 45 minutes as the patient was heparinized and hemostasis was noted to be obtained and the patient was hemodynamically stable throughout the case.  On the right side we then used a V18 wire and quick cross catheter we able to cross all the way through the occluded anterior tibial artery then the stenosed popliteal artery and then the occluded SFA up to the common femoral artery with true luminal and confirmed intraluminal access.  We exchanged for a  5 French slender sheath and then placed an 035 wire and then ballooned the entirety of the SFA with a 5 mm balloon.  We then placed a long 6 French slender sheath and then primarily stented the SFA with 2 drug-eluting stents that were 150 mm in length.  These were postdilated with 5 mm balloons and noted to have no residual stenosis.  The sheath  was then retracted and retrograde angiography demonstrated high-grade stenosis of the popliteal artery which was primarily stented with a 40 mm drug-eluting stent and postdilated again with a 5 mm balloon.  We then evaluated the anterior tibial artery and this had been daughtered and noted to be patent.  The sheath was retracted all the way to the distal anterior tibial artery and the wire was removed and there was a systolic pressure of 145 mmHg.  Satisfied with this the sheath was then removed and pressure held until hemostasis obtained.  He tolerated the procedure without immediate complication.   Contrast: 140cc  Wille Aubuchon C. Randie Heinz, MD [facility] Office: [phone] Pager: [phone]

## 2022-07-09 NOTE — TOC Initial Note (Signed)
Transition of Care [facility]) - Initial/Assessment Note    Patient Details  Name: Alan Meyers MRN: [id number] Date of Birth: 1935-07-10  Transition of [facility] CM/SW Contact:    Carley Hammed, LCSW Phone Number: 07/09/2022, 4:33 PM  Clinical Narrative:                 CSW spoke with Gunnar Fusi to discuss PT recommendations. Gunnar Fusi states that pt lives with her, and she is his primary caregiver. CSW and Gunnar Fusi discussed current PT note and Gunnar Fusi feels comfortable taking pt home. Gunnar Fusi states they have a bed side commode and shower seat. They need a walker. CM ordered through [employer]. Family agreeable to [facility] PT/OT no preference. Frances Furbish is able to accept pt's insurance. Dtr will transport home at DC. CM notifying medical team. TOC will continue to follow for DC needs.   Expected Discharge Plan: Home w Home Health Services Barriers to Discharge: Continued Medical Work up   Patient Goals and CMS Choice Patient states their goals for this hospitalization and ongoing recovery are:: Pt disoriented and unable to participate in goal setting.   Choice offered to / list presented to : Adult Children      Expected Discharge Plan and Services In-house Referral: Clinical Social Work   Post Acute Care Choice: Horticulturist, commercial, Home Health Living arrangements for the past 2 months: Single Family Home                 DME Arranged: Dan Humphreys, Bedside commode DME Agency: [employer] Date DME Agency Contacted: 07/09/22 Time DME Agency Contacted: [phone] Representative spoke with at DME Agency: Vaughan Basta HH Arranged: PT, OT HH Agency: [facility] Date [facility] Agency Contacted: 07/09/22 Time HH Agency Contacted: 1632 Representative spoke with at [facility] Agency: Denyse Amass  Prior Living Arrangements/Services Living arrangements for the past 2 months: Single Family Home Lives with:: Adult Children Patient language and need for interpreter reviewed:: Yes Do you feel safe going back to the place  where you live?: Yes      Need for Family Participation in Patient Care: Yes (Comment) Care giver support system in place?: Yes (comment) Current home services: DME Criminal Activity/Legal Involvement Pertinent to Current Situation/Hospitalization: No - Comment as needed  Activities of Daily Living      Permission Sought/Granted Permission sought to share information with : Family Supports Permission granted to share information with : Yes, Verbal Permission Granted  Share Information with NAME: Gunnar Fusi     Permission granted to share info w Relationship: daughter     Emotional Assessment Appearance:: Appears stated age Attitude/Demeanor/Rapport: Unable to Assess Affect (typically observed): Unable to Assess Orientation: : Oriented to Self Alcohol / Substance Use: Not Applicable Psych Involvement: No (comment)  Admission diagnosis:  Osteomyelitis (HCC) [M86.9] Osteomyelitis of right foot, unspecified type [facility]) [M86.9] Patient Active Problem List   Diagnosis Date Noted   Osteomyelitis (HCC) 07/06/2022   Essential hypertension 06/19/2022   Hyperlipidemia 06/19/2022   Peripheral artery disease (HCC) 06/19/2022   Memory loss 06/19/2022   Acute kidney injury superimposed on chronic kidney disease (HCC) 06/19/2022   Cellulitis of right lower leg 06/19/2022   History of CVA (cerebrovascular accident) 06/19/2022   Iron deficiency anemia due to chronic blood loss 11/06/2021   Occlusion and stenosis of carotid artery without mention of cerebral infarction 08/27/2012   AAA (abdominal aortic aneurysm) without rupture-6.1 cm 08/06/2012   PCP:  Donetta Potts, MD Pharmacy:   [facility] [address],  [place] - [address] [place] [place] [address] Phone: [phone] Fax: [phone]     Social Determinants of Health (SDOH) Social History: SDOH Screenings   Tobacco Use: Medium Risk (07/08/2022)   SDOH Interventions:     Readmission Risk Interventions      No data to display

## 2022-07-10 ENCOUNTER — Other Ambulatory Visit: Payer: Self-pay | Admitting: *Deleted

## 2022-07-10 ENCOUNTER — Encounter (HOSPITAL_COMMUNITY): Payer: Self-pay | Admitting: Vascular Surgery

## 2022-07-10 DIAGNOSIS — M869 Osteomyelitis, unspecified: Secondary | ICD-10-CM | POA: Diagnosis not present

## 2022-07-10 DIAGNOSIS — R0989 Other specified symptoms and signs involving the circulatory and respiratory systems: Secondary | ICD-10-CM

## 2022-07-10 DIAGNOSIS — Z9889 Other specified postprocedural states: Secondary | ICD-10-CM

## 2022-07-10 LAB — CBC
HCT: 32.3 % — ABNORMAL LOW (ref 39.0–52.0)
Hemoglobin: 10.6 g/dL — ABNORMAL LOW (ref 13.0–17.0)
MCH: 29.4 pg (ref 26.0–34.0)
MCHC: 32.8 g/dL (ref 30.0–36.0)
MCV: 89.7 fL (ref 80.0–100.0)
Platelets: 306 10*3/uL (ref 150–400)
RBC: 3.6 MIL/uL — ABNORMAL LOW (ref 4.22–5.81)
RDW: 14.2 % (ref 11.5–15.5)
WBC: 7.7 10*3/uL (ref 4.0–10.5)
nRBC: 0 % (ref 0.0–0.2)

## 2022-07-10 LAB — RENAL FUNCTION PANEL
Albumin: 2.4 g/dL — ABNORMAL LOW (ref 3.5–5.0)
Anion gap: 11 (ref 5–15)
BUN: 12 mg/dL (ref 8–23)
CO2: 20 mmol/L — ABNORMAL LOW (ref 22–32)
Calcium: 8.1 mg/dL — ABNORMAL LOW (ref 8.9–10.3)
Chloride: 106 mmol/L (ref 98–111)
Creatinine, Ser: 0.93 mg/dL (ref 0.61–1.24)
GFR, Estimated: >60 mL/min (ref >60)
Glucose, Bld: 102 mg/dL — ABNORMAL HIGH (ref 70–99)
Phosphorus: 2.6 mg/dL (ref 2.5–4.6)
Potassium: 3.6 mmol/L (ref 3.5–5.1)
Sodium: 137 mmol/L (ref 135–145)

## 2022-07-10 LAB — PROCALCITONIN: Procalcitonin: <0.1 ng/mL

## 2022-07-10 LAB — LACTIC ACID, PLASMA
Lactic Acid, Venous: 1.3 mmol/L (ref 0.5–1.9)
Lactic Acid, Venous: 2.3 mmol/L (ref 0.5–1.9)

## 2022-07-10 LAB — SURGICAL PATHOLOGY

## 2022-07-10 LAB — AEROBIC/ANAEROBIC CULTURE W GRAM STAIN (SURGICAL/DEEP WOUND)

## 2022-07-10 LAB — CULTURE, BLOOD (ROUTINE X 2): Culture: NO GROWTH

## 2022-07-10 MED ORDER — METOPROLOL TARTRATE 25 MG PO TABS: 12.5000 mg | ORAL_TABLET | Freq: Two times a day (BID) | ORAL | 0 refills | Status: DC

## 2022-07-10 MED ORDER — HYDROCODONE-ACETAMINOPHEN 5-325 MG PO TABS: 1.0000 | ORAL_TABLET | ORAL | 0 refills | Status: DC | PRN

## 2022-07-10 MED ORDER — CIPROFLOXACIN HCL 500 MG PO TABS: 500.0000 mg | ORAL_TABLET | Freq: Two times a day (BID) | ORAL | 0 refills | Status: AC

## 2022-07-10 MED ORDER — METOPROLOL TARTRATE 12.5 MG HALF TABLET
12.5000 mg | ORAL_TABLET | Freq: Two times a day (BID) | ORAL | Status: DC
  Administered 2022-07-10: 12.5 mg via ORAL
  Filled 2022-07-10: qty 1

## 2022-07-10 MED ORDER — HALOPERIDOL LACTATE 5 MG/ML IJ SOLN
1.0000 mg | Freq: Once | INTRAMUSCULAR | Status: AC
  Administered 2022-07-10: 1 mg via INTRAVENOUS
  Filled 2022-07-10: qty 1

## 2022-07-10 MED ORDER — LACTATED RINGERS IV BOLUS
500.0000 mL | Freq: Once | INTRAVENOUS | Status: AC
  Administered 2022-07-10: 500 mL via INTRAVENOUS

## 2022-07-10 MED ORDER — SODIUM CHLORIDE 0.9 % IV SOLN: INTRAVENOUS | Status: DC

## 2022-07-10 MED ORDER — CLOPIDOGREL BISULFATE 75 MG PO TABS: 75.0000 mg | ORAL_TABLET | Freq: Every day | ORAL | 11 refills | Status: DC

## 2022-07-10 MED ORDER — ROSUVASTATIN CALCIUM 5 MG PO TABS: 5.0000 mg | ORAL_TABLET | Freq: Every day | ORAL | 11 refills | Status: DC

## 2022-07-10 MED FILL — Fentanyl Citrate Preservative Free (PF) Inj 100 MCG/2ML: INTRAMUSCULAR | Qty: 2 | Status: AC

## 2022-07-10 MED FILL — Midazolam HCl Inj 2 MG/2ML (Base Equivalent): INTRAMUSCULAR | Qty: 2 | Status: AC

## 2022-07-10 NOTE — Plan of Care (Signed)
  Problem: Education: Goal: Knowledge of General Education information will improve Description Including pain rating scale, medication(s)/side effects and non-pharmacologic comfort measures Outcome: Progressing   

## 2022-07-10 NOTE — Progress Notes (Signed)
Orthopedic Tech Progress Note Patient Details:  Alan Meyers February 24, 1936 [id number]  Ortho Devices Type of Ortho Device: Postop shoe/boot Ortho Device/Splint Location: RLE Ortho Device/Splint Interventions: Ordered, Application   Post Interventions Patient Tolerated: Well Instructions Provided: Care of device  Donald Pore 07/10/2022, 8:51 AM

## 2022-07-10 NOTE — Progress Notes (Addendum)
  Progress Note    07/10/2022 7:55 AM 1 Day Post-Op  Subjective:  no complaints   Vitals:   07/10/22 0304 07/10/22 0505  BP: 127/77 (!) 139/59  Pulse: (!) 125 (!) 54  Resp: 20 (!) 21  Temp: 98.5 F (36.9 C) 98.6 F (37 C)  SpO2: 100% 100%   Physical Exam: Cardiac:  regular Lungs:  non labored Incisions:  Right foot dressed, staples and sutures intact, dry dressings and ACE reapplied Extremities:  RLE well perfused and warm with palpable DP; left common femoral access site without swelling or hematoma Abdomen:  soft, non distended Neurologic: alert and oriented to person  CBC    Component Value Date/Time   WBC 7.7 07/10/2022 0250   RBC 3.60 (L) 07/10/2022 0250   HGB 10.6 (L) 07/10/2022 0250   HCT 32.3 (L) 07/10/2022 0250   PLT 306 07/10/2022 0250   MCV 89.7 07/10/2022 0250   MCH 29.4 07/10/2022 0250   MCHC 32.8 07/10/2022 0250   RDW 14.2 07/10/2022 0250   LYMPHSABS 1.6 07/06/2022 1511   MONOABS 0.9 07/06/2022 1511   EOSABS 0.4 07/06/2022 1511   BASOSABS 0.1 07/06/2022 1511    BMET    Component Value Date/Time   NA 137 07/10/2022 0251   K 3.6 07/10/2022 0251   CL 106 07/10/2022 0251   CO2 20 (L) 07/10/2022 0251   GLUCOSE 102 (H) 07/10/2022 0251   BUN 12 07/10/2022 0251   CREATININE 0.93 07/10/2022 0251   CREATININE 1.00 08/18/2012 1330   CALCIUM 8.1 (L) 07/10/2022 0251   GFRNONAA >60 07/10/2022 0251   GFRAA >90 10/29/2012 0415    INR    Component Value Date/Time   INR 0.92 10/22/2012 1322     Intake/Output Summary (Last 24 hours) at 07/10/2022 0755 Last data filed at 07/10/2022 0601 Gross per 24 hour  Intake 320 ml  Output 910 ml  Net -590 ml     Assessment/Plan:  87 y.o. male is s/p 1.  Ultrasound-guided cannulation left common femoral artery 2.  Catheter selection aorta and aortogram with bilateral lower extremity 3.  Ultrasound-guided cannulation right anterior tibial artery 4.  Stent of right SFA with 2 x 6 x the  Elluvia 5.  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 Incision well appearing. Dry dressings reapplied Will need Plavix, Aspirin, Statin  Stable for discharge from vascular standpoint Will arrange outpatient follow up in 1 month with ABI and RLE arterial duplex  Graceann Congress, PA-C [facility] [phone] 07/10/2022  I have independently interviewed and examined patient and agree with PA assessment and plan above.  Well-perfused right lower extremity status post SFA and popliteal stenting.  Plan for follow-up in 1 month as noted above continue wound care at podiatry direction.  Jahmia Berrett C. Randie Heinz, MD [facility] [place] Office: [phone] Pager: [phone]

## 2022-07-10 NOTE — Progress Notes (Signed)
  Subjective:  Patient ID: Alan Meyers, male    DOB: 1935-10-03,  MRN: [id number]  POD#3 right partial first ray amputation  Negative for chest pain and shortness of breath. Denies pain. Review of all other systems is negative Objective:   Vitals:   07/10/22 0304 07/10/22 0505  BP: 127/77 (!) 139/59  Pulse: (!) 125 (!) 54  Resp: 20 (!) 21  Temp: 98.5 F (36.9 C) 98.6 F (37 C)  SpO2: 100% 100%   General AA&O x3. Normal mood and affect.   Vascular Non palpable pulses. Foot is warm  Neurologic Epicritic sensation grossly reduced  Dermatologic Incision well coapted. Skin bridges perfusing. No signs of infection   Orthopedic: MMT 5/5 in dorsiflexion, plantarflexion, inversion, and eversion. Normal joint ROM without pain or crepitus.    Assessment & Plan:  Patient was evaluated and treated and all questions answered.  POD#3 partial ray amputation right first -Healing well with no necrosis or infection at amp site -S/p angio per vascular, appreciate  -Dressing changed today. Now to remain intact until follow up in office -F/u w/ Dr. Lilian Kapur later this week or early next Office will schedule -WBAT with weight to heel in post op shoe - Augmentin x 14 days post op - Pt stable for discharge from podiatry standpoint  Pilar Plate, DPM  Accessible via secure chat for questions or concerns.

## 2022-07-10 NOTE — Discharge Instructions (Signed)
  [facility] [place]  Discharge Instructions  Lower Extremity Angiogram; Angioplasty/Stenting  Please refer to the following instructions for your post-procedure care. Your surgeon or physician assistant will discuss any changes with you.  Activity  Avoid lifting more than 8 pounds (1 gallons of milk) for 5 days after your procedure. You may walk as much as you can tolerate. It's OK to drive after 72 hours.  Bathing/Showering  You may shower the day after your procedure. If you have a bandage, you may remove it at 24- 48 hours. Clean your incision site with mild soap and water. Pat the area dry with a clean towel.  Diet  Resume your pre-procedure diet. There are no special food restrictions following this procedure. All patients with peripheral vascular disease should follow a low fat/low cholesterol diet. In order to heal from your surgery, it is CRITICAL to get adequate nutrition. Your body requires vitamins, minerals, and protein. Vegetables are the best source of vitamins and minerals. Vegetables also provide the perfect balance of protein. Processed food has little nutritional value, so try to avoid this.  Medications  Resume taking all of your medications unless your doctor tells you not to. If your incision is causing pain, you may take over-the-counter pain relievers such as acetaminophen (Tylenol)  Follow Up  Follow up will be arranged at the time of your procedure. You may have an office visit scheduled or may be scheduled for surgery. Ask your surgeon if you have any questions.  Please call us immediately for any of the following conditions: .Severe or worsening pain your legs or feet at rest or with walking. .Increased pain, redness, drainage at your groin puncture site. .Fever of 101 degrees or higher. .If you have any mild or slow bleeding from your puncture site: lie down, apply firm constant pressure over the area with a piece of gauze or a  clean wash cloth for 30 minutes- no peeking!, call 911 right away if you are still bleeding after 30 minutes, or if the bleeding is heavy and unmanageable.  Reduce your risk factors of vascular disease:  . Stop smoking. If you would like help call [facility] at 1-800-QUIT-NOW (1-800-784-8669) or [place] at 336-586-4000. . Manage your cholesterol . Maintain a desired weight . Control your diabetes . Keep your blood pressure down .  If you have any questions, please call the office at [phone] 

## 2022-07-10 NOTE — Plan of Care (Signed)
  Problem: Education: Goal: Knowledge of General Education information will improve Description: Including pain rating scale, medication(s)/side effects and non-pharmacologic comfort measures Outcome: Adequate for Discharge   Problem: Health Behavior/Discharge Planning: Goal: Ability to manage health-related needs will improve Outcome: Adequate for Discharge   Problem: Clinical Measurements: Goal: Ability to maintain clinical measurements within normal limits will improve Outcome: Adequate for Discharge Goal: Will remain free from infection Outcome: Adequate for Discharge Goal: Diagnostic test results will improve Outcome: Adequate for Discharge Goal: Respiratory complications will improve Outcome: Adequate for Discharge Goal: Cardiovascular complication will be avoided Outcome: Adequate for Discharge   Problem: Activity: Goal: Risk for activity intolerance will decrease Outcome: Adequate for Discharge   Problem: Nutrition: Goal: Adequate nutrition will be maintained Outcome: Adequate for Discharge   Problem: Coping: Goal: Level of anxiety will decrease Outcome: Adequate for Discharge   Problem: Elimination: Goal: Will not experience complications related to bowel motility Outcome: Adequate for Discharge Goal: Will not experience complications related to urinary retention Outcome: Adequate for Discharge   Problem: Pain Managment: Goal: General experience of comfort will improve Outcome: Adequate for Discharge   Problem: Safety: Goal: Ability to remain free from injury will improve Outcome: Adequate for Discharge   Problem: Skin Integrity: Goal: Risk for impaired skin integrity will decrease Outcome: Adequate for Discharge   Problem: Acute Rehab PT Goals(only PT should resolve) Goal: Pt Will Go Supine/Side To Sit Outcome: Adequate for Discharge Goal: Pt Will Go Sit To Supine/Side Outcome: Adequate for Discharge Goal: Patient Will Transfer Sit To/From  Stand Outcome: Adequate for Discharge Goal: Pt Will Ambulate Outcome: Adequate for Discharge Goal: Pt Will Verbalize and Adhere to Precautions While Description: PT Will Verbalize and Adhere to Precautions While Performing Mobility Outcome: Adequate for Discharge   Problem: Education: Goal: Understanding of CV disease, CV risk reduction, and recovery process will improve Outcome: Adequate for Discharge Goal: Individualized Educational Video(s) Outcome: Adequate for Discharge   Problem: Activity: Goal: Ability to return to baseline activity level will improve Outcome: Adequate for Discharge   Problem: Cardiovascular: Goal: Ability to achieve and maintain adequate cardiovascular perfusion will improve Outcome: Adequate for Discharge Goal: Vascular access site(s) Level 0-1 will be maintained Outcome: Adequate for Discharge   Problem: Health Behavior/Discharge Planning: Goal: Ability to safely manage health-related needs after discharge will improve Outcome: Adequate for Discharge

## 2022-07-10 NOTE — Discharge Summary (Signed)
Physician Discharge Summary  Alan Meyers:[id number] DOB: 1935/12/07 DOA: 07/06/2022  PCP: Alan Potts, MD  Admit date: 07/06/2022 Discharge date: 07/10/2022  Time spent: 33 minutes  Recommendations for Outpatient Follow-up:  Needs CBC, Chem-7, close outpatient follow-up with MD 1 week CC Dr. Ninetta Lights of podiatry with regards to wound on leg CC Dr. Pascal Lux for outpatient follow-up as needed as is status post stent to SFA popliteal artery on right side Antibiotics Cipro and pain meds called into pharmacy [facility] as per podiatry Needs Mini-Mental and MoCA testing in the outpatient setting once he settles back down at home of family  Discharge Diagnoses:  MAIN problem for hospitalization   Osteomyelitis right foot status post surgery Prior stroke with multi-infarct dementia   Please see below for itemized issues addressed in HO[facility]- refer to other progress notes for clarity if needed  Discharge Condition: Improved  Diet recommendation: Regular  Filed Weights   07/06/22 1444 07/08/22 0500  Weight: 57.2 kg 58.3 kg    History of present illness:  87 year old white male home dwelling Character to artery stenosis status post L CEA 10/2012 + CVA HTN, HLD Prior smoker, H. 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 Lilian Kapur 5/13 right lower extremity arteriogram per [facility] Course:  Osteomyelitis right foot status post ray amputation---cultures grew rare Pseudomonas  -Cefepime/Vanco/metronidazole-->Augmentin and then Cipro when cultures finalized  Did have clear  margins as per my discussion verbally with Dr. Lilian Kapur and as per discussion with infectious disease Dr. Ilsa Iha Was on fluids during his hospital stay mainly because he underwent angiogram Prescribed pain control and graduated fashion Tylenol Norco etc. needs outpatient close follow-up with podiatry  Sinus tach on 5/14, lactic acid 2.4--sepsis ruled out from my perspective [already on antibiotics] Patient developed sinus tach likely secondary to some agitation and this seemed to not be infectious He looked completely fine had no fevers chills and the wound exam is as below per Dr. Annamary Rummage who saw the foot prior to discharge I think that the sinus tach was because of the patient's anxiety surrounding being awake all night and as such we will put him on a low-dose metoprolol 12.5 twice daily and let him go home   Stroke, L CEA 2014 prior smoker Continue aspirin 81   HTN HLD not on meds from prior to admission? Follow trends of blood pressure-do not treat   Severe dementia previously on Mini-Mental testing Previously on Aricept-n needs outpatient discussion about Aricept/Namenda I felt it was reasonable for him to go home as if he stays in the hospital just for observation he will probably become more confused and delirious after all the anesthesia and changes of 1 floor to another 85 reassessed the patient this afternoon after seeing him earlier this morning and he looks about the same and his heart ratei s better than it was previously   Procedures: Procedure Performed: 1.  Ultrasound-guided cannulation left common femoral artery 2.  Catheter selection aorta and aortogram with bilateral lower extremity 3.  Ultrasound-guided cannulation right anterior tibial artery 4.  Stent of right SFA with 2 x 6 x the Elluvia 5.  Stent of right popliteal artery with  6 x 40 mm Eluvia  Discharge Exam: Vitals:   07/10/22 0803 07/10/22 1148  BP: 105/70 106/64  Pulse: (!) 114 98  Resp:  17 (!) 21  Temp: 98 F (36.7 C) 97.6 F (36.4 C)  SpO2:      Subj on day of d/c   Slightly delirious but more oriented sitting in chair He is able to orient some but is confused  General Exam on discharge  EOMI NCAT no focal deficit Chest is clear no wheeze S1-S2 no murmur Abdomen soft Wound not examined today-per exam as per podiatrist below   Discharge Instructions   Discharge Instructions     Diet - low sodium heart healthy   Complete by: As directed    Discharge instructions   Complete by: As directed    Keep dressing in place until time of review by podiatrist Dr. Lilian Kapur can weight-bear as per his instructions but keep the post op boot always on the foot. It would be a good idea to continue pain meds as you really need them-use Tylenol as first choice for pain >6/10 would use opiates which we have prescribed for limited amount of time I would recommend continuing aspirin Plavix atorvastatin that was started this admission since you had a procedure done on your lower extremity which opened up some arteries and blood flow to the wound on the leg You had a slightly fast heart rate during hospital stay probably from pain and anxiety we started metoprolol which is a blood pressure medicine but which helps the heart rate-your primary physician should be able to tell you whether you should continue this on follow-up in the outpatient setting For high fevers chills nausea vomiting and or bleeding and pus from the wound you need to follow-up at the emergency room but is very unlikely and I hope that you do well   Increase activity slowly   Complete by: As directed    No wound care   Complete by: As directed       Allergies as of 07/10/2022   No Known Allergies      Medication List     STOP taking these medications    Centrum Silver 50+Men Tabs   cephALEXin 500 MG capsule Commonly known as: KEFLEX   dicyclomine 10 MG capsule Commonly known as: BENTYL    doxycycline 100 MG tablet Commonly known as: VIBRA-TABS       TAKE these medications    aspirin 81 MG tablet Take 81 mg by mouth daily.   ciprofloxacin 500 MG tablet Commonly known as: Cipro Take 1 tablet (500 mg total) by mouth 2 (two) times daily for 7 days.   clopidogrel 75 MG tablet Commonly known as: PLAVIX Take 1 tablet (75 mg total) by mouth daily. Start taking on: Jul 11, 2022   HYDROcodone-acetaminophen 5-325 MG tablet Commonly known as: NORCO/VICODIN Take 1-2 tablets by mouth every 4 (four) hours as needed (moderate to severe pain).   metoprolol tartrate 25 MG tablet Commonly known as: LOPRESSOR Take 0.5 tablets (12.5 mg total) by mouth 2 (two) times daily.   rosuvastatin 5 MG tablet Commonly known as: CRESTOR Take 1 tablet (5 mg total) by mouth daily. Start taking on: Jul 11, 2022   VITAMIN B-12 PO Take 1 tablet by mouth daily.   VITAMIN D-3 PO Take 1 capsule by mouth daily.               Durable Medical Equipment  (From admission, onward)  Start     Ordered   07/09/22 1633  For home use only DME Walker rolling  Once       Question Answer Comment  Walker: With 5 Inch Wheels   Patient needs a walker to treat with the following condition Weakness      07/09/22 1632           No Known Allergies  Follow-up Information     [facility] Follow up.   Specialty: Home Health Services Contact information: [address] [place] [address] [place] [address] [phone]         [facility] Follow up in 1 month(s).   Why: The office will call the patient with an appointment Contact information: [address] [place] [address] [phone]                 The results of significant diagnostics from this hospitalization (including imaging, microbiology, ancillary and laboratory) are listed below for reference.    Significant Diagnostic Studies: PERIPHERAL VASCULAR  CATHETERIZATION  Result Date: 07/09/2022 Images from the original result were not included. Patient name: Alan Meyers MRN: [id number] DOB: [birth year] Sex: male 07/09/2022 Pre-operative Diagnosis: Chronic right lower extremity limb threatening ischemia status post toe amputation Post-operative diagnosis:  Same Surgeon:  Luanna Salk. Randie Heinz, MD Procedure Performed: 1.  Ultrasound-guided cannulation left common femoral artery 2.  Catheter selection aorta and aortogram with bilateral lower extremity 3.  Ultrasound-guided cannulation right anterior tibial artery 4.  Stent of right SFA with 2 x 6 x the Elluvia 5.  Stent of right popliteal artery with 6 x 40 mm Eluvia Indications: 87 year old male with history of endovascular aneurysm repair as well as carotid repair in the past.  He now is status post right transmetatarsal first toe amputation with mildly depressed ABIs and monophasic flow by duplex.  He is indicated for angiography with possible intervention. Findings: There is an endograft in the aorta in the infrarenal position there are no endoleak's identified the limbs are crossed there is brisk flow in the bilateral external iliac arteries with common femoral artery is also patent.  On the left side the SFA is heavily diseased as is the popliteal appears to have inline flow although severe disease throughout via the peroneal artery.  On the right the SFA is very diminutive heavily diseased proximally and then occludes for the long segment and reconstitutes above the knee and at the knee there is 60% stenosis and distally there is brisk flow via the peroneal artery and the anterior tibial artery is proximally occluded and reconstitutes with flow to the foot.  We were able to cannulate the anterior tibial artery and then retrograde treat the SFA with stenting of the popliteal artery also with stenting and the intertibial artery was then patent with a pressure of 145 mmHg at the ankle and a palpable dorsalis  pedis at conclusion.  Procedure:  The patient was identified in the holding area and taken to room 8.  The patient was then placed supine on the table and prepped and draped in the usual sterile fashion.  A time out was called.  Ultrasound was used to evaluate the left common femoral artery which was patent and very large and patulous.  The left common femoral artery was cannulated with micropuncture needle followed by wire and the sheath and images saved to the permanent record.  We placed a Bentson wire followed by a 5 [place] sheath and Omni catheter  to the level of L1 and performed aortogram.  We then performed bilateral lower extremity runoff from the flow divider of the core endovascular aneurysm repair device.  With the above findings we elected for treatment of the right lower extremity.  Right lower extremity was prepped and draped at the level of the ankle.  The anterior tibial artery was easily identified and was patent and the area was anesthetized with 1% lidocaine and cannulated with a micropuncture needle followed by wire and sheath.  Patient was heparinized at this time.  Unfortunately manipulation of left sided catheter and the sheath became dislodged and pressure was held for 45 minutes as the patient was heparinized and hemostasis was noted to be obtained and the patient was hemodynamically stable throughout the case.  On the right side we then used a V18 wire and quick cross catheter we able to cross all the way through the occluded anterior tibial artery then the stenosed popliteal artery and then the occluded SFA up to the common femoral artery with true luminal and confirmed intraluminal access.  We exchanged for a 5 French slender sheath and then placed an 035 wire and then ballooned the entirety of the SFA with a 5 mm balloon.  We then placed a long 6 French slender sheath and then primarily stented the SFA with 2 drug-eluting stents that were 150 mm in length.  These were postdilated with 5  mm balloons and noted to have no residual stenosis.  The sheath was then retracted and retrograde angiography demonstrated high-grade stenosis of the popliteal artery which was primarily stented with a 40 mm drug-eluting stent and postdilated again with a 5 mm balloon.  We then evaluated the anterior tibial artery and this had been daughtered and noted to be patent.  The sheath was retracted all the way to the distal anterior tibial artery and the wire was removed and there was a systolic pressure of 145 mmHg.  Satisfied with this the sheath was then removed and pressure held until hemostasis obtained.  He tolerated the procedure without immediate complication. Contrast: 140cc Brandon C. Randie Heinz, MD [facility] Office: [phone] Pager: [phone]   VAS [place] ABI WITH/WO TBI  Result Date: 07/09/2022  LOWER EXTREMITY DOPPLER STUDY Patient Name:  PRESTIN KUPERMAN  Date of Exam:   07/08/2022 Medical Rec #: [id number]     Accession #:    [id number] Date of Birth: 1936-02-15    Patient Gender: M Patient Age:   28 years Exam Location:  [facility] Procedure:      VAS [place] ABI WITH/WO TBI Referring Phys: ADAM MCDONALD --------------------------------------------------------------------------------  Indications: RLE foot infection S/P ray amputation High Risk         Hypertension, hyperlipidemia, past history of smoking, prior Factors:          CVA.  Vascular Interventions: Left CEA 2014, AAA repair/graft 2014. Limitations: Today's exam was limited due to involuntary patient movement. Comparison Study: No previous exams Performing Technologist: Hill, Jody RVT, RDMS  Examination Guidelines: A complete evaluation includes at minimum, Doppler waveform signals and systolic blood pressure reading at the level of bilateral brachial, anterior tibial, and posterior tibial arteries, when vessel segments are accessible. Bilateral testing is considered an integral part of a complete examination.  Photoelectric Plethysmograph (PPG) waveforms and toe systolic pressure readings are included as required and additional duplex testing as needed. Limited examinations for reoccurring indications may be performed as noted.  ABI Findings: +---------+------------------+-----+----------+----------+ Right  Rt Pressure (mmHg)IndexWaveform  Comment    +---------+------------------+-----+----------+----------+ Brachial 167                    triphasic            +---------+------------------+-----+----------+----------+ PTA      107               0.63 monophasic           +---------+------------------+-----+----------+----------+ DP       98                0.58 monophasic           +---------+------------------+-----+----------+----------+ Great Toe                                 Amputation +---------+------------------+-----+----------+----------+ +---------+------------------+-----+----------+-------+ Left     Lt Pressure (mmHg)IndexWaveform  Comment +---------+------------------+-----+----------+-------+ Brachial 170                    triphasic         +---------+------------------+-----+----------+-------+ PTA      119               0.70 monophasic        +---------+------------------+-----+----------+-------+ DP       109               0.64 monophasic        +---------+------------------+-----+----------+-------+ Great Toe73                0.43 Abnormal          +---------+------------------+-----+----------+-------+ +-------+-----------+-----------+------------+------------+ ABI/TBIToday's ABIToday's TBIPrevious ABIPrevious TBI +-------+-----------+-----------+------------+------------+ Right  0.63                                           +-------+-----------+-----------+------------+------------+ Left   0.70       0.43                                +-------+-----------+-----------+------------+------------+  Summary: Right: Resting  right ankle-brachial index indicates moderate right lower extremity arterial disease. Left: Resting left ankle-brachial index indicates moderate left lower extremity arterial disease. The left toe-brachial index is abnormal. *See table(s) above for measurements and observations.  Electronically signed by Sherald Hess MD on 07/09/2022 at 1:43:55 PM.    Final    VAS [place] LOWER EXTREMITY ARTERIAL DUPLEX  Result Date: 07/09/2022 LOWER EXTREMITY ARTERIAL DUPLEX STUDY Patient Name:  KYU CARUTH  Date of Exam:   07/08/2022 Medical Rec #: [id number]     Accession #:    [id number] Date of Birth: 07-Dec-1935    Patient Gender: M Patient Age:   13 years Exam Location:  [facility] Procedure:      VAS [place] LOWER EXTREMITY ARTERIAL DUPLEX Referring Phys: ADAM MCDONALD --------------------------------------------------------------------------------  Indications: Peripheral artery disease, and S/P ray amputation to RLE. High Risk         Hypertension, hyperlipidemia, past history of smoking, prior Factors:          CVA.  Vascular Interventions: Left CEA (2014), AAA repair/graft (2014). Current ABI:            0.63 / 0.70 Limitations: Patient continuously moving throughout exam, poor positioning,  heavy calcific shadowing. Comparison Study: No previous exams Performing Technologist: Jody Hill RVT, RDMS  Examination Guidelines: A complete evaluation includes B-mode imaging, spectral Doppler, color Doppler, and power Doppler as needed of all accessible portions of each vessel. Bilateral testing is considered an integral part of a complete examination. Limited examinations for reoccurring indications may be performed as noted.  +-----------+-------+-----+-------------+---------------+---------------------+ RIGHT      PSV    RatioStenosis     Waveform       Comments                         cm/s                                                           +-----------+-------+-----+-------------+---------------+---------------------+ EIA Mid    110                      biphasic                             +-----------+-------+-----+-------------+---------------+---------------------+ EIA Distal 145                      biphasic                             +-----------+-------+-----+-------------+---------------+---------------------+ CFA Prox   105                      audibly                                                                  biphasic                             +-----------+-------+-----+-------------+---------------+---------------------+ CFA Mid    96                       audibly                                                                  biphasic                             +-----------+-------+-----+-------------+---------------+---------------------+ CFA Distal 125         30-49%       monophasic     turbulent                                    stenosis                                          +-----------+-------+-----+-------------+---------------+---------------------+  DFA        210         50-74%       monophasic     turbulent                                    stenosis                                          +-----------+-------+-----+-------------+---------------+---------------------+ SFA Prox   43                       monophasic                           +-----------+-------+-----+-------------+---------------+---------------------+ SFA Mid    220         50-74%       monophasic     short segment                                stenosis                    occlusion             +-----------+-------+-----+-------------+---------------+---------------------+ SFA Distal 24                       monophasic                           +-----------+-------+-----+-------------+---------------+---------------------+ POP Prox   26                        monophasic                           +-----------+-------+-----+-------------+---------------+---------------------+ POP Mid    45                       monophasic                           +-----------+-------+-----+-------------+---------------+---------------------+ POP Distal 210         50-74%       monophasic                                                  stenosis                                          +-----------+-------+-----+-------------+---------------+---------------------+ TP Trunk   51                       monophasic                           +-----------+-------+-----+-------------+---------------+---------------------+ ATA Prox   32  monophasic                           +-----------+-------+-----+-------------+---------------+---------------------+ ATA Mid    35                       monophasic                           +-----------+-------+-----+-------------+---------------+---------------------+ ATA Distal 62                       monophasic                           +-----------+-------+-----+-------------+---------------+---------------------+ PTA Prox   43                       monophasic                           +-----------+-------+-----+-------------+---------------+---------------------+ PTA Mid    39                       monophasic                           +-----------+-------+-----+-------------+---------------+---------------------+ PTA Distal 45                       monophasic                           +-----------+-------+-----+-------------+---------------+---------------------+ PERO Prox  60                       monophasic                           +-----------+-------+-----+-------------+---------------+---------------------+ PERO Mid   47                       monophasic                            +-----------+-------+-----+-------------+---------------+---------------------+ PERO Distal70                       monophasic                           +-----------+-------+-----+-------------+---------------+---------------------+  Summary: Right: 30-49% stenosis noted in the distal common femoral artery. There is a 50-74% stenosis in the proximal PFA. 50-74% stenosis noted in the mid superficial femoral artery. There is a short segment occlusion of mid/distal SFA that is reconstituted through collaterals. 50-74% stenosis noted in the distal popliteal artery. Severity of stenosis may be underestimated due to heavy calcific shadowing. Presence of collateral vessels seen throughout RLE.  See table(s) above for measurements and observations. Electronically signed by Sherald Hess MD on 07/09/2022 at 1:43:43 PM.    Final    DG Foot Complete Right  Result Date: 06/19/2022 CLINICAL DATA:  Plantar wound.  Swelling EXAM: RIGHT FOOT COMPLETE - 3 VIEW COMPARISON:  None Available. FINDINGS: Small well corticated plantar calcaneal spur. No fracture or dislocation. Preserved bone mineralization. No  underlying bony erosive changes at this time. Please correlate for the exact location of the wound. If there is further concern of bone or soft tissue infection, cross-sectional imaging can be considered as clinically appropriate IMPRESSION: Calcaneal spur.  No acute osseous abnormality Electronically Signed   By: Karen Kays M.D.   On: 06/19/2022 13:46   US Venous Img Lower Unilateral Right  Result Date: 06/19/2022 CLINICAL DATA:  Right lower extremity pain and edema. Former smoker. Evaluate for DVT. EXAM: RIGHT LOWER EXTREMITY VENOUS DOPPLER ULTRASOUND TECHNIQUE: Gray-scale sonography with graded compression, as well as color Doppler and duplex ultrasound were performed to evaluate the lower extremity deep venous systems from the level of the common femoral vein and including the common femoral, femoral,  profunda femoral, popliteal and calf veins including the posterior tibial, peroneal and gastrocnemius veins when visible. The superficial great saphenous vein was also interrogated. Spectral Doppler was utilized to evaluate flow at rest and with distal augmentation maneuvers in the common femoral, femoral and popliteal veins. COMPARISON:  None Available. FINDINGS: Contralateral Common Femoral Vein: Respiratory phasicity is normal and symmetric with the symptomatic side. No evidence of thrombus. Normal compressibility. Common Femoral Vein: No evidence of thrombus. Normal compressibility, respiratory phasicity and response to augmentation. Saphenofemoral Junction: No evidence of thrombus. Normal compressibility and flow on color Doppler imaging. Profunda Femoral Vein: No evidence of thrombus. Normal compressibility and flow on color Doppler imaging. Femoral Vein: No evidence of thrombus. Normal compressibility, respiratory phasicity and response to augmentation. Popliteal Vein: No evidence of thrombus. Normal compressibility, respiratory phasicity and response to augmentation. Calf Veins: No evidence of thrombus. Normal compressibility and flow on color Doppler imaging. Superficial Great Saphenous Vein: No evidence of thrombus. Normal compressibility. Other Findings: There is a minimal amount of eccentric echogenic plaque seen within the both the right and left common femoral arteries. Scattered echogenic plaque is seen throughout the right superficial femoral and popliteal arteries. Note is made of a non pathologically enlarged right inguinal lymph node which measures 0.7 cm in greatest short axis diameter, presumably reactive in etiology. IMPRESSION: No evidence of DVT within the right lower extremity. Electronically Signed   By: Simonne Come M.D.   On: 06/19/2022 10:27    Microbiology: Recent Results (from the past 240 hour(s))  Blood Cultures x 2 sites     Status: None (Preliminary result)   Collection Time:  07/06/22  2:28 PM   Specimen: BLOOD  Result Value Ref Range Status   Specimen Description BLOOD SITE NOT SPECIFIED  Final   Special Requests   Final    BOTTLES DRAWN AEROBIC AND ANAEROBIC Blood Culture adequate volume   Culture   Final    NO GROWTH 4 DAYS Performed at [facility], [address], [place], [place] [address]    Report Status PENDING  Incomplete  Blood Cultures x 2 sites     Status: None (Preliminary result)   Collection Time: 07/06/22  3:42 PM   Specimen: BLOOD RIGHT ARM  Result Value Ref Range Status   Specimen Description BLOOD RIGHT ARM  Final   Special Requests   Final    BOTTLES DRAWN AEROBIC AND ANAEROBIC Blood Culture results may not be optimal due to an inadequate volume of blood received in culture bottles   Culture   Final    NO GROWTH 4 DAYS Performed at [facility], [address], [place], [place] [address]    Report Status PENDING  Incomplete  Surgical PCR  screen     Status: None   Collection Time: 07/06/22 10:08 PM   Specimen: Nasal Mucosa; Nasal Swab  Result Value Ref Range Status   MRSA, PCR NEGATIVE NEGATIVE Final   Staphylococcus aureus NEGATIVE NEGATIVE Final    Comment: (NOTE) The Xpert SA Assay (FDA approved for NASAL specimens in patients 39 years of age and older), is one component of a comprehensive surveillance program. It is not intended to diagnose infection nor to guide or monitor treatment. Performed at [facility], [address], [place], [place] [address]   Aerobic/Anaerobic Culture w Gram Stain (surgical/deep wound)     Status: None (Preliminary result)   Collection Time: 07/07/22 11:16 AM   Specimen: Wound  Result Value Ref Range Status   Specimen Description WOUND  Final   Special Requests RIGHT FOOT  Final   Gram Stain   Final    RARE WBC PRESENT, PREDOMINANTLY PMN NO ORGANISMS SEEN Performed at [facility], [address], [place], [place] [address]    Culture   Final    FEW PSEUDOMONAS  AERUGINOSA WITHIN MIXED ORGANISMS NO ANAEROBES ISOLATED; CULTURE IN PROGRESS FOR 5 DAYS    Report Status PENDING  Incomplete  Aerobic/Anaerobic Culture w Gram Stain (surgical/deep wound)     Status: None (Preliminary result)   Collection Time: 07/07/22 11:18 AM   Specimen: Soft Tissue, Other  Result Value Ref Range Status   Specimen Description TISSUE  Final   Special Requests 1ST METATARSAL  Final   Gram Stain   Final    NO WBC SEEN NO ORGANISMS SEEN Performed at [facility], [address], [place], [place] [address]    Culture   Final    RARE PSEUDOMONAS AERUGINOSA NO ANAEROBES ISOLATED; CULTURE IN PROGRESS FOR 5 DAYS    Report Status PENDING  Incomplete   Organism ID, Bacteria PSEUDOMONAS AERUGINOSA  Final      Susceptibility   Pseudomonas aeruginosa - MIC*    CEFTAZIDIME 4 SENSITIVE Sensitive     CIPROFLOXACIN <=0.25 SENSITIVE Sensitive     GENTAMICIN <=1 SENSITIVE Sensitive     IMIPENEM 1 SENSITIVE Sensitive     PIP/TAZO 16 SENSITIVE Sensitive     CEFEPIME 2 SENSITIVE Sensitive     * RARE PSEUDOMONAS AERUGINOSA     Labs: Basic Metabolic Panel: Recent Labs  Lab 07/06/22 1511 07/07/22 0034 07/09/22 0043 07/10/22 0251  NA 138 136 135 137  K 4.7 3.8 4.1 3.6  CL 105 106 106 106  CO2 25 23 18* 20*  GLUCOSE 103* 91 103* 102*  BUN 15 10 15 12   CREATININE 1.11 1.04 1.12 0.93  CALCIUM 8.7* 8.1* 8.2* 8.1*  PHOS  --   --  3.4 2.6   Liver Function Tests: Recent Labs  Lab 07/06/22 1511 07/07/22 0034 07/09/22 0043 07/10/22 0251  AST 20 16  --   --   ALT 15 13  --   --   ALKPHOS 77 58  --   --   BILITOT 0.8 0.8  --   --   PROT 7.2 5.6*  --   --   ALBUMIN 3.0* 2.3* 2.4* 2.4*   No results for input(s): "LIPASE", "AMYLASE" in the last 168 hours. No results for input(s): "AMMONIA" in the last 168 hours. CBC: Recent Labs  Lab 07/06/22 1511 07/07/22 0034 07/09/22 0446 07/10/22 0250  WBC 9.8 6.4 7.8 7.7  NEUTROABS 6.8  --   --   --  HGB 11.7* 9.6*  10.0* 10.6*  HCT 36.8* 29.5* 29.7* 32.3*  MCV 93.4 92.2 89.7 89.7  PLT 369 278 301 306   Cardiac Enzymes: No results for input(s): "CKTOTAL", "CKMB", "CKMBINDEX", "TROPONINI" in the last 168 hours. BNP: BNP (last 3 results) Recent Labs    06/19/22 1906  BNP 487.0*    ProBNP (last 3 results) No results for input(s): "PROBNP" in the last 8760 hours.  CBG: No results for input(s): "GLUCAP" in the last 168 hours.     Signed:  Rhetta Mura MD   [facility] 07/10/2022, 2:29 PM

## 2022-07-10 NOTE — Progress Notes (Signed)
Physical Therapy Treatment Patient Details Name: Alan Meyers MRN: [id number] DOB: Jul 12, 1935 Today's Date: 07/10/2022   History of Present Illness Admitted with R foot first metatarsal ulcer, plantar surface of foot; s/p OR for partial first ray resection, WB through heel only in postop shoe. Pt underwent stenting of RLE on 5/13.   PMH - AAA (abdominal aortic aneurysm) (HCC), Anemia, Dementia (HCC), Diarrhea, functional (08/01/2012), Headache(784.0), Hemorrhoids, Hyperlipidemia, Hypertension, Memory loss, Peripheral arterial disease (HCC), Peripheral neuropathy, Plantar warts (05/29/2012), and Pulsatile abdominal mass (08/01/2012).    PT Comments    Pt with improving mobility. Due to pt's cognition he has difficulty using walker correctly and doesn't understand keeping weight on rt heel. Needs frequent cues and assist for use of walker. Family plans to take home.    Recommendations for follow up therapy are one component of a multi-disciplinary discharge planning process, led by the attending physician.  Recommendations may be updated based on patient status, additional functional criteria and insurance authorization.  Follow Up Recommendations       Assistance Recommended at Discharge Frequent or constant Supervision/Assistance  Patient can return home with the following A little help with walking and/or transfers;A little help with bathing/dressing/bathroom;Assistance with cooking/housework;Assist for transportation;Help with stairs or ramp for entrance   Equipment Recommendations  Rolling walker (2 wheels)    Recommendations for Other Services       Precautions / Restrictions Precautions Precautions: Fall;Other (comment) Precaution Comments: Weight bearing through R heel in postop shoe Required Braces or Orthoses: Other Brace Other Brace: post op shoe Restrictions Weight Bearing Restrictions: Yes RLE Weight Bearing:  (WB through heel in postop shoe) Other Position/Activity  Restrictions: Weight bearing on heel in postop shoe RLE     Mobility  Bed Mobility Overal bed mobility: Needs Assistance Bed Mobility: Supine to Sit     Supine to sit: Min assist     General bed mobility comments: Assist to elevate trunk into sitting.    Transfers Overall transfer level: Needs assistance Equipment used: Rolling walker (2 wheels) Transfers: Sit to/from Stand, Bed to chair/wheelchair/BSC Sit to Stand: Min assist           General transfer comment: Assist for balance.    Ambulation/Gait Ambulation/Gait assistance: Min assist Gait Distance (Feet): 75 Feet Assistive device: Rolling walker (2 wheels) Gait Pattern/deviations: Step-through pattern, Decreased stride length Gait velocity: decr Gait velocity interpretation: <1.8 ft/sec, indicate of risk for recurrent falls   General Gait Details: Assist to manage walker with verbal/tactile cues to try and get pt to stay closer to walker. Pt unable to understand weight bearing on heel.   Stairs             Wheelchair Mobility    Modified Rankin (Stroke Patients Only)       Balance Overall balance assessment: Mild deficits observed, not formally tested                                          Cognition Arousal/Alertness: Awake/alert Behavior During Therapy: WFL for tasks assessed/performed Overall Cognitive Status: No family/caregiver present to determine baseline cognitive functioning                                 General Comments: Pt pleaseantly confused. Disoriented to time, place, situation. Difficulty following directions for use of walker. Does  not understand keeping weight on heel.        Exercises      General Comments        Pertinent Vitals/Pain Pain Assessment Pain Assessment: Faces Faces Pain Scale: No hurt    Home Living                          Prior Function            PT Goals (current goals can now be found in the  care plan section) Acute Rehab PT Goals Patient Stated Goal: Unable to state Progress towards PT goals: Progressing toward goals    Frequency    Min 1X/week      PT Plan Discharge plan needs to be updated;Frequency needs to be updated    Co-evaluation              AM-PAC PT "6 Clicks" Mobility   Outcome Measure  Help needed turning from your back to your side while in a flat bed without using bedrails?: None Help needed moving from lying on your back to sitting on the side of a flat bed without using bedrails?: A Little Help needed moving to and from a bed to a chair (including a wheelchair)?: A Little Help needed standing up from a chair using your arms (e.g., wheelchair or bedside chair)?: A Little Help needed to walk in hospital room?: A Little Help needed climbing 3-5 steps with a railing? : A Little 6 Click Score: 19    End of Session Equipment Utilized During Treatment: Gait belt;Other (comment) (post-op shoe) Activity Tolerance: Patient tolerated treatment well Patient left: in chair;with call bell/phone within reach;with chair alarm set Nurse Communication: Mobility status PT Visit Diagnosis: Unsteadiness on feet (R26.81);Other abnormalities of gait and mobility (R26.89);Muscle weakness (generalized) (M62.81)     Time: 1610-9604 PT Time Calculation (min) (ACUTE ONLY): 29 min  Charges:  $Gait Training: 23-37 mins                     [facility] PT Acute Rehabilitation Services Office [phone]    Angelina Ok [facility] [place] 07/10/2022, 10:36 AM

## 2022-07-10 NOTE — Progress Notes (Signed)
Discharge instructions (including medications) discussed with and copy provided to patient/caregiver 

## 2022-07-11 ENCOUNTER — Ambulatory Visit (HOSPITAL_COMMUNITY): Payer: Medicare Other | Admitting: Physical Therapy

## 2022-07-11 LAB — CULTURE, BLOOD (ROUTINE X 2)

## 2022-07-12 DIAGNOSIS — D500 Iron deficiency anemia secondary to blood loss (chronic): Secondary | ICD-10-CM | POA: Diagnosis not present

## 2022-07-12 DIAGNOSIS — Z89431 Acquired absence of right foot: Secondary | ICD-10-CM | POA: Diagnosis not present

## 2022-07-12 DIAGNOSIS — Z7982 Long term (current) use of aspirin: Secondary | ICD-10-CM | POA: Diagnosis not present

## 2022-07-12 DIAGNOSIS — Z7902 Long term (current) use of antithrombotics/antiplatelets: Secondary | ICD-10-CM | POA: Diagnosis not present

## 2022-07-12 DIAGNOSIS — Z8673 Personal history of transient ischemic attack (TIA), and cerebral infarction without residual deficits: Secondary | ICD-10-CM | POA: Diagnosis not present

## 2022-07-12 DIAGNOSIS — I739 Peripheral vascular disease, unspecified: Secondary | ICD-10-CM | POA: Diagnosis not present

## 2022-07-12 DIAGNOSIS — Z87891 Personal history of nicotine dependence: Secondary | ICD-10-CM | POA: Diagnosis not present

## 2022-07-12 DIAGNOSIS — E785 Hyperlipidemia, unspecified: Secondary | ICD-10-CM | POA: Diagnosis not present

## 2022-07-12 DIAGNOSIS — N189 Chronic kidney disease, unspecified: Secondary | ICD-10-CM | POA: Diagnosis not present

## 2022-07-12 DIAGNOSIS — I129 Hypertensive chronic kidney disease with stage 1 through stage 4 chronic kidney disease, or unspecified chronic kidney disease: Secondary | ICD-10-CM | POA: Diagnosis not present

## 2022-07-12 DIAGNOSIS — I6529 Occlusion and stenosis of unspecified carotid artery: Secondary | ICD-10-CM | POA: Diagnosis not present

## 2022-07-12 DIAGNOSIS — L03115 Cellulitis of right lower limb: Secondary | ICD-10-CM | POA: Diagnosis not present

## 2022-07-12 DIAGNOSIS — Z4781 Encounter for orthopedic aftercare following surgical amputation: Secondary | ICD-10-CM | POA: Diagnosis not present

## 2022-07-12 LAB — AEROBIC/ANAEROBIC CULTURE W GRAM STAIN (SURGICAL/DEEP WOUND): Gram Stain: NONE SEEN

## 2022-07-13 DIAGNOSIS — Z89411 Acquired absence of right great toe: Secondary | ICD-10-CM | POA: Diagnosis not present

## 2022-07-13 DIAGNOSIS — I739 Peripheral vascular disease, unspecified: Secondary | ICD-10-CM | POA: Diagnosis not present

## 2022-07-13 DIAGNOSIS — Z6820 Body mass index (BMI) 20.0-20.9, adult: Secondary | ICD-10-CM | POA: Diagnosis not present

## 2022-07-13 DIAGNOSIS — M869 Osteomyelitis, unspecified: Secondary | ICD-10-CM | POA: Diagnosis not present

## 2022-07-16 ENCOUNTER — Ambulatory Visit (HOSPITAL_COMMUNITY): Payer: Medicare Other

## 2022-07-16 DIAGNOSIS — D500 Iron deficiency anemia secondary to blood loss (chronic): Secondary | ICD-10-CM | POA: Diagnosis not present

## 2022-07-16 DIAGNOSIS — I129 Hypertensive chronic kidney disease with stage 1 through stage 4 chronic kidney disease, or unspecified chronic kidney disease: Secondary | ICD-10-CM | POA: Diagnosis not present

## 2022-07-16 DIAGNOSIS — Z89431 Acquired absence of right foot: Secondary | ICD-10-CM | POA: Diagnosis not present

## 2022-07-16 DIAGNOSIS — Z87891 Personal history of nicotine dependence: Secondary | ICD-10-CM | POA: Diagnosis not present

## 2022-07-16 DIAGNOSIS — N189 Chronic kidney disease, unspecified: Secondary | ICD-10-CM | POA: Diagnosis not present

## 2022-07-16 DIAGNOSIS — I739 Peripheral vascular disease, unspecified: Secondary | ICD-10-CM | POA: Diagnosis not present

## 2022-07-16 DIAGNOSIS — L03115 Cellulitis of right lower limb: Secondary | ICD-10-CM | POA: Diagnosis not present

## 2022-07-16 DIAGNOSIS — I6529 Occlusion and stenosis of unspecified carotid artery: Secondary | ICD-10-CM | POA: Diagnosis not present

## 2022-07-16 DIAGNOSIS — E785 Hyperlipidemia, unspecified: Secondary | ICD-10-CM | POA: Diagnosis not present

## 2022-07-16 DIAGNOSIS — Z4781 Encounter for orthopedic aftercare following surgical amputation: Secondary | ICD-10-CM | POA: Diagnosis not present

## 2022-07-16 DIAGNOSIS — Z8673 Personal history of transient ischemic attack (TIA), and cerebral infarction without residual deficits: Secondary | ICD-10-CM | POA: Diagnosis not present

## 2022-07-16 DIAGNOSIS — Z7982 Long term (current) use of aspirin: Secondary | ICD-10-CM | POA: Diagnosis not present

## 2022-07-16 DIAGNOSIS — Z7902 Long term (current) use of antithrombotics/antiplatelets: Secondary | ICD-10-CM | POA: Diagnosis not present

## 2022-07-17 ENCOUNTER — Ambulatory Visit (HOSPITAL_COMMUNITY): Payer: Medicare Other

## 2022-07-17 ENCOUNTER — Ambulatory Visit (INDEPENDENT_AMBULATORY_CARE_PROVIDER_SITE_OTHER): Payer: Medicare Other | Admitting: Podiatry

## 2022-07-17 DIAGNOSIS — I129 Hypertensive chronic kidney disease with stage 1 through stage 4 chronic kidney disease, or unspecified chronic kidney disease: Secondary | ICD-10-CM | POA: Diagnosis not present

## 2022-07-17 DIAGNOSIS — N189 Chronic kidney disease, unspecified: Secondary | ICD-10-CM | POA: Diagnosis not present

## 2022-07-17 DIAGNOSIS — I6529 Occlusion and stenosis of unspecified carotid artery: Secondary | ICD-10-CM | POA: Diagnosis not present

## 2022-07-17 DIAGNOSIS — I739 Peripheral vascular disease, unspecified: Secondary | ICD-10-CM | POA: Diagnosis not present

## 2022-07-17 DIAGNOSIS — Z4781 Encounter for orthopedic aftercare following surgical amputation: Secondary | ICD-10-CM | POA: Diagnosis not present

## 2022-07-17 DIAGNOSIS — E785 Hyperlipidemia, unspecified: Secondary | ICD-10-CM | POA: Diagnosis not present

## 2022-07-17 DIAGNOSIS — Z7982 Long term (current) use of aspirin: Secondary | ICD-10-CM | POA: Diagnosis not present

## 2022-07-17 DIAGNOSIS — L03115 Cellulitis of right lower limb: Secondary | ICD-10-CM | POA: Diagnosis not present

## 2022-07-17 DIAGNOSIS — D500 Iron deficiency anemia secondary to blood loss (chronic): Secondary | ICD-10-CM | POA: Diagnosis not present

## 2022-07-17 DIAGNOSIS — Z87891 Personal history of nicotine dependence: Secondary | ICD-10-CM | POA: Diagnosis not present

## 2022-07-17 DIAGNOSIS — M86671 Other chronic osteomyelitis, right ankle and foot: Secondary | ICD-10-CM

## 2022-07-17 DIAGNOSIS — Z89431 Acquired absence of right foot: Secondary | ICD-10-CM | POA: Diagnosis not present

## 2022-07-17 DIAGNOSIS — Z8673 Personal history of transient ischemic attack (TIA), and cerebral infarction without residual deficits: Secondary | ICD-10-CM | POA: Diagnosis not present

## 2022-07-17 DIAGNOSIS — Z7902 Long term (current) use of antithrombotics/antiplatelets: Secondary | ICD-10-CM | POA: Diagnosis not present

## 2022-07-19 NOTE — Progress Notes (Signed)
  Subjective:  Patient ID: Alan Meyers, male    DOB: [birth year],  MRN: [id number]  Chief Complaint  Patient presents with   Routine Post Op    POV #1 - Dos 07/07/22 R Partial 1st ray amputation.     87 y.o. male returns for post-op check.  He is doing well not having much pain  Review of Systems: Negative except as noted in the HPI. Denies N/V/F/Ch.   Objective:  There were no vitals filed for this visit. There is no height or weight on file to calculate BMI. Constitutional Well developed. Well nourished.  Vascular Foot warm and well perfused. Capillary refill normal to all digits.  Calf is soft and supple, no posterior calf or knee pain, negative Homans' sign  Neurologic Normal speech. Oriented to person, place, and time. Epicritic sensation to light touch grossly present bilaterally.  Dermatologic Skin healing well without signs of infection. Skin edges well coapted without signs of infection.  Orthopedic: He has no tenderness to palpation noted about the surgical site.   Path with osteomyelitis and benign appearing tissue at resection margin  Tissue culture with pansensitive Pseudomonas Assessment:   1. Other chronic osteomyelitis of right foot (HCC)    Plan:  Patient was evaluated and treated and all questions answered.  S/p foot surgery right -Progressing as expected.  Seems to be healing well and no dehiscence noted.  He completed his ciprofloxacin -WB Status: Weightbearing to heel in postop shoe -Sutures: Return in 2 weeks to remove. -Medications: No further antibiotics or pain medication required at this point -Foot redressed.  Return in about 2 weeks (around 07/31/2022) for post op (new x-rays), suture removal.

## 2022-07-20 ENCOUNTER — Ambulatory Visit (HOSPITAL_COMMUNITY): Payer: Medicare Other

## 2022-07-20 DIAGNOSIS — L0390 Cellulitis, unspecified: Secondary | ICD-10-CM | POA: Diagnosis not present

## 2022-07-24 ENCOUNTER — Ambulatory Visit (HOSPITAL_COMMUNITY): Payer: Medicare Other | Admitting: Physical Therapy

## 2022-07-25 DIAGNOSIS — Z7902 Long term (current) use of antithrombotics/antiplatelets: Secondary | ICD-10-CM | POA: Diagnosis not present

## 2022-07-25 DIAGNOSIS — L03115 Cellulitis of right lower limb: Secondary | ICD-10-CM | POA: Diagnosis not present

## 2022-07-25 DIAGNOSIS — D500 Iron deficiency anemia secondary to blood loss (chronic): Secondary | ICD-10-CM | POA: Diagnosis not present

## 2022-07-25 DIAGNOSIS — Z8673 Personal history of transient ischemic attack (TIA), and cerebral infarction without residual deficits: Secondary | ICD-10-CM | POA: Diagnosis not present

## 2022-07-25 DIAGNOSIS — N189 Chronic kidney disease, unspecified: Secondary | ICD-10-CM | POA: Diagnosis not present

## 2022-07-25 DIAGNOSIS — Z89431 Acquired absence of right foot: Secondary | ICD-10-CM | POA: Diagnosis not present

## 2022-07-25 DIAGNOSIS — Z4781 Encounter for orthopedic aftercare following surgical amputation: Secondary | ICD-10-CM | POA: Diagnosis not present

## 2022-07-25 DIAGNOSIS — I739 Peripheral vascular disease, unspecified: Secondary | ICD-10-CM | POA: Diagnosis not present

## 2022-07-25 DIAGNOSIS — Z7982 Long term (current) use of aspirin: Secondary | ICD-10-CM | POA: Diagnosis not present

## 2022-07-25 DIAGNOSIS — Z87891 Personal history of nicotine dependence: Secondary | ICD-10-CM | POA: Diagnosis not present

## 2022-07-25 DIAGNOSIS — E785 Hyperlipidemia, unspecified: Secondary | ICD-10-CM | POA: Diagnosis not present

## 2022-07-25 DIAGNOSIS — I6529 Occlusion and stenosis of unspecified carotid artery: Secondary | ICD-10-CM | POA: Diagnosis not present

## 2022-07-25 DIAGNOSIS — I129 Hypertensive chronic kidney disease with stage 1 through stage 4 chronic kidney disease, or unspecified chronic kidney disease: Secondary | ICD-10-CM | POA: Diagnosis not present

## 2022-07-26 ENCOUNTER — Encounter (HOSPITAL_COMMUNITY): Payer: Medicare Other

## 2022-07-26 ENCOUNTER — Encounter: Payer: Medicare Other | Admitting: Vascular Surgery

## 2022-07-27 ENCOUNTER — Ambulatory Visit (HOSPITAL_COMMUNITY): Payer: Medicare Other

## 2022-07-30 ENCOUNTER — Ambulatory Visit (HOSPITAL_COMMUNITY): Payer: Medicare Other | Admitting: Physical Therapy

## 2022-07-30 DIAGNOSIS — I129 Hypertensive chronic kidney disease with stage 1 through stage 4 chronic kidney disease, or unspecified chronic kidney disease: Secondary | ICD-10-CM | POA: Diagnosis not present

## 2022-07-30 DIAGNOSIS — I739 Peripheral vascular disease, unspecified: Secondary | ICD-10-CM | POA: Diagnosis not present

## 2022-07-30 DIAGNOSIS — Z7902 Long term (current) use of antithrombotics/antiplatelets: Secondary | ICD-10-CM | POA: Diagnosis not present

## 2022-07-30 DIAGNOSIS — E785 Hyperlipidemia, unspecified: Secondary | ICD-10-CM | POA: Diagnosis not present

## 2022-07-30 DIAGNOSIS — Z7982 Long term (current) use of aspirin: Secondary | ICD-10-CM | POA: Diagnosis not present

## 2022-07-30 DIAGNOSIS — N189 Chronic kidney disease, unspecified: Secondary | ICD-10-CM | POA: Diagnosis not present

## 2022-07-30 DIAGNOSIS — D500 Iron deficiency anemia secondary to blood loss (chronic): Secondary | ICD-10-CM | POA: Diagnosis not present

## 2022-07-30 DIAGNOSIS — I6529 Occlusion and stenosis of unspecified carotid artery: Secondary | ICD-10-CM | POA: Diagnosis not present

## 2022-07-30 DIAGNOSIS — Z4781 Encounter for orthopedic aftercare following surgical amputation: Secondary | ICD-10-CM | POA: Diagnosis not present

## 2022-07-30 DIAGNOSIS — L03115 Cellulitis of right lower limb: Secondary | ICD-10-CM | POA: Diagnosis not present

## 2022-07-30 DIAGNOSIS — Z89431 Acquired absence of right foot: Secondary | ICD-10-CM | POA: Diagnosis not present

## 2022-07-30 DIAGNOSIS — Z8673 Personal history of transient ischemic attack (TIA), and cerebral infarction without residual deficits: Secondary | ICD-10-CM | POA: Diagnosis not present

## 2022-07-30 DIAGNOSIS — Z87891 Personal history of nicotine dependence: Secondary | ICD-10-CM | POA: Diagnosis not present

## 2022-07-31 ENCOUNTER — Ambulatory Visit (INDEPENDENT_AMBULATORY_CARE_PROVIDER_SITE_OTHER): Payer: Medicare Other | Admitting: Podiatry

## 2022-07-31 DIAGNOSIS — M86671 Other chronic osteomyelitis, right ankle and foot: Secondary | ICD-10-CM

## 2022-07-31 DIAGNOSIS — T8189XA Other complications of procedures, not elsewhere classified, initial encounter: Secondary | ICD-10-CM

## 2022-07-31 MED ORDER — MUPIROCIN 2 % EX OINT: 1.0000 | TOPICAL_OINTMENT | Freq: Every day | CUTANEOUS | 2 refills | Status: DC

## 2022-08-02 ENCOUNTER — Ambulatory Visit (HOSPITAL_COMMUNITY): Payer: Medicare Other | Admitting: Physical Therapy

## 2022-08-02 ENCOUNTER — Encounter: Payer: Medicare Other | Admitting: Podiatry

## 2022-08-02 DIAGNOSIS — R5383 Other fatigue: Secondary | ICD-10-CM | POA: Diagnosis not present

## 2022-08-02 DIAGNOSIS — D649 Anemia, unspecified: Secondary | ICD-10-CM | POA: Diagnosis not present

## 2022-08-03 ENCOUNTER — Encounter: Payer: Self-pay | Admitting: Podiatry

## 2022-08-03 NOTE — Progress Notes (Signed)
  Subjective:  Patient ID: Alan Meyers, male    DOB: 12-11-1935,  MRN: [id number]  Chief Complaint  Patient presents with   Routine Post Op    POV #2 Dos 07/07/22 R partial 1st Ray Amputation.     87 y.o. male returns for post-op check.  He is doing well not having much pain  Review of Systems: Negative except as noted in the HPI. Denies N/V/F/Ch.   Objective:  There were no vitals filed for this visit. There is no height or weight on file to calculate BMI. Constitutional Well developed. Well nourished.  Vascular Foot warm and well perfused. Capillary refill normal to all digits.  Calf is soft and supple, no posterior calf or knee pain, negative Homans' sign  Neurologic Normal speech. Oriented to person, place, and time. Epicritic sensation to light touch grossly present bilaterally.  Dermatologic Majority of both incisions healing well.  The plantar ulceration is a small area of 6 mm circular of delayed healing that appears to be fairly superficial.  Orthopedic: He has no tenderness to palpation noted about the surgical site.   Path with osteomyelitis and benign appearing tissue at resection margin  Tissue culture with pansensitive Pseudomonas Assessment:   1. Other chronic osteomyelitis of right foot (HCC)   2. Delayed surgical wound healing, initial encounter    Plan:  Patient was evaluated and treated and all questions answered.  S/p foot surgery right -May be full weightbearing in postop shoe.  Should be able to plan to fit him for an amputation filler thereafter.  Has a small area of delayed healing that I would like to have reevaluated in 3 weeks.  Rx for mupirocin sent to pharmacy dress this daily.  May continue regular bathing.  Overall he is healed well.  Return in about 3 weeks (around 08/21/2022) for post op (no x-rays), wound care.

## 2022-08-06 DIAGNOSIS — Z89431 Acquired absence of right foot: Secondary | ICD-10-CM | POA: Diagnosis not present

## 2022-08-06 DIAGNOSIS — Z8673 Personal history of transient ischemic attack (TIA), and cerebral infarction without residual deficits: Secondary | ICD-10-CM | POA: Diagnosis not present

## 2022-08-06 DIAGNOSIS — Z4781 Encounter for orthopedic aftercare following surgical amputation: Secondary | ICD-10-CM | POA: Diagnosis not present

## 2022-08-06 DIAGNOSIS — L03115 Cellulitis of right lower limb: Secondary | ICD-10-CM | POA: Diagnosis not present

## 2022-08-06 DIAGNOSIS — I129 Hypertensive chronic kidney disease with stage 1 through stage 4 chronic kidney disease, or unspecified chronic kidney disease: Secondary | ICD-10-CM | POA: Diagnosis not present

## 2022-08-06 DIAGNOSIS — Z7982 Long term (current) use of aspirin: Secondary | ICD-10-CM | POA: Diagnosis not present

## 2022-08-06 DIAGNOSIS — N189 Chronic kidney disease, unspecified: Secondary | ICD-10-CM | POA: Diagnosis not present

## 2022-08-06 DIAGNOSIS — Z6821 Body mass index (BMI) 21.0-21.9, adult: Secondary | ICD-10-CM | POA: Diagnosis not present

## 2022-08-06 DIAGNOSIS — R030 Elevated blood-pressure reading, without diagnosis of hypertension: Secondary | ICD-10-CM | POA: Diagnosis not present

## 2022-08-06 DIAGNOSIS — M869 Osteomyelitis, unspecified: Secondary | ICD-10-CM | POA: Diagnosis not present

## 2022-08-06 DIAGNOSIS — E785 Hyperlipidemia, unspecified: Secondary | ICD-10-CM | POA: Diagnosis not present

## 2022-08-06 DIAGNOSIS — Z87891 Personal history of nicotine dependence: Secondary | ICD-10-CM | POA: Diagnosis not present

## 2022-08-06 DIAGNOSIS — D500 Iron deficiency anemia secondary to blood loss (chronic): Secondary | ICD-10-CM | POA: Diagnosis not present

## 2022-08-06 DIAGNOSIS — I6529 Occlusion and stenosis of unspecified carotid artery: Secondary | ICD-10-CM | POA: Diagnosis not present

## 2022-08-06 DIAGNOSIS — Z89411 Acquired absence of right great toe: Secondary | ICD-10-CM | POA: Diagnosis not present

## 2022-08-06 DIAGNOSIS — Z7902 Long term (current) use of antithrombotics/antiplatelets: Secondary | ICD-10-CM | POA: Diagnosis not present

## 2022-08-06 DIAGNOSIS — I739 Peripheral vascular disease, unspecified: Secondary | ICD-10-CM | POA: Diagnosis not present

## 2022-08-08 DIAGNOSIS — I739 Peripheral vascular disease, unspecified: Secondary | ICD-10-CM | POA: Diagnosis not present

## 2022-08-08 DIAGNOSIS — I6529 Occlusion and stenosis of unspecified carotid artery: Secondary | ICD-10-CM | POA: Diagnosis not present

## 2022-08-08 DIAGNOSIS — E785 Hyperlipidemia, unspecified: Secondary | ICD-10-CM | POA: Diagnosis not present

## 2022-08-08 DIAGNOSIS — L03115 Cellulitis of right lower limb: Secondary | ICD-10-CM | POA: Diagnosis not present

## 2022-08-08 DIAGNOSIS — Z4781 Encounter for orthopedic aftercare following surgical amputation: Secondary | ICD-10-CM | POA: Diagnosis not present

## 2022-08-08 DIAGNOSIS — D500 Iron deficiency anemia secondary to blood loss (chronic): Secondary | ICD-10-CM | POA: Diagnosis not present

## 2022-08-08 DIAGNOSIS — I129 Hypertensive chronic kidney disease with stage 1 through stage 4 chronic kidney disease, or unspecified chronic kidney disease: Secondary | ICD-10-CM | POA: Diagnosis not present

## 2022-08-08 DIAGNOSIS — N189 Chronic kidney disease, unspecified: Secondary | ICD-10-CM | POA: Diagnosis not present

## 2022-08-09 DIAGNOSIS — I739 Peripheral vascular disease, unspecified: Secondary | ICD-10-CM | POA: Diagnosis not present

## 2022-08-09 DIAGNOSIS — M869 Osteomyelitis, unspecified: Secondary | ICD-10-CM | POA: Diagnosis not present

## 2022-08-09 DIAGNOSIS — Z89411 Acquired absence of right great toe: Secondary | ICD-10-CM | POA: Diagnosis not present

## 2022-08-13 ENCOUNTER — Other Ambulatory Visit: Payer: Self-pay | Admitting: *Deleted

## 2022-08-16 ENCOUNTER — Other Ambulatory Visit: Payer: Self-pay | Admitting: *Deleted

## 2022-08-16 DIAGNOSIS — I739 Peripheral vascular disease, unspecified: Secondary | ICD-10-CM

## 2022-08-16 DIAGNOSIS — R0989 Other specified symptoms and signs involving the circulatory and respiratory systems: Secondary | ICD-10-CM

## 2022-08-22 ENCOUNTER — Ambulatory Visit (INDEPENDENT_AMBULATORY_CARE_PROVIDER_SITE_OTHER)
Admission: RE | Admit: 2022-08-22 | Discharge: 2022-08-22 | Disposition: A | Discharge status: Home / Self Care | Payer: Medicare Other | Source: Ambulatory Visit | Attending: Vascular Surgery | Admitting: Vascular Surgery

## 2022-08-22 ENCOUNTER — Ambulatory Visit (INDEPENDENT_AMBULATORY_CARE_PROVIDER_SITE_OTHER): Payer: Medicare Other | Admitting: Physician Assistant

## 2022-08-22 ENCOUNTER — Ambulatory Visit (HOSPITAL_COMMUNITY)
Admission: RE | Admit: 2022-08-22 | Discharge: 2022-08-22 | Disposition: A | Discharge status: Home / Self Care | Payer: Medicare Other | Source: Ambulatory Visit | Attending: Vascular Surgery | Admitting: Vascular Surgery

## 2022-08-22 VITALS — BP 177/82 | HR 72 | Temp 98.1°F | Resp 16 | Ht 69.0 in

## 2022-08-22 DIAGNOSIS — R0989 Other specified symptoms and signs involving the circulatory and respiratory systems: Secondary | ICD-10-CM | POA: Diagnosis not present

## 2022-08-22 DIAGNOSIS — Z9889 Other specified postprocedural states: Secondary | ICD-10-CM | POA: Diagnosis not present

## 2022-08-22 DIAGNOSIS — I739 Peripheral vascular disease, unspecified: Secondary | ICD-10-CM | POA: Insufficient documentation

## 2022-08-22 DIAGNOSIS — I6523 Occlusion and stenosis of bilateral carotid arteries: Secondary | ICD-10-CM

## 2022-08-22 LAB — VAS US ABI WITH/WO TBI
Left ABI: 0.62
Right ABI: 0.84

## 2022-08-22 NOTE — Progress Notes (Signed)
Office Note   History of Present Illness   Alan Meyers is a 87 y.o. ([birth year]) male who presents for a postop visit.  He recently underwent a right first ray amputation by podiatry on 07/07/2022 for osteomyelitis.  He subsequently underwent right SFA/popliteal stenting by Dr. Randie Heinz on 07/09/2022.  He has a remote history of EVAR in July 2014 for a 6.6cm AAA. Shortly after this he also had a left carotid endarterectomy by Dr. Edilia Bo for asymptomatic critical stenosis.  Prior to his angiogram he was lost to follow-up for his carotids and AAA.  At follow up today he is doing well. His right first ray amputation is nearly healed. He has no new wounds of the lower extremities. He also denies any rest pain.  He has been lost to follow-up for his carotid stenosis and AAA s/p EVAR.  He denies any strokelike symptoms or issues with back or abdominal pain.  According to his attached medical records, his most recent CT abdomen/pelvis was in 2017.  At that time his AAA measured 5.5 cm.  During his recent angiogram his aortic endograft was studied and did not demonstrate any endoleaks.   Current Outpatient Medications  Medication Sig Dispense Refill   aspirin 81 MG tablet Take 81 mg by mouth daily.     Cholecalciferol (VITAMIN D-3 PO) Take 1 capsule by mouth daily.     clopidogrel (PLAVIX) 75 MG tablet Take 1 tablet (75 mg total) by mouth daily. 30 tablet 11   Cyanocobalamin (VITAMIN B-12 PO) Take 1 tablet by mouth daily.     HYDROcodone-acetaminophen (NORCO/VICODIN) 5-325 MG tablet Take 1-2 tablets by mouth every 4 (four) hours as needed (moderate to severe pain). 30 tablet 0   metoprolol tartrate (LOPRESSOR) 25 MG tablet Take 0.5 tablets (12.5 mg total) by mouth 2 (two) times daily. 60 tablet 0   mupirocin ointment (BACTROBAN) 2 % Apply 1 Application topically daily. 30 g 2   rosuvastatin (CRESTOR) 5 MG tablet Take 1 tablet (5 mg total) by mouth daily. 30 tablet 11   No current  facility-administered medications for this visit.    REVIEW OF SYSTEMS (negative unless checked):   Cardiac:  []  Chest pain or chest pressure? []  Shortness of breath upon activity? []  Shortness of breath when lying flat? []  Irregular heart rhythm?  Vascular:  []  Pain in calf, thigh, or hip brought on by walking? []  Pain in feet at night that wakes you up from your sleep? []  Blood clot in your veins? []  Leg swelling?  Pulmonary:  []  Oxygen at home? []  Productive cough? []  Wheezing?  Neurologic:  []  Sudden weakness in arms or legs? []  Sudden numbness in arms or legs? []  Sudden onset of difficult speaking or slurred speech? []  Temporary loss of vision in one eye? []  Problems with dizziness?  Gastrointestinal:  []  Blood in stool? []  Vomited blood?  Genitourinary:  []  Burning when urinating? []  Blood in urine?  Psychiatric:  []  Major depression  Hematologic:  []  Bleeding problems? []  Problems with blood clotting?  Dermatologic:  []  Rashes or ulcers?  Constitutional:  []  Fever or chills?  Ear/Nose/Throat:  []  Change in hearing? []  Nose bleeds? []  Sore throat?  Musculoskeletal:  []  Back pain? []  Joint pain? []  Muscle pain?   Physical Examination   Vitals:   08/22/22 1040  BP: (!) 177/82  Pulse: 72  Resp: 16  Temp: 98.1 F (36.7 C)  TempSrc: Temporal  SpO2: 100%  Height: 5\' 9"  (1.753  m)   Body mass index is 18.98 kg/m.  General:  WDWN in NAD; vital signs documented above Gait: Not observed HENT: WNL, normocephalic Pulmonary: normal non-labored breathing , without rales, rhonchi,  wheezing Cardiac: regular without carotid bruit Abdomen: soft, NT, no pulsatile masses Skin: without rashes Vascular Exam/Pulses: palpable femoral pulses bilaterally. Brisk right PT/DP/Peroneal doppler signals Extremities: Without ischemic changes or gangrene.  Nearly healed right first ray amputation Musculoskeletal: no muscle wasting or atrophy  Neurologic: A&O  X 3;  No focal weakness or paresthesias are detected Psychiatric:  The pt has Normal affect.  Non-Invasive Vascular imaging   ABI (08/22/2022) R:  ABI: 0.84 (0.63),  PT: mono DP: mono TBI:  amputated L:  ABI: 0.62 (0.7),  PT: mono DP: mono TBI: 0.12  Carotid Duplex (08/22/2022) 1 to 39% stenosis of bilateral internal carotid arteries  RLE Arterial Duplex (08/22/2022) Patent right common femoral, deep femoral, SFA, popliteal, and tibial arteries without hemodynamically significant stenosis.  About 50% stenosis of the deep femoral artery and distal anterior tibial artery.  Patent right SFA and popliteal stents.  About 50% stenosis of the proximal right SFA stent.  Medical Decision Making   Alan Meyers is a 87 y.o. male who presents for surveillance of PAD  Based on the patient's vascular studies, his ABIs on the right are improved from 0.63 to 0.84.  His right SFA and popliteal stents are patent without hemodynamically significant stenosis.  There is about 50% stenosis of the proximal SFA stent, but this is not flow-limiting. His right first ray amputation was performed by podiatry on 07/07/2022.  This is nearly healed.  He has brisk monophasic DP/PT/peroneal Doppler signals in the right foot. He has no new new lower extremity wounds.  He denies rest pain in his feet. He has a remote history of left carotid endarterectomy in 2014.  His carotid duplex demonstrates 1 to 39% of bilateral internal carotid arteries.  He denies any strokelike symptoms. He has a history of EVAR in 2014 for a 6.6 cm AAA.  It was measured at 5.5 cm in 2017 by CT.  Recent angiogram demonstrated a patent endograft without any leaks.  He denies any abdominal, back, or chest pain. Given that his EVAR was visualized last month, we do not need to repeat imaging for another year.  His carotid arteries have 1-39% stenosis bilaterally, so we can repeat imaging in 1 year as well.  His ABIs on the right have significantly  improved and his SFA/popliteal stents are patent without hemodynamically significant stenosis, so he can follow-up with our office in 6 months with repeat ABIs and right lower extremity arterial duplex.   Loel Dubonnet PA-C [facility] Office: [phone]  Clinic MD: Randie Heinz

## 2022-08-24 ENCOUNTER — Ambulatory Visit (INDEPENDENT_AMBULATORY_CARE_PROVIDER_SITE_OTHER): Payer: Medicare Other | Admitting: Podiatry

## 2022-08-24 DIAGNOSIS — M86671 Other chronic osteomyelitis, right ankle and foot: Secondary | ICD-10-CM | POA: Diagnosis not present

## 2022-08-24 NOTE — Progress Notes (Unsigned)
   Chief Complaint  Patient presents with   Routine Post Op    POV #3 Dos 07/07/22 R partial 1st Ray Amputation.         Subjective:  Patient presents today status post partial first ray amputation of the RT foot. DOS: 07/07/2022. Dr. Sharl Ma, surgeon.  Patient doing well.  No new complaints  Past Medical History:  Diagnosis Date   AAA (abdominal aortic aneurysm) (HCC)    6.1 - 6.2cm   Anemia    Dementia (HCC)    Diarrhea, functional 08/01/2012   Headache(784.0)    Hemorrhoids    Hyperlipidemia    Hypertension    no meds for sev years   Memory loss    Peripheral arterial disease (HCC)    Peripheral neuropathy    Plantar warts 05/29/2012   Pulsatile abdominal mass 08/01/2012    Past Surgical History:  Procedure Laterality Date   ABDOMINAL AORTAGRAM N/A 08/11/2012   Procedure: ABDOMINAL Ronny Flurry;  Surgeon: Chuck Hint, MD;  Location: [facility] CATH LAB;  Service: Cardiovascular;  Laterality: N/A;   ABDOMINAL AORTIC ENDOVASCULAR STENT GRAFT N/A 09/18/2012   Procedure: ABDOMINAL AORTIC ENDOVASCULAR STENT GRAFT;  Surgeon: Chuck Hint, MD;  Location: [facility] OR;  Service: Vascular;  Laterality: N/A;  Ultrasound guided   ABDOMINAL AORTOGRAM W/LOWER EXTREMITY N/A 07/09/2022   Procedure: ABDOMINAL AORTOGRAM W/LOWER EXTREMITY;  Surgeon: Maeola Harman, MD;  Location: [facility] INVASIVE CV LAB;  Service: Cardiovascular;  Laterality: N/A;   AMPUTATION Right 07/07/2022   Procedure: AMPUTATION , FOOT RAY;  Surgeon: Edwin Cap, DPM;  Location: [facility] OR;  Service: Podiatry;  Laterality: Right;   CATARACT EXTRACTION W/ INTRAOCULAR LENS  IMPLANT, BILATERAL     Hx; of   ENDARTERECTOMY Left 10/28/2012   Procedure: ENDARTERECTOMY CAROTID- LEFT;  Surgeon: Chuck Hint, MD;  Location: [facility] OR;  Service: Vascular;  Laterality: Left;   EYE SURGERY Bilateral    cat   HEMORRHOID SURGERY  1970's   PERIPHERAL VASCULAR INTERVENTION  07/09/2022   Procedure: PERIPHERAL VASCULAR  INTERVENTION;  Surgeon: Maeola Harman, MD;  Location: [facility] INVASIVE CV LAB;  Service: Cardiovascular;;  Rt SFA    No Known Allergies  Objective/Physical Exam Neurovascular status intact.  Incision nicely healed. No sign of infectious process noted. No dehiscence. No active bleeding noted.  No edema noted to the surgical extremity.   Assessment: 1. s/p partial first ray amputation RT foot. DOS: 07/07/2022.  Dr. Sharl Ma   Plan of Care:  -Patient was evaluated. X-rays reviewed -Ulcer plantar 1st ray amp healed -Light debridement performed today using a tissue nipper -Has appointment on Tuesday, 08-28-2022, for diabetic shoe and insole fitting -Return to clinic with Dr. Lilian Kapur or myself PRN   Felecia Shelling, DPM [facility]  Dr. Felecia Shelling, DPM    [address] [place], [place] [address]                Office [phone]  Fax [phone]

## 2022-08-28 ENCOUNTER — Ambulatory Visit (INDEPENDENT_AMBULATORY_CARE_PROVIDER_SITE_OTHER): Payer: Medicare Other | Admitting: Podiatry

## 2022-08-28 DIAGNOSIS — M86671 Other chronic osteomyelitis, right ankle and foot: Secondary | ICD-10-CM

## 2022-08-28 NOTE — Progress Notes (Signed)
Patient presents today to casted for custom orthotics and toe filler for Rt great toe   Patient was scanned for 1 pair of custom orthotics with the [employer] scanner.   Financial paperwork signed.    Authorization sent to [facility] for toe filler

## 2022-09-08 ENCOUNTER — Other Ambulatory Visit: Payer: Self-pay

## 2022-09-08 DIAGNOSIS — Z9889 Other specified postprocedural states: Secondary | ICD-10-CM

## 2022-09-08 DIAGNOSIS — I6523 Occlusion and stenosis of bilateral carotid arteries: Secondary | ICD-10-CM

## 2022-09-08 DIAGNOSIS — I739 Peripheral vascular disease, unspecified: Secondary | ICD-10-CM

## 2022-09-26 ENCOUNTER — Ambulatory Visit (INDEPENDENT_AMBULATORY_CARE_PROVIDER_SITE_OTHER): Payer: Medicare Other

## 2022-09-26 DIAGNOSIS — T8189XA Other complications of procedures, not elsewhere classified, initial encounter: Secondary | ICD-10-CM

## 2022-09-26 DIAGNOSIS — I739 Peripheral vascular disease, unspecified: Secondary | ICD-10-CM

## 2022-09-26 DIAGNOSIS — L97513 Non-pressure chronic ulcer of other part of right foot with necrosis of muscle: Secondary | ICD-10-CM | POA: Diagnosis not present

## 2022-09-26 DIAGNOSIS — M86671 Other chronic osteomyelitis, right ankle and foot: Secondary | ICD-10-CM | POA: Diagnosis not present

## 2022-09-26 NOTE — Progress Notes (Signed)
Patient presents today to pick up custom molded DM orthotics with Rt toe filler x1ea diagnosed with HX of ulcer by Dr. Ralene Cork. Rt great toe amputation per Dr Lilian Kapur   Orthotics were dispensed and fit was satisfactory. Reviewed instructions for break-in and wear. Written instructions given to patient.  Patient will follow up as needed.   Addison Bailey CPed, CFo, CFm

## 2022-10-01 DIAGNOSIS — E7849 Other hyperlipidemia: Secondary | ICD-10-CM | POA: Diagnosis not present

## 2022-10-01 DIAGNOSIS — R634 Abnormal weight loss: Secondary | ICD-10-CM | POA: Diagnosis not present

## 2022-10-01 DIAGNOSIS — E559 Vitamin D deficiency, unspecified: Secondary | ICD-10-CM | POA: Diagnosis not present

## 2022-10-01 DIAGNOSIS — I10 Essential (primary) hypertension: Secondary | ICD-10-CM | POA: Diagnosis not present

## 2022-10-01 DIAGNOSIS — N183 Chronic kidney disease, stage 3 unspecified: Secondary | ICD-10-CM | POA: Diagnosis not present

## 2022-10-01 DIAGNOSIS — E039 Hypothyroidism, unspecified: Secondary | ICD-10-CM | POA: Diagnosis not present

## 2022-10-01 DIAGNOSIS — R7303 Prediabetes: Secondary | ICD-10-CM | POA: Diagnosis not present

## 2022-10-02 DIAGNOSIS — Z13220 Encounter for screening for lipoid disorders: Secondary | ICD-10-CM | POA: Diagnosis not present

## 2022-10-02 DIAGNOSIS — I10 Essential (primary) hypertension: Secondary | ICD-10-CM | POA: Diagnosis not present

## 2022-10-02 DIAGNOSIS — E7800 Pure hypercholesterolemia, unspecified: Secondary | ICD-10-CM | POA: Diagnosis not present

## 2022-10-08 DIAGNOSIS — E7800 Pure hypercholesterolemia, unspecified: Secondary | ICD-10-CM | POA: Diagnosis not present

## 2022-10-08 DIAGNOSIS — I10 Essential (primary) hypertension: Secondary | ICD-10-CM | POA: Diagnosis not present

## 2022-10-08 DIAGNOSIS — Z0001 Encounter for general adult medical examination with abnormal findings: Secondary | ICD-10-CM | POA: Diagnosis not present

## 2022-10-08 DIAGNOSIS — I7090 Unspecified atherosclerosis: Secondary | ICD-10-CM | POA: Diagnosis not present

## 2022-10-08 DIAGNOSIS — I714 Abdominal aortic aneurysm, without rupture, unspecified: Secondary | ICD-10-CM | POA: Diagnosis not present

## 2022-10-08 DIAGNOSIS — Z6820 Body mass index (BMI) 20.0-20.9, adult: Secondary | ICD-10-CM | POA: Diagnosis not present

## 2022-10-08 DIAGNOSIS — Z6821 Body mass index (BMI) 21.0-21.9, adult: Secondary | ICD-10-CM | POA: Diagnosis not present

## 2022-10-08 DIAGNOSIS — I739 Peripheral vascular disease, unspecified: Secondary | ICD-10-CM | POA: Diagnosis not present

## 2022-10-08 DIAGNOSIS — R413 Other amnesia: Secondary | ICD-10-CM | POA: Diagnosis not present

## 2022-10-08 DIAGNOSIS — Z89411 Acquired absence of right great toe: Secondary | ICD-10-CM | POA: Diagnosis not present

## 2022-10-08 DIAGNOSIS — N183 Chronic kidney disease, stage 3 unspecified: Secondary | ICD-10-CM | POA: Diagnosis not present

## 2022-10-08 DIAGNOSIS — D649 Anemia, unspecified: Secondary | ICD-10-CM | POA: Diagnosis not present

## 2022-10-16 ENCOUNTER — Inpatient Hospital Stay (HOSPITAL_COMMUNITY)
Admission: EM | Admit: 2022-10-16 | Discharge: 2022-10-20 | DRG: 540 | Disposition: A | Discharge status: Hospice | Payer: Medicare Other | Attending: Family Medicine | Admitting: Family Medicine

## 2022-10-16 ENCOUNTER — Other Ambulatory Visit (HOSPITAL_COMMUNITY): Payer: Self-pay | Admitting: Radiology

## 2022-10-16 ENCOUNTER — Encounter (HOSPITAL_COMMUNITY): Payer: Self-pay | Admitting: Emergency Medicine

## 2022-10-16 ENCOUNTER — Emergency Department (HOSPITAL_COMMUNITY): Payer: Medicare Other

## 2022-10-16 ENCOUNTER — Other Ambulatory Visit (HOSPITAL_COMMUNITY): Payer: Medicare Other

## 2022-10-16 ENCOUNTER — Other Ambulatory Visit: Payer: Self-pay

## 2022-10-16 DIAGNOSIS — Z825 Family history of asthma and other chronic lower respiratory diseases: Secondary | ICD-10-CM | POA: Diagnosis not present

## 2022-10-16 DIAGNOSIS — Z792 Long term (current) use of antibiotics: Secondary | ICD-10-CM

## 2022-10-16 DIAGNOSIS — R600 Localized edema: Secondary | ICD-10-CM | POA: Diagnosis not present

## 2022-10-16 DIAGNOSIS — Z79899 Other long term (current) drug therapy: Secondary | ICD-10-CM

## 2022-10-16 DIAGNOSIS — Z66 Do not resuscitate: Secondary | ICD-10-CM | POA: Diagnosis not present

## 2022-10-16 DIAGNOSIS — L97519 Non-pressure chronic ulcer of other part of right foot with unspecified severity: Secondary | ICD-10-CM | POA: Diagnosis present

## 2022-10-16 DIAGNOSIS — N189 Chronic kidney disease, unspecified: Secondary | ICD-10-CM | POA: Diagnosis present

## 2022-10-16 DIAGNOSIS — Z681 Body mass index (BMI) 19 or less, adult: Secondary | ICD-10-CM

## 2022-10-16 DIAGNOSIS — M869 Osteomyelitis, unspecified: Secondary | ICD-10-CM | POA: Diagnosis present

## 2022-10-16 DIAGNOSIS — N179 Acute kidney failure, unspecified: Secondary | ICD-10-CM | POA: Diagnosis not present

## 2022-10-16 DIAGNOSIS — Z87891 Personal history of nicotine dependence: Secondary | ICD-10-CM | POA: Diagnosis not present

## 2022-10-16 DIAGNOSIS — E785 Hyperlipidemia, unspecified: Secondary | ICD-10-CM | POA: Diagnosis not present

## 2022-10-16 DIAGNOSIS — L98499 Non-pressure chronic ulcer of skin of other sites with unspecified severity: Secondary | ICD-10-CM | POA: Diagnosis not present

## 2022-10-16 DIAGNOSIS — Z515 Encounter for palliative care: Secondary | ICD-10-CM | POA: Diagnosis not present

## 2022-10-16 DIAGNOSIS — Z7902 Long term (current) use of antithrombotics/antiplatelets: Secondary | ICD-10-CM | POA: Diagnosis not present

## 2022-10-16 DIAGNOSIS — M86171 Other acute osteomyelitis, right ankle and foot: Secondary | ICD-10-CM | POA: Diagnosis not present

## 2022-10-16 DIAGNOSIS — F03C11 Unspecified dementia, severe, with agitation: Secondary | ICD-10-CM | POA: Diagnosis present

## 2022-10-16 DIAGNOSIS — R64 Cachexia: Secondary | ICD-10-CM | POA: Diagnosis not present

## 2022-10-16 DIAGNOSIS — Z7982 Long term (current) use of aspirin: Secondary | ICD-10-CM | POA: Diagnosis not present

## 2022-10-16 DIAGNOSIS — E7800 Pure hypercholesterolemia, unspecified: Secondary | ICD-10-CM | POA: Diagnosis present

## 2022-10-16 DIAGNOSIS — I739 Peripheral vascular disease, unspecified: Secondary | ICD-10-CM | POA: Diagnosis present

## 2022-10-16 DIAGNOSIS — Z961 Presence of intraocular lens: Secondary | ICD-10-CM | POA: Diagnosis not present

## 2022-10-16 DIAGNOSIS — Z8249 Family history of ischemic heart disease and other diseases of the circulatory system: Secondary | ICD-10-CM

## 2022-10-16 DIAGNOSIS — Z831 Family history of other infectious and parasitic diseases: Secondary | ICD-10-CM | POA: Diagnosis not present

## 2022-10-16 DIAGNOSIS — M86471 Chronic osteomyelitis with draining sinus, right ankle and foot: Secondary | ICD-10-CM | POA: Diagnosis not present

## 2022-10-16 DIAGNOSIS — L03115 Cellulitis of right lower limb: Secondary | ICD-10-CM | POA: Diagnosis not present

## 2022-10-16 DIAGNOSIS — I70203 Unspecified atherosclerosis of native arteries of extremities, bilateral legs: Secondary | ICD-10-CM | POA: Diagnosis not present

## 2022-10-16 DIAGNOSIS — E876 Hypokalemia: Secondary | ICD-10-CM | POA: Diagnosis not present

## 2022-10-16 DIAGNOSIS — I10 Essential (primary) hypertension: Secondary | ICD-10-CM | POA: Diagnosis not present

## 2022-10-16 DIAGNOSIS — L02611 Cutaneous abscess of right foot: Secondary | ICD-10-CM | POA: Diagnosis not present

## 2022-10-16 LAB — COMPREHENSIVE METABOLIC PANEL
ALT: 48 U/L — ABNORMAL HIGH (ref 0–44)
AST: 51 U/L — ABNORMAL HIGH (ref 15–41)
Albumin: 3.2 g/dL — ABNORMAL LOW (ref 3.5–5.0)
Alkaline Phosphatase: 81 U/L (ref 38–126)
Anion gap: 10 (ref 5–15)
BUN: 34 mg/dL — ABNORMAL HIGH (ref 8–23)
CO2: 23 mmol/L (ref 22–32)
Calcium: 8.7 mg/dL — ABNORMAL LOW (ref 8.9–10.3)
Chloride: 102 mmol/L (ref 98–111)
Creatinine, Ser: 1.38 mg/dL — ABNORMAL HIGH (ref 0.61–1.24)
GFR, Estimated: 50 mL/min — ABNORMAL LOW (ref >60)
Glucose, Bld: 109 mg/dL — ABNORMAL HIGH (ref 70–99)
Potassium: 3.3 mmol/L — ABNORMAL LOW (ref 3.5–5.1)
Sodium: 135 mmol/L (ref 135–145)
Total Bilirubin: 0.9 mg/dL (ref 0.3–1.2)
Total Protein: 7.5 g/dL (ref 6.5–8.1)

## 2022-10-16 LAB — CBC WITH DIFFERENTIAL/PLATELET
Abs Immature Granulocytes: 0.03 10*3/uL (ref 0.00–0.07)
Basophils Absolute: 0 10*3/uL (ref 0.0–0.1)
Basophils Relative: 0 %
Eosinophils Absolute: 0.1 10*3/uL (ref 0.0–0.5)
Eosinophils Relative: 1 %
HCT: 34.7 % — ABNORMAL LOW (ref 39.0–52.0)
Hemoglobin: 11.3 g/dL — ABNORMAL LOW (ref 13.0–17.0)
Immature Granulocytes: 0 %
Lymphocytes Relative: 11 %
Lymphs Abs: 1 10*3/uL (ref 0.7–4.0)
MCH: 28.8 pg (ref 26.0–34.0)
MCHC: 32.6 g/dL (ref 30.0–36.0)
MCV: 88.5 fL (ref 80.0–100.0)
Monocytes Absolute: 0.8 10*3/uL (ref 0.1–1.0)
Monocytes Relative: 8 %
Neutro Abs: 7.4 10*3/uL (ref 1.7–7.7)
Neutrophils Relative %: 80 %
Platelets: 252 10*3/uL (ref 150–400)
RBC: 3.92 MIL/uL — ABNORMAL LOW (ref 4.22–5.81)
RDW: 13.5 % (ref 11.5–15.5)
WBC: 9.4 10*3/uL (ref 4.0–10.5)
nRBC: 0 % (ref 0.0–0.2)

## 2022-10-16 LAB — LACTIC ACID, PLASMA
Lactic Acid, Venous: 1.2 mmol/L (ref 0.5–1.9)
Lactic Acid, Venous: 1.2 mmol/L (ref 0.5–1.9)

## 2022-10-16 MED ORDER — VANCOMYCIN HCL IN DEXTROSE 1-5 GM/200ML-% IV SOLN
1000.0000 mg | Freq: Once | INTRAVENOUS | Status: AC
  Administered 2022-10-16: 1000 mg via INTRAVENOUS
  Filled 2022-10-16: qty 200

## 2022-10-16 MED ORDER — VANCOMYCIN HCL 1250 MG/250ML IV SOLN: 1250.0000 mg | Freq: Once | INTRAVENOUS | Status: DC

## 2022-10-16 MED ORDER — GADOBUTROL 1 MMOL/ML IV SOLN
6.0000 mL | Freq: Once | INTRAVENOUS | Status: AC | PRN
  Administered 2022-10-16: 6 mL via INTRAVENOUS

## 2022-10-16 MED ORDER — POTASSIUM CHLORIDE 20 MEQ PO PACK
40.0000 meq | PACK | Freq: Once | ORAL | Status: DC
  Filled 2022-10-16: qty 2

## 2022-10-16 MED ORDER — SODIUM CHLORIDE 0.9 % IV SOLN: INTRAVENOUS | Status: DC

## 2022-10-16 MED ORDER — VANCOMYCIN HCL IN DEXTROSE 1-5 GM/200ML-% IV SOLN: 1000.0000 mg | INTRAVENOUS | Status: DC

## 2022-10-16 MED ORDER — SODIUM CHLORIDE 0.9 % IV SOLN
2.0000 g | INTRAVENOUS | Status: DC
  Administered 2022-10-16 – 2022-10-19 (×4): 2 g via INTRAVENOUS
  Filled 2022-10-16 (×4): qty 20

## 2022-10-16 NOTE — ED Notes (Signed)
Pt returned from MRI. Now back on monitor.

## 2022-10-16 NOTE — Assessment & Plan Note (Signed)
-  Holding plavix and aspirin for likely procedure  -Continue statin

## 2022-10-16 NOTE — ED Triage Notes (Signed)
Pt with family who reports his right foot is leaking. Pt has right big toe amputated, hole to bottom of foot and foul odor. Pt seen at PCP last Monday. Swelling to foot. Hx of dementia and lives with daughter.

## 2022-10-16 NOTE — ED Provider Notes (Signed)
[place] EMERGENCY DEPARTMENT AT [facility] Provider Note   CSN: [id number] Arrival date & time: 10/16/22  1737     History  Chief Complaint  Patient presents with   Wound Infection    Alan Meyers is a 87 y.o. male.  HPI   This patient is an 87 year old male with a history of dementia, he also has a history of hypertension and high cholesterol, he is on Plavix.  Unfortunately the patient has had a recent history in the last few months of having a right foot infection and required amputation of his right great toe secondary to the infection and osteomyelitis.  He was most recently seen in the vascular surgery office for a postoperative visit from the amputation on May 11, followed by a superficial femoral artery/popliteal stent on May 13,.  The office visit was on August 22, 2022, he has a known history of a AAA measuring 5.5 cm back in 2017.  The patient presents today with increasing redness which is occurred over the last week or so according to his wife who is the primary historian.  He evidently had been seen by his family doctor about a week ago at which time he was instructed to use extra lotion on his foot because it appeared dry although the family states there was some redness there, he has not been on an antibiotic he has not had fevers, he has been up and walking around on his foot and then noticed that he started to have a very foul smell which is what prompted the visit today.  Home Medications Prior to Admission medications   Medication Sig Start Date End Date Taking? Authorizing Provider  aspirin 81 MG tablet Take 81 mg by mouth daily.    [provider]  Cholecalciferol (VITAMIN D-3 PO) Take 1 capsule by mouth daily.    [provider]  clopidogrel (PLAVIX) 75 MG tablet Take 1 tablet (75 mg total) by mouth daily. 07/11/22   Rhetta Mura, MD  Cyanocobalamin (VITAMIN B-12 PO) Take 1 tablet by mouth daily.    [provider]   HYDROcodone-acetaminophen (NORCO/VICODIN) 5-325 MG tablet Take 1-2 tablets by mouth every 4 (four) hours as needed (moderate to severe pain). 07/10/22   Rhetta Mura, MD  metoprolol tartrate (LOPRESSOR) 25 MG tablet Take 0.5 tablets (12.5 mg total) by mouth 2 (two) times daily. 07/10/22   Rhetta Mura, MD  mupirocin ointment (BACTROBAN) 2 % Apply 1 Application topically daily. 07/31/22   McDonald, Rachelle Hora, DPM  rosuvastatin (CRESTOR) 5 MG tablet Take 1 tablet (5 mg total) by mouth daily. 07/11/22   Rhetta Mura, MD      Allergies    Patient has no known allergies.    Review of Systems   Review of Systems  Unable to perform ROS: Dementia    Physical Exam Updated Vital Signs BP 130/69 (BP Location: Right Arm)   Pulse 71   Temp (!) 97.4 F (36.3 C) (Oral)   Resp 18   Ht 1.753 m (5\' 9" )   Wt 58 kg   BMI 18.88 kg/m  Physical Exam Vitals and nursing note reviewed.  Constitutional:      General: He is not in acute distress.    Appearance: He is well-developed.  HENT:     Head: Normocephalic and atraumatic.     Mouth/Throat:     Pharynx: No oropharyngeal exudate.  Eyes:     General: No scleral icterus.  Right eye: No discharge.        Left eye: No discharge.     Conjunctiva/sclera: Conjunctivae normal.     Pupils: Pupils are equal, round, and reactive to light.  Neck:     Thyroid: No thyromegaly.     Vascular: No JVD.  Cardiovascular:     Rate and Rhythm: Normal rate and regular rhythm.     Heart sounds: Normal heart sounds. No murmur heard.    No friction rub. No gallop.  Pulmonary:     Effort: Pulmonary effort is normal. No respiratory distress.     Breath sounds: Normal breath sounds. No wheezing or rales.  Abdominal:     General: Bowel sounds are normal. There is no distension.     Palpations: Abdomen is soft. There is no mass.     Tenderness: There is no abdominal tenderness.  Musculoskeletal:        General: Swelling, tenderness and  deformity present. Normal range of motion.     Cervical back: Normal range of motion and neck supple.     Right lower leg: Edema present.     Left lower leg: No edema.  Lymphadenopathy:     Cervical: No cervical adenopathy.  Skin:    General: Skin is warm and dry.     Findings: Erythema and rash present.     Comments: There is an open draining wound to the plantar aspect of the ball of the right foot with a foul-smelling purulent drainage.  The skin of the right foot ankle and lower shin is red and there is associated edema  Neurological:     Mental Status: He is alert.     Coordination: Coordination normal.     Comments: Able to follow commands without difficulty, no facial droop, cognitive deficit present  Psychiatric:        Behavior: Behavior normal.     ED Results / Procedures / Treatments   Labs (all labs ordered are listed, but only abnormal results are displayed) Labs Reviewed  CBC WITH DIFFERENTIAL/PLATELET - Abnormal; Notable for the following components:      Result Value   RBC 3.92 (*)    Hemoglobin 11.3 (*)    HCT 34.7 (*)    All other components within normal limits  COMPREHENSIVE METABOLIC PANEL - Abnormal; Notable for the following components:   Potassium 3.3 (*)    Glucose, Bld 109 (*)    BUN 34 (*)    Creatinine, Ser 1.38 (*)    Calcium 8.7 (*)    Albumin 3.2 (*)    AST 51 (*)    ALT 48 (*)    GFR, Estimated 50 (*)    All other components within normal limits  LACTIC ACID, PLASMA  LACTIC ACID, PLASMA  CREATININE, SERUM    EKG EKG Interpretation Date/Time:  Tuesday October 16 2022 17:54:09 EDT Ventricular Rate:  68 PR Interval:  200 QRS Duration:  124 QT Interval:  427 QTC Calculation: 455 R Axis:   -47  Text Interpretation: Sinus arrhythmia Nonspecific IVCD with LAD Confirmed by Eber Hong ([id number]) on 10/16/2022 6:07:10 PM  Radiology MR FOOT RIGHT W WO CONTRAST  Result Date: 10/16/2022 CLINICAL DATA:  Infection. EXAM: MRI OF THE RIGHT  FOREFOOT WITHOUT AND WITH CONTRAST TECHNIQUE: Multiplanar, multisequence MR imaging of the right forefoot was performed before and after the administration of intravenous contrast. CONTRAST:  6mL GADAVIST GADOBUTROL 1 MMOL/ML IV SOLN COMPARISON:  MRI right forefoot from [facility] hospital 07/05/2022 (images  only; report unavailable); right foot radiographs 06/19/2022 and 07/04/2022 FINDINGS: Bones/Joint/Cartilage There is new absence of the distal 50% of the great toe metatarsal and great toe phalanges which is presumably secondary to interval amputation. There is high-grade edema and enhancement throughout the distal approximate 2 cm of the remaining first metatarsal with adjacent distal forefoot inflammatory phlegmon concerning for acute osteomyelitis. There is new moderate erosion of the second metatarsal head with scattered low signal foci throughout the metatarsal head which may represent foci of air (sagittal series 7, image 26). There is medial, proximal, and dorsal dislocation of the second toe phalanges with respect of the metatarsal head (coronal series 5, image 12). There is mild-to-moderate erosion of portions of the base of the proximal phalanx of the second toe, the third metatarsal head, and the base of the proximal phalanx of the third toe. There is moderate to high-grade marrow edema throughout the proximal 80% of the proximal phalanx of the second toe, the distal greater than proximal shaft of the second metatarsal diffusely, the distal 60% of the third metatarsal shaft, and the majority of the proximal phalanx of the third toe. Ligaments The Lisfranc ligament complex is grossly intact. Muscles and Tendons Postsurgical resection of the distal aspect of the flexor hallucis longus tendon. Diffuse moderate edema throughout the ventral and dorsal midfoot musculature, nonspecific myositis. Soft tissues There is an ulcer plantar to the second metatarsal head measuring up to 8 mm in transverse dimension,  7 mm in craniocaudal depth, and 9 mm along the longitudinal axis of the foot (axial image 23 and sagittal image 25). There also are connected pockets of decreased T1 and decreased T2 signal with blooming artifact dorsal to the second and third tarsometatarsal joints that appears to represent air extending into a large dorsal soft tissue wound (sagittal images 17 through 33 and axial images 18 through 33). This wound does connect to the dorsal skin at the level of the space between the dorsal base of the third and fourth toes (axial series 4, image 24). There are multiple areas of decreased T1 and increased T2 signal with rim enhancement abscesses within the soft tissues plantar medial to the distal first ray soft tissue amputation site, medial to the second metatarsophalangeal joint, and wrapping around the second metatarsophalangeal joint. IMPRESSION: 1. Interval amputation of the first ray to the mid metatarsal shaft. There is high-grade edema and enhancement throughout the distal approximate 2 cm of the remaining first metatarsal with adjacent distal forefoot inflammatory phlegmon concerning for acute osteomyelitis. 2. There is new moderate erosion of the second metatarsal head with scattered low signal foci throughout the metatarsal head which may represent foci of air within the bone versus within wound packing material in the possible absence of the second metatarsal head. There is medial, proximal, and dorsal dislocation of the second toe phalanges with respect of the metatarsal head. 3. Findings concerning for acute osteomyelitis within the proximal 80% of the proximal phalanx of the second toe, the distal greater than proximal shaft of the second metatarsal diffusely, the distal 60% of the third metatarsal shaft, and the majority of the proximal phalanx of the third toe. These findings are concerning for acute osteomyelitis. 4. There is an ulcer plantar to the second metatarsal head. 5. Large dorsal  forefoot wound at the level of the second and third distal metatarsals. 6. Multiple abscesses wrapping around the second metatarsophalangeal joint and within the distal first ray amputation site extending along the plantar aspect of the  remaining first metatarsal. Electronically Signed   By: Neita Garnet M.D.   On: 10/16/2022 20:59    Procedures Procedures    Medications Ordered in ED Medications  vancomycin (VANCOCIN) IVPB 1000 mg/200 mL premix (has no administration in time range)    ED Course/ Medical Decision Making/ A&P                                 Medical Decision Making Amount and/or Complexity of Data Reviewed Labs: ordered. Radiology: ordered. ECG/medicine tests: ordered.  Risk Prescription drug management. Decision regarding hospitalization.    This patient presents to the ED for concern of worsening redness and swelling of his foot and leg, this involves an extensive number of treatment options, and is a complaint that carries with it a high risk of complications and morbidity.  The differential diagnosis includes infection of the skin and soft tissue's, abscess, osteomyelitis, deep tissue infection   Co morbidities that complicate the patient evaluation  Dementia, prior amputation of his great toe   Additional history obtained:  Additional history obtained from EMR External records from outside source obtained and reviewed including prior amputation - and surgical records from may of 2024   Lab Tests:  I Ordered, and personally interpreted labs.  The pertinent results include: Lactic acid of 1.2, CBC shows no leukocytosis, mild chronic anemia, metabolic panel with a creatinine of 1.38 which is slightly up and a BUN of 34 which is also up from 12, mild hypokalemia.   Imaging Studies ordered:  I ordered imaging studies including MRI of the foot I independently visualized and interpreted imaging which showed osteomyelitis with signs of abscess surrounding  the affected bones I agree with the radiologist interpretation   Cardiac Monitoring: / EKG:  The patient was maintained on a cardiac monitor.  I personally viewed and interpreted the cardiac monitored which showed an underlying rhythm of: Sinus rhythm, no tachycardia or hypotension   Consultations Obtained:  I requested consultation with the hospitalist,  and discussed lab and imaging findings as well as pertinent plan - they recommend: Admit to the hospital,   Problem List / ED Course / Critical interventions / Medication management  Patient given antibiotics, no signs of sepsis I ordered medication including vancomycin for infection Reevaluation of the patient after these medicines showed that the patient no change I have reviewed the patients home medicines and have made adjustments as needed   Social Determinants of Health:  Dementia   Test / Admission - Considered:  Admit and transfer to higher level of care where vascular surgery is available         Final Clinical Impression(s) / ED Diagnoses Final diagnoses:  Other acute osteomyelitis of right foot (HCC)  Cellulitis of right lower extremity    Rx / DC Orders ED Discharge Orders     None         Eber Hong, MD 10/16/22 2216

## 2022-10-16 NOTE — Progress Notes (Signed)
Pharmacy Antibiotic Note  Alan Meyers is a 87 y.o. male admitted on 10/16/2022 with  osteomyelitis .  Pharmacy has been consulted for vancomycin dosing.  Plan: Vancomycin 1000 mg IV x 1 in ED. Vancomycin 1000 mg IV every 48 hours. Ceftriaxone 2000 mg IV every 24 hours. Monitor labs, c/s, and vanco levels as indicated.  Height: 5\' 9"  (175.3 cm) Weight: 58 kg (127 lb 13.9 oz) IBW/kg (Calculated) : 70.7  Temp (24hrs), Avg:97.4 F (36.3 C), Min:97.4 F (36.3 C), Max:97.4 F (36.3 C)  Recent Labs  Lab 10/16/22 1815 10/16/22 1903  WBC 9.4  --   CREATININE 1.38*  --   LATICACIDVEN 1.2 1.2    Estimated Creatinine Clearance: 32.7 mL/min (A) (by C-G formula based on SCr of 1.38 mg/dL (H)).    No Known Allergies  Antimicrobials this admission: Vanco 8/20 >> CTX 8/20 >>  Microbiology results:   Thank you for allowing pharmacy to be a part of this patient's care.  Judeth Cornfield, PharmD Clinical Pharmacist 10/16/2022 9:11 PM

## 2022-10-16 NOTE — Assessment & Plan Note (Signed)
-  Cr 1.38 -baseline 0.9 -Decreased PO intake at home -continue fluids  -Hold nephrotoxic agents when possible

## 2022-10-16 NOTE — ED Notes (Signed)
Patient transported to MRI 

## 2022-10-16 NOTE — H&P (Signed)
History and Physical    Patient: Alan Meyers ZOX:[id number] DOB: 11-Jul-1935 DOA: 10/16/2022 DOS: the patient was seen and examined on 10/16/2022 PCP: Donetta Potts, MD  Patient coming from: Home  Chief Complaint:  Chief Complaint  Patient presents with   Wound Infection   HPI: Alan Meyers is a 87 y.o. male with medical history significant of AAA, dementia, hyperlipidemia, hypertension, peripheral artery disease, and more presents the ED with a chief complaint of sore on foot.  Patient had revascularization earlier this year at [facility].  Patient is not able to provide any history due to his dementia.  Daughter at bedside reports that approximately 10 days ago patient started swelling in his foot.  She is not sure when it became more erythematous.  She reports she noticed drainage from the wound on the day of presentation.  It was malodorous.  Patient has not had any fever.  He has a decreased appetite, but that is not new for him.  There are no other complaints at this time.  Daughter and patient both agree that patient would want to be DNR. Review of Systems: unable to review all systems due to the inability of the patient to answer questions. Past Medical History:  Diagnosis Date   AAA (abdominal aortic aneurysm) (HCC)    6.1 - 6.2cm   Anemia    Dementia (HCC)    Diarrhea, functional 08/01/2012   Headache(784.0)    Hemorrhoids    Hyperlipidemia    Hypertension    no meds for sev years   Memory loss    Peripheral arterial disease (HCC)    Peripheral neuropathy    Plantar warts 05/29/2012   Pulsatile abdominal mass 08/01/2012   Past Surgical History:  Procedure Laterality Date   ABDOMINAL AORTAGRAM N/A 08/11/2012   Procedure: ABDOMINAL Ronny Flurry;  Surgeon: Chuck Hint, MD;  Location: [facility] CATH LAB;  Service: Cardiovascular;  Laterality: N/A;   ABDOMINAL AORTIC ENDOVASCULAR STENT GRAFT N/A 09/18/2012   Procedure: ABDOMINAL AORTIC ENDOVASCULAR STENT GRAFT;   Surgeon: Chuck Hint, MD;  Location: [facility] OR;  Service: Vascular;  Laterality: N/A;  Ultrasound guided   ABDOMINAL AORTOGRAM W/LOWER EXTREMITY N/A 07/09/2022   Procedure: ABDOMINAL AORTOGRAM W/LOWER EXTREMITY;  Surgeon: Maeola Harman, MD;  Location: [facility] INVASIVE CV LAB;  Service: Cardiovascular;  Laterality: N/A;   AMPUTATION Right 07/07/2022   Procedure: AMPUTATION , FOOT RAY;  Surgeon: Edwin Cap, DPM;  Location: [facility] OR;  Service: Podiatry;  Laterality: Right;   CATARACT EXTRACTION W/ INTRAOCULAR LENS  IMPLANT, BILATERAL     Hx; of   ENDARTERECTOMY Left 10/28/2012   Procedure: ENDARTERECTOMY CAROTID- LEFT;  Surgeon: Chuck Hint, MD;  Location: [facility] OR;  Service: Vascular;  Laterality: Left;   EYE SURGERY Bilateral    cat   HEMORRHOID SURGERY  1970's   PERIPHERAL VASCULAR INTERVENTION  07/09/2022   Procedure: PERIPHERAL VASCULAR INTERVENTION;  Surgeon: Maeola Harman, MD;  Location: [facility] INVASIVE CV LAB;  Service: Cardiovascular;;  Rt SFA   Social History:  reports that he quit smoking about 19 years ago. His smoking use included cigarettes. He started smoking about 59 years ago. He has a 60 pack-year smoking history. He has never used smokeless tobacco. He reports that he does not drink alcohol and does not use drugs.  No Known Allergies  Family History  Problem Relation Age of Onset   Tuberculosis Mother    Heart disease Father        Valvular  heart disease and Congestive heart failure   Heart attack Father    Cancer Sister    COPD Brother    COPD Brother     Prior to Admission medications   Medication Sig Start Date End Date Taking? Authorizing Provider  aspirin 81 MG tablet Take 81 mg by mouth daily.   Yes [provider]  Cholecalciferol (VITAMIN D-3 PO) Take 25 mcg by mouth daily.   Yes [provider]  clopidogrel (PLAVIX) 75 MG tablet Take 1 tablet (75 mg total) by mouth daily. 07/11/22  Yes Rhetta Mura, MD   FEROSUL 325 (65 Fe) MG tablet Take 325 mg by mouth daily. 10/09/22  Yes [provider]  rosuvastatin (CRESTOR) 5 MG tablet Take 1 tablet (5 mg total) by mouth daily. 07/11/22  Yes Rhetta Mura, MD    Physical Exam: Vitals:   10/16/22 1744 10/16/22 1745 10/16/22 1915 10/16/22 2100  BP:  130/69 114/64 126/69  Pulse:  71 (!) 101 67  Resp:  18 17 20   Temp:  (!) 97.4 F (36.3 C)    TempSrc:  Oral    SpO2:   98% 99%  Weight: 58 kg     Height: 5\' 9"  (1.753 m)      1.  General: Patient lying supine in bed, cachectic, no acute distress   2. Psychiatric: Alert and oriented to self, mood and behavior normal for situation, pleasant and cooperative with exam   3. Neurologic: Speech and language are normal, face is symmetric, moves all 4 extremities voluntarily, at baseline without acute deficits on limited exam   4. HEENMT:  Head is atraumatic, normocephalic, pupils reactive to light, neck is supple, trachea is midline, mucous membranes are moist   5. Respiratory : Lungs are clear to auscultation bilaterally without wheezing, rhonchi, rales, no cyanosis, no increase in work of breathing or accessory muscle use   6. Cardiovascular : Heart rate normal, rhythm is regular, no murmurs, rubs or gallops, no peripheral edema, peripheral pulses palpated   7. Gastrointestinal:  Abdomen is soft, nondistended, nontender to palpation bowel sounds active, no masses or organomegaly palpated   8. Skin:  Skin is warm, dry and intact without rashes, acute lesions, or ulcers on limited exam   9.Musculoskeletal:  No acute deformities or trauma, no asymmetry in tone, no peripheral edema, peripheral pulses palpated, no tenderness to palpation in the extremities  Data Reviewed: In the ED Afebrile, heart rate slightly tachycardic at 101, respiratory rate 17-18, blood pressure 114/64-130/69, satting 98% No leukocytosis with white blood cell count of 9.4, hemoglobin 11.3, platelets  252 Hypokalemic at 3.3, elevated BUN at 3 4, creatinine 1.38  MRI shows osteomyelitis Admission requested for osteomyelitis, admitting to [facility] as patient recently had recent revascularization with vascular at [facility]  Assessment and Plan: * Osteomyelitis (HCC) -as seen on MRI - remaining first metatarsal on right foot -Continue vancomycin and Rocephin -Lactic acid normal x2 -Recent re-vascularization of right lower extremity -Consult vascular surgery -NPO after midnight -Xfer to [facility]  Acute kidney injury superimposed on chronic kidney disease (HCC) -Cr 1.38 -baseline 0.9 -Decreased PO intake at home -continue fluids  -Hold nephrotoxic agents when possible   Hyperlipidemia -Continue statin  Peripheral artery disease (HCC) -Holding plavix and aspirin for likely procedure  -Continue statin  Hypokalemia -Potassium3.3 -Replete and recheck      Advance Care Planning:   Code Status: DNR  Consults: Vascular surgery  Family Communication: Son in Social worker and daughter at bedside  Severity of Illness: The appropriate patient status for this patient is INPATIENT. Inpatient status is judged to be reasonable and necessary in order to provide the required intensity of service to ensure the patient's safety. The patient's presenting symptoms, physical exam findings, and initial radiographic and laboratory data in the context of their chronic comorbidities is felt to place them at high risk for further clinical deterioration. Furthermore, it is not anticipated that the patient will be medically stable for discharge from the hospital within 2 midnights of admission.   * I certify that at the point of admission it is my clinical judgment that the patient will require inpatient hospital care spanning beyond 2 midnights from the point of admission due to high intensity of service, high risk for further deterioration and high frequency of surveillance required.*  Author: Lilyan Gilford, DO 10/16/2022 9:45 PM  For on call review [email or web address].

## 2022-10-16 NOTE — Assessment & Plan Note (Signed)
-  Potassium3.3 -Replete and recheck

## 2022-10-16 NOTE — ED Notes (Signed)
ED TO INPATIENT HANDOFF REPORT  ED Nurse Name and Phone #: Mariel Sleet Name/Age/Gender Alan Meyers 87 y.o. male Room/Bed: APA19/APA19  Code Status   Code Status: DNR  Home/SNF/Other Home Patient oriented to: self Is this baseline? Yes   Triage Complete: Triage complete  Chief Complaint Osteomyelitis [facility]) [M86.9]  Triage Note Pt with family who reports his right foot is leaking. Pt has right big toe amputated, hole to bottom of foot and foul odor. Pt seen at PCP last Monday. Swelling to foot. Hx of dementia and lives with daughter.    Allergies No Known Allergies  Level of Care/Admitting Diagnosis ED Disposition     ED Disposition  Admit   Condition  --   Comment  Hospital Area: [facility] [[id number]]  Level of Care: Med-Surg [16]  May admit patient to [facility] or [facility] if equivalent level of care is available:: No  Covid Evaluation: Asymptomatic - no recent exposure (last 10 days) testing not required  Diagnosis: Osteomyelitis [facility]) [[id number]]  Admitting Physician: Lilyan Gilford [[id number]]  Attending Physician: Lilyan Gilford [[id number]]  Certification:: I certify this patient will need inpatient services for at least 2 midnights  Expected Medical Readiness: 10/18/2022          B Medical/Surgery History Past Medical History:  Diagnosis Date   AAA (abdominal aortic aneurysm) (HCC)    6.1 - 6.2cm   Anemia    Dementia (HCC)    Diarrhea, functional 08/01/2012   Headache(784.0)    Hemorrhoids    Hyperlipidemia    Hypertension    no meds for sev years   Memory loss    Peripheral arterial disease (HCC)    Peripheral neuropathy    Plantar warts 05/29/2012   Pulsatile abdominal mass 08/01/2012   Past Surgical History:  Procedure Laterality Date   ABDOMINAL AORTAGRAM N/A 08/11/2012   Procedure: ABDOMINAL Ronny Flurry;  Surgeon: Chuck Hint, MD;  Location: [facility] CATH LAB;  Service: Cardiovascular;  Laterality:  N/A;   ABDOMINAL AORTIC ENDOVASCULAR STENT GRAFT N/A 09/18/2012   Procedure: ABDOMINAL AORTIC ENDOVASCULAR STENT GRAFT;  Surgeon: Chuck Hint, MD;  Location: [facility] OR;  Service: Vascular;  Laterality: N/A;  Ultrasound guided   ABDOMINAL AORTOGRAM W/LOWER EXTREMITY N/A 07/09/2022   Procedure: ABDOMINAL AORTOGRAM W/LOWER EXTREMITY;  Surgeon: Maeola Harman, MD;  Location: [facility] INVASIVE CV LAB;  Service: Cardiovascular;  Laterality: N/A;   AMPUTATION Right 07/07/2022   Procedure: AMPUTATION , FOOT RAY;  Surgeon: Edwin Cap, DPM;  Location: [facility] OR;  Service: Podiatry;  Laterality: Right;   CATARACT EXTRACTION W/ INTRAOCULAR LENS  IMPLANT, BILATERAL     Hx; of   ENDARTERECTOMY Left 10/28/2012   Procedure: ENDARTERECTOMY CAROTID- LEFT;  Surgeon: Chuck Hint, MD;  Location: [facility] OR;  Service: Vascular;  Laterality: Left;   EYE SURGERY Bilateral    cat   HEMORRHOID SURGERY  1970's   PERIPHERAL VASCULAR INTERVENTION  07/09/2022   Procedure: PERIPHERAL VASCULAR INTERVENTION;  Surgeon: Maeola Harman, MD;  Location: [facility] INVASIVE CV LAB;  Service: Cardiovascular;;  Rt SFA     A IV Location/Drains/Wounds Patient Lines/Drains/Airways Status     Active Line/Drains/Airways     Name Placement date Placement time Site Days   Peripheral IV 10/16/22 22 G Anterior;Right;Upper Arm 10/16/22  2136  Arm  less than 1   Wound / Incision (Open or Dehisced) 07/06/22 Non-pressure wound Foot Right;Medial Right plantar 07/06/22  1907  Foot  102            Intake/Output Last 24 hours  Intake/Output Summary (Last 24 hours) at 10/16/2022 2147 Last data filed at 10/16/2022 2044 Gross per 24 hour  Intake 200 ml  Output --  Net 200 ml    Labs/Imaging Results for orders placed or performed during the hospital encounter of 10/16/22 (from the past 48 hour(s))  CBC with Differential     Status: Abnormal   Collection Time: 10/16/22  6:15 PM  Result Value Ref Range   WBC 9.4 4.0 -  10.5 K/uL   RBC 3.92 (L) 4.22 - 5.81 MIL/uL   Hemoglobin 11.3 (L) 13.0 - 17.0 g/dL   HCT 07.3 (L) 71.0 - 62.6 %   MCV 88.5 80.0 - 100.0 fL   MCH 28.8 26.0 - 34.0 pg   MCHC 32.6 30.0 - 36.0 g/dL   RDW 94.8 54.6 - 27.0 %   Platelets 252 150 - 400 K/uL   nRBC 0.0 0.0 - 0.2 %   Neutrophils Relative % 80 %   Neutro Abs 7.4 1.7 - 7.7 K/uL   Lymphocytes Relative 11 %   Lymphs Abs 1.0 0.7 - 4.0 K/uL   Monocytes Relative 8 %   Monocytes Absolute 0.8 0.1 - 1.0 K/uL   Eosinophils Relative 1 %   Eosinophils Absolute 0.1 0.0 - 0.5 K/uL   Basophils Relative 0 %   Basophils Absolute 0.0 0.0 - 0.1 K/uL   Immature Granulocytes 0 %   Abs Immature Granulocytes 0.03 0.00 - 0.07 K/uL    Comment: Performed at [facility], [address], [place], [place] [address]  Comprehensive metabolic panel     Status: Abnormal   Collection Time: 10/16/22  6:15 PM  Result Value Ref Range   Sodium 135 135 - 145 mmol/L   Potassium 3.3 (L) 3.5 - 5.1 mmol/L   Chloride 102 98 - 111 mmol/L   CO2 23 22 - 32 mmol/L   Glucose, Bld 109 (H) 70 - 99 mg/dL    Comment: Glucose reference range applies only to samples taken after fasting for at least 8 hours.   BUN 34 (H) 8 - 23 mg/dL   Creatinine, Ser 3.81 (H) 0.61 - 1.24 mg/dL   Calcium 8.7 (L) 8.9 - 10.3 mg/dL   Total Protein 7.5 6.5 - 8.1 g/dL   Albumin 3.2 (L) 3.5 - 5.0 g/dL   AST 51 (H) 15 - 41 U/L   ALT 48 (H) 0 - 44 U/L   Alkaline Phosphatase 81 38 - 126 U/L   Total Bilirubin 0.9 0.3 - 1.2 mg/dL   GFR, Estimated 50 (L) >60 mL/min    Comment: (NOTE) Calculated using the CKD-EPI Creatinine Equation (2021)    Anion gap 10 5 - 15    Comment: Performed at [facility], [address], [place], [place] [address]  Lactic acid, plasma     Status: None   Collection Time: 10/16/22  6:15 PM  Result Value Ref Range   Lactic Acid, Venous 1.2 0.5 - 1.9 mmol/L    Comment: Performed at [facility], [address] [place], [place] [address]  Lactic acid, plasma     Status:  None   Collection Time: 10/16/22  7:03 PM  Result Value Ref Range   Lactic Acid, Venous 1.2 0.5 - 1.9 mmol/L    Comment: Performed at [facility], [address], [place], [place] [address]   MR FOOT RIGHT W WO CONTRAST  Result Date:  10/16/2022 CLINICAL DATA:  Infection. EXAM: MRI OF THE RIGHT FOREFOOT WITHOUT AND WITH CONTRAST TECHNIQUE: Multiplanar, multisequence MR imaging of the right forefoot was performed before and after the administration of intravenous contrast. CONTRAST:  6mL GADAVIST GADOBUTROL 1 MMOL/ML IV SOLN COMPARISON:  MRI right forefoot from [facility] hospital 07/05/2022 (images only; report unavailable); right foot radiographs 06/19/2022 and 07/04/2022 FINDINGS: Bones/Joint/Cartilage There is new absence of the distal 50% of the great toe metatarsal and great toe phalanges which is presumably secondary to interval amputation. There is high-grade edema and enhancement throughout the distal approximate 2 cm of the remaining first metatarsal with adjacent distal forefoot inflammatory phlegmon concerning for acute osteomyelitis. There is new moderate erosion of the second metatarsal head with scattered low signal foci throughout the metatarsal head which may represent foci of air (sagittal series 7, image 26). There is medial, proximal, and dorsal dislocation of the second toe phalanges with respect of the metatarsal head (coronal series 5, image 12). There is mild-to-moderate erosion of portions of the base of the proximal phalanx of the second toe, the third metatarsal head, and the base of the proximal phalanx of the third toe. There is moderate to high-grade marrow edema throughout the proximal 80% of the proximal phalanx of the second toe, the distal greater than proximal shaft of the second metatarsal diffusely, the distal 60% of the third metatarsal shaft, and the majority of the proximal phalanx of the third toe. Ligaments The Lisfranc ligament complex is grossly intact. Muscles and  Tendons Postsurgical resection of the distal aspect of the flexor hallucis longus tendon. Diffuse moderate edema throughout the ventral and dorsal midfoot musculature, nonspecific myositis. Soft tissues There is an ulcer plantar to the second metatarsal head measuring up to 8 mm in transverse dimension, 7 mm in craniocaudal depth, and 9 mm along the longitudinal axis of the foot (axial image 23 and sagittal image 25). There also are connected pockets of decreased T1 and decreased T2 signal with blooming artifact dorsal to the second and third tarsometatarsal joints that appears to represent air extending into a large dorsal soft tissue wound (sagittal images 17 through 33 and axial images 18 through 33). This wound does connect to the dorsal skin at the level of the space between the dorsal base of the third and fourth toes (axial series 4, image 24). There are multiple areas of decreased T1 and increased T2 signal with rim enhancement abscesses within the soft tissues plantar medial to the distal first ray soft tissue amputation site, medial to the second metatarsophalangeal joint, and wrapping around the second metatarsophalangeal joint. IMPRESSION: 1. Interval amputation of the first ray to the mid metatarsal shaft. There is high-grade edema and enhancement throughout the distal approximate 2 cm of the remaining first metatarsal with adjacent distal forefoot inflammatory phlegmon concerning for acute osteomyelitis. 2. There is new moderate erosion of the second metatarsal head with scattered low signal foci throughout the metatarsal head which may represent foci of air within the bone versus within wound packing material in the possible absence of the second metatarsal head. There is medial, proximal, and dorsal dislocation of the second toe phalanges with respect of the metatarsal head. 3. Findings concerning for acute osteomyelitis within the proximal 80% of the proximal phalanx of the second toe, the distal  greater than proximal shaft of the second metatarsal diffusely, the distal 60% of the third metatarsal shaft, and the majority of the proximal phalanx of the third toe. These findings are concerning  for acute osteomyelitis. 4. There is an ulcer plantar to the second metatarsal head. 5. Large dorsal forefoot wound at the level of the second and third distal metatarsals. 6. Multiple abscesses wrapping around the second metatarsophalangeal joint and within the distal first ray amputation site extending along the plantar aspect of the remaining first metatarsal. Electronically Signed   By: Neita Garnet M.D.   On: 10/16/2022 20:59    Pending Labs Unresulted Labs (From admission, onward)     Start     Ordered   10/17/22 0500  Creatinine, serum  Every Mon-Wed-Fri (0500),   R (with TIMED occurrences)      10/16/22 2108   Signed and Held  Comprehensive metabolic panel  Tomorrow morning,   R        Signed and Held   Signed and Held  Magnesium  Tomorrow morning,   R        Signed and Held   Signed and Held  CBC with Differential/Platelet  Tomorrow morning,   R        Signed and Held            Vitals/Pain Today's Vitals   10/16/22 1744 10/16/22 1745 10/16/22 1915 10/16/22 2100  BP:  130/69 114/64 126/69  Pulse:  71 (!) 101 67  Resp:  18 17 20   Temp:  (!) 97.4 F (36.3 C)    TempSrc:  Oral    SpO2:   98% 99%  Weight: 127 lb 13.9 oz (58 kg)     Height: 5\' 9"  (1.753 m)     PainSc: 0-No pain       Isolation Precautions No active isolations  Medications Medications  cefTRIAXone (ROCEPHIN) 2 g in sodium chloride 0.9 % 100 mL IVPB (2 g Intravenous New Bag/Given 10/16/22 2123)  vancomycin (VANCOCIN) IVPB 1000 mg/200 mL premix (has no administration in time range)  0.9 %  sodium chloride infusion (has no administration in time range)  potassium chloride (KLOR-CON) packet 40 mEq (has no administration in time range)  vancomycin (VANCOCIN) IVPB 1000 mg/200 mL premix (0 mg Intravenous Stopped  10/16/22 2044)  gadobutrol (GADAVIST) 1 MMOL/ML injection 6 mL (6 mLs Intravenous Contrast Given 10/16/22 1845)    Mobility   Full range of motion with arms and legs. Normally walks at home.   Focused Assessments   Patient has dementia and is alert to self.   R Recommendations: See Admitting Provider Note  Report given to: Rickard Rhymes  Additional Notes:

## 2022-10-16 NOTE — Assessment & Plan Note (Addendum)
-  as seen on MRI - remaining first metatarsal on right foot -Continue vancomycin and Rocephin -Lactic acid normal x2 -Recent re-vascularization of right lower extremity -Consult vascular surgery -NPO after midnight -Xfer to [facility]

## 2022-10-16 NOTE — Assessment & Plan Note (Signed)
Continue statin. 

## 2022-10-16 NOTE — H&P (Incomplete)
History and Physical    Patient: Alan Meyers NWG:[id number] DOB: 08-31-1935 DOA: 10/16/2022 DOS: the patient was seen and examined on 10/16/2022 PCP: Donetta Potts, MD  Patient coming from: Home  Chief Complaint:  Chief Complaint  Patient presents with  . Wound Infection   HPI: Alan Meyers is a 87 y.o. male with medical history significant of ***  Review of Systems: {ROS_Text:26778} Past Medical History:  Diagnosis Date  . AAA (abdominal aortic aneurysm) (HCC)    6.1 - 6.2cm  . Anemia   . Dementia (HCC)   . Diarrhea, functional 08/01/2012  . Headache(784.0)   . Hemorrhoids   . Hyperlipidemia   . Hypertension    no meds for sev years  . Memory loss   . Peripheral arterial disease (HCC)   . Peripheral neuropathy   . Plantar warts 05/29/2012  . Pulsatile abdominal mass 08/01/2012   Past Surgical History:  Procedure Laterality Date  . ABDOMINAL AORTAGRAM N/A 08/11/2012   Procedure: ABDOMINAL Ronny Flurry;  Surgeon: Chuck Hint, MD;  Location: [facility] CATH LAB;  Service: Cardiovascular;  Laterality: N/A;  . ABDOMINAL AORTIC ENDOVASCULAR STENT GRAFT N/A 09/18/2012   Procedure: ABDOMINAL AORTIC ENDOVASCULAR STENT GRAFT;  Surgeon: Chuck Hint, MD;  Location: [facility] OR;  Service: Vascular;  Laterality: N/A;  Ultrasound guided  . ABDOMINAL AORTOGRAM W/LOWER EXTREMITY N/A 07/09/2022   Procedure: ABDOMINAL AORTOGRAM W/LOWER EXTREMITY;  Surgeon: Maeola Harman, MD;  Location: [facility] INVASIVE CV LAB;  Service: Cardiovascular;  Laterality: N/A;  . AMPUTATION Right 07/07/2022   Procedure: AMPUTATION , FOOT RAY;  Surgeon: Edwin Cap, DPM;  Location: [facility] OR;  Service: Podiatry;  Laterality: Right;  . CATARACT EXTRACTION W/ INTRAOCULAR LENS  IMPLANT, BILATERAL     Hx; of  . ENDARTERECTOMY Left 10/28/2012   Procedure: ENDARTERECTOMY CAROTID- LEFT;  Surgeon: Chuck Hint, MD;  Location: [facility] OR;  Service: Vascular;  Laterality: Left;  . EYE SURGERY Bilateral     cat  . HEMORRHOID SURGERY  1970's  . PERIPHERAL VASCULAR INTERVENTION  07/09/2022   Procedure: PERIPHERAL VASCULAR INTERVENTION;  Surgeon: Maeola Harman, MD;  Location: [facility] INVASIVE CV LAB;  Service: Cardiovascular;;  Rt SFA   Social History:  reports that he quit smoking about 19 years ago. His smoking use included cigarettes. He started smoking about 59 years ago. He has a 60 pack-year smoking history. He has never used smokeless tobacco. He reports that he does not drink alcohol and does not use drugs.  No Known Allergies  Family History  Problem Relation Age of Onset  . Tuberculosis Mother   . Heart disease Father        Valvular heart disease and Congestive heart failure  . Heart attack Father   . Cancer Sister   . COPD Brother   . COPD Brother     Prior to Admission medications   Medication Sig Start Date End Date Taking? Authorizing Provider  aspirin 81 MG tablet Take 81 mg by mouth daily.   Yes [provider]  Cholecalciferol (VITAMIN D-3 PO) Take 25 mcg by mouth daily.   Yes [provider]  clopidogrel (PLAVIX) 75 MG tablet Take 1 tablet (75 mg total) by mouth daily. 07/11/22  Yes Rhetta Mura, MD  FEROSUL 325 (65 Fe) MG tablet Take 325 mg by mouth daily. 10/09/22  Yes [provider]  rosuvastatin (CRESTOR) 5 MG tablet Take 1 tablet (5 mg total) by mouth daily. 07/11/22  Yes Rhetta Mura, MD  Physical Exam: Vitals:   10/16/22 1744 10/16/22 1745 10/16/22 1915 10/16/22 2100  BP:  130/69 114/64 126/69  Pulse:  71 (!) 101 67  Resp:  18 17 20   Temp:  (!) 97.4 F (36.3 C)    TempSrc:  Oral    SpO2:   98% 99%  Weight: 58 kg     Height: 5\' 9"  (1.753 m)      *** Data Reviewed: {Tip this will not be part of the note when signed- Document your independent interpretation of telemetry tracing, EKG, lab, Radiology test or any other diagnostic tests. Add any new diagnostic test ordered  today. (Optional):26781} {Results:26384}  Assessment and Plan: * Osteomyelitis (HCC) -as seen on MRI - remaining first metatarsal on right foot -Continue vancomycin and Rocephin -Lactic acid normal x2 -Recent re-vascularization of right lower extremity -Consult vascular surgery -NPO after midnight -Xfer to [facility]  Acute kidney injury superimposed on chronic kidney disease (HCC) -Cr 1.38 -baseline 0.9 -Decreased PO intake at home -continue fluids  -Hold nephrotoxic agents when possible   Hyperlipidemia -Continue statin  Peripheral artery disease (HCC) -Holding plavix and aspirin for likely procedure  -Continue statin  Hypokalemia -Potassium3.3 -Replete and recheck      Advance Care Planning:   Code Status: DNR ***  Consults: ***  Family Communication: ***  Severity of Illness: {Observation/Inpatient:21159}  Author: Lilyan Gilford, DO 10/16/2022 9:45 PM  For on call review [email or web address].

## 2022-10-17 ENCOUNTER — Inpatient Hospital Stay (HOSPITAL_COMMUNITY): Payer: Medicare Other

## 2022-10-17 DIAGNOSIS — L98499 Non-pressure chronic ulcer of skin of other sites with unspecified severity: Secondary | ICD-10-CM

## 2022-10-17 DIAGNOSIS — L03115 Cellulitis of right lower limb: Secondary | ICD-10-CM

## 2022-10-17 DIAGNOSIS — I70203 Unspecified atherosclerosis of native arteries of extremities, bilateral legs: Secondary | ICD-10-CM

## 2022-10-17 DIAGNOSIS — M86171 Other acute osteomyelitis, right ankle and foot: Secondary | ICD-10-CM

## 2022-10-17 DIAGNOSIS — M869 Osteomyelitis, unspecified: Secondary | ICD-10-CM | POA: Diagnosis not present

## 2022-10-17 LAB — BASIC METABOLIC PANEL
Anion gap: 9 (ref 5–15)
BUN: 20 mg/dL (ref 8–23)
CO2: 21 mmol/L — ABNORMAL LOW (ref 22–32)
Calcium: 8.1 mg/dL — ABNORMAL LOW (ref 8.9–10.3)
Chloride: 106 mmol/L (ref 98–111)
Creatinine, Ser: 1.07 mg/dL (ref 0.61–1.24)
GFR, Estimated: >60 mL/min (ref >60)
Glucose, Bld: 95 mg/dL (ref 70–99)
Potassium: 3.6 mmol/L (ref 3.5–5.1)
Sodium: 136 mmol/L (ref 135–145)

## 2022-10-17 LAB — CBC
HCT: 32.3 % — ABNORMAL LOW (ref 39.0–52.0)
Hemoglobin: 10.5 g/dL — ABNORMAL LOW (ref 13.0–17.0)
MCH: 28.1 pg (ref 26.0–34.0)
MCHC: 32.5 g/dL (ref 30.0–36.0)
MCV: 86.4 fL (ref 80.0–100.0)
Platelets: 232 10*3/uL (ref 150–400)
RBC: 3.74 MIL/uL — ABNORMAL LOW (ref 4.22–5.81)
RDW: 13.2 % (ref 11.5–15.5)
WBC: 8.4 10*3/uL (ref 4.0–10.5)
nRBC: 0 % (ref 0.0–0.2)

## 2022-10-17 LAB — VAS US ABI WITH/WO TBI
Left ABI: 0.87
Right ABI: 0.96

## 2022-10-17 LAB — MAGNESIUM: Magnesium: 1.8 mg/dL (ref 1.7–2.4)

## 2022-10-17 LAB — PHOSPHORUS: Phosphorus: 2.6 mg/dL (ref 2.5–4.6)

## 2022-10-17 LAB — VITAMIN D 25 HYDROXY (VIT D DEFICIENCY, FRACTURES): Vit D, 25-Hydroxy: 57.2 ng/mL (ref 30–100)

## 2022-10-17 LAB — CREATININE, SERUM
Creatinine, Ser: 1.19 mg/dL (ref 0.61–1.24)
GFR, Estimated: >60 mL/min (ref >60)

## 2022-10-17 MED ORDER — HALOPERIDOL LACTATE 5 MG/ML IJ SOLN
2.0000 mg | Freq: Three times a day (TID) | INTRAMUSCULAR | Status: DC | PRN
  Administered 2022-10-17 – 2022-10-19 (×4): 2 mg via INTRAVENOUS
  Filled 2022-10-17 (×4): qty 1

## 2022-10-17 MED ORDER — ENOXAPARIN SODIUM 40 MG/0.4ML IJ SOSY
40.0000 mg | PREFILLED_SYRINGE | Freq: Every evening | INTRAMUSCULAR | Status: DC
  Administered 2022-10-18 – 2022-10-19 (×2): 40 mg via SUBCUTANEOUS
  Filled 2022-10-17 (×3): qty 0.4

## 2022-10-17 MED ORDER — QUETIAPINE FUMARATE 25 MG PO TABS
12.5000 mg | ORAL_TABLET | Freq: Two times a day (BID) | ORAL | Status: DC
  Administered 2022-10-17: 12.5 mg via ORAL
  Filled 2022-10-17: qty 1

## 2022-10-17 MED ORDER — QUETIAPINE FUMARATE 25 MG PO TABS
25.0000 mg | ORAL_TABLET | Freq: Two times a day (BID) | ORAL | Status: DC
  Administered 2022-10-17 – 2022-10-20 (×6): 25 mg via ORAL
  Filled 2022-10-17 (×7): qty 1

## 2022-10-17 MED ORDER — VANCOMYCIN HCL 750 MG/150ML IV SOLN
750.0000 mg | INTRAVENOUS | Status: DC
  Administered 2022-10-17 – 2022-10-19 (×3): 750 mg via INTRAVENOUS
  Filled 2022-10-17 (×4): qty 150

## 2022-10-17 MED ORDER — POTASSIUM CHLORIDE 20 MEQ PO PACK
40.0000 meq | PACK | Freq: Once | ORAL | Status: AC
  Administered 2022-10-17: 40 meq via ORAL
  Filled 2022-10-17: qty 2

## 2022-10-17 NOTE — Progress Notes (Signed)
Ankle-Brachial Index completed. Please see CV Procedures for preliminary results.  Shona Simpson, RVT 10/17/22 11:22 AM

## 2022-10-17 NOTE — Consult Note (Signed)
PODIATRY CONSULTATION  NAME Alan Meyers MRN [id number] DOB Jun 26, 1935 DOA 10/16/2022   Reason for consult: Osteomyelitis RT foot Chief Complaint  Patient presents with   Wound Infection   History of present illness: 87 y.o. male presenting for worsening infection to the right foot. Recent history of Abdominal Aortogram w/ Lower Extremity on 07/09/2022 w/ Dr. Randie Heinz, VVS. According to vascular he is optimized and no further intervention from their standpoint. Prior history of partial first ray amputation. MRI positive for residual OM within the first, second, and third rays of the foot. Podiatry consulted.   Past Medical History:  Diagnosis Date   AAA (abdominal aortic aneurysm) (HCC)    6.1 - 6.2cm   Anemia    Dementia (HCC)    Diarrhea, functional 08/01/2012   Headache(784.0)    Hemorrhoids    Hyperlipidemia    Hypertension    no meds for sev years   Memory loss    Peripheral arterial disease (HCC)    Peripheral neuropathy    Plantar warts 05/29/2012   Pulsatile abdominal mass 08/01/2012       Latest Ref Rng & Units 10/17/2022    9:23 AM 10/16/2022    6:15 PM 07/10/2022    2:50 AM  CBC  WBC 4.0 - 10.5 K/uL 8.4  9.4  7.7   Hemoglobin 13.0 - 17.0 g/dL 86.5  78.4  69.6   Hematocrit 39.0 - 52.0 % 32.3  34.7  32.3   Platelets 150 - 400 K/uL 232  252  306        Latest Ref Rng & Units 10/17/2022    9:23 AM 10/17/2022    6:03 AM 10/16/2022    6:15 PM  BMP  Glucose 70 - 99 mg/dL 95   295   BUN 8 - 23 mg/dL 20   34   Creatinine 2.84 - 1.24 mg/dL 1.32  4.40  1.02   Sodium 135 - 145 mmol/L 136   135   Potassium 3.5 - 5.1 mmol/L 3.6   3.3   Chloride 98 - 111 mmol/L 106   102   CO2 22 - 32 mmol/L 21   23   Calcium 8.9 - 10.3 mg/dL 8.1   8.7     Right foot 10/17/2022  Right foot 10/17/2022   Physical Exam: General: The patient is alert and oriented x3 in no acute distress.   Dermatology: Ulcer to the plantar aspect of the second MTP right foot. Please see above photo    Vascular: VAS [place] LOWER EXTREMITY ARTERIAL DUPLEX 10/17/2022   Neurological: Diminished via light touch  Musculoskeletal Exam: Prior history of partial first ray amputation right foot.  Patient is ambulatory    ASSESSMENT/PLAN OF CARE Severe PAD with no further vascular options for improvement Residual osteomyelitis right forefoot  -Patient was evaluated this evening.  I was unable to have a coherent conversation with the patient.  Today, 10/18/2022 I was able to talk with Gunnar Fusi, daughter, regarding the patient's condition and situation. -Due to the poor perfusion of the foot with likely microvascular disease, I would assume that there is high likelihood of failure with a midfoot amputation.  This would likely include a Lisfranc type amputation at the TMT. -While speaking with the daughter I told her that I would like to reach out with vascular to get their recommendations regarding limb salvage versus palliative care.  I do believe that palliative care would be the best option for the patient. -Spoke with Dr. Blase Mess,  VVS who agrees that there be high likelihood of failure with a more proximal midfoot amputation.  He would recommend palliative care as well. -Will plan to discuss in further detail with the daughter who hopefully is amenable to palliative care versus limb salvage for her father.     Thank you for the consult.  Please contact me directly via secure chat with any questions or concerns.     Felecia Shelling, DPM [facility]  Dr. Felecia Shelling, DPM    [address] [place], [place] [address]                Office [phone]  Fax [phone]

## 2022-10-17 NOTE — Progress Notes (Signed)
[facility] Progress Note  Patient: Alan Meyers    WUJ:[id number]  DOA: 10/16/2022     Date of Service: the patient was seen and examined on 10/17/2022  Chief Complaint  Patient presents with   Wound Infection   Brief hospital course: Loyal Pitstick is a 87 y.o. male with medical history significant of AAA, dementia, hyperlipidemia, hypertension, peripheral artery disease, and more presents the ED with a chief complaint of sore on foot.  Patient had revascularization earlier this year at [facility].  Patient is not able to provide any history due to his dementia.  Daughter at bedside reports that approximately 10 days ago patient started swelling in his foot.  She is not sure when it became more erythematous.  She reports she noticed drainage from the wound on the day of presentation.  It was malodorous.  Patient has not had any fever.  He has a decreased appetite, but that is not new for him.  There are no other complaints at this time.    Assessment and Plan:  Osteomyelitis (HCC) -as seen on MRI - remaining first metatarsal on right foot -Continue vancomycin and Rocephin -Lactic acid normal x2 -Recent re-vascularization of right lower extremity -Consulted vascular surgery and podiatry  -keep NPO after midnight Follow ABI   Acute kidney injury superimposed on chronic kidney disease (HCC) -Cr 1.38--1.07 improved -baseline 0.9 -Decreased PO intake at home -continue fluids  -Hold nephrotoxic agents when possible     Hyperlipidemia -Continue statin   Peripheral artery disease (HCC) -Holding plavix and aspirin for likely procedure  -Continue statin   Hypokalemia -Potassium3.3--3.6 resolved -Replete and recheck   Body mass index is 18.88 kg/m.  Interventions:  Diet: Regular diet DVT Prophylaxis: Subcutaneous Lovenox   Advance goals of care discussion: DNR  Family Communication: family was present at bedside, at the time of interview.  The pt provided permission to  discuss medical plan with the family. Opportunity was given to ask question and all questions were answered satisfactorily.   Disposition:  Pt is from Home, admitted with right foot infection, need to be seen by vascular and podiatry, on IV antibiotics,, which precludes a safe discharge. Discharge to home with home health versus SNF, TBD after PT and OT eval, when stable and cleared by podiatry..  Subjective: No significant events overnight, patient has significant dementia, AO x 1, denied any pain.  Resting comfortably.  RN informed that patient was to be confused and agitated.  Currently patient is resting and calm.  Physical Exam: General: NAD, lying comfortably Appear in no distress, affect appropriate Eyes: PERRLA ENT: Oral Mucosa Clear, moist  Neck: no JVD,  Cardiovascular: S1 and S2 Present, no Murmur,  Respiratory: good respiratory effort, Bilateral Air entry equal and Decreased, no Crackles, no wheezes Abdomen: Bowel Sound present, Soft and no tenderness,  Skin: no rashes Extremities: Right foot mild erythema and tenderness, s/p great amputation.  Left lower extremity no pedal edema, no calf tenderness.  Neurologic: without any new focal findings Gait not checked due to patient safety concerns  Vitals:   10/16/22 2244 10/17/22 0459 10/17/22 0817 10/17/22 1230  BP: 132/62 127/65 115/70 126/66  Pulse: 67 68 63 78  Resp: 14 18 16    Temp: 97.8 F (36.6 C) 97.8 F (36.6 C) 97.8 F (36.6 C) 97.7 F (36.5 C)  TempSrc: Oral Oral Oral Oral  SpO2: 100% 99% 100% 100%  Weight:      Height:        Intake/Output  Summary (Last 24 hours) at 10/17/2022 1435 Last data filed at 10/17/2022 1209 Gross per 24 hour  Intake 300 ml  Output 200 ml  Net 100 ml   Filed Weights   10/16/22 1744  Weight: 58 kg    Data Reviewed: I have personally reviewed and interpreted daily labs, tele strips, imagings as discussed above. I reviewed all nursing notes, pharmacy notes, vitals, pertinent  old records I have discussed plan of care as described above with RN and patient/family.  CBC: Recent Labs  Lab 10/16/22 1815 10/17/22 0923  WBC 9.4 8.4  NEUTROABS 7.4  --   HGB 11.3* 10.5*  HCT 34.7* 32.3*  MCV 88.5 86.4  PLT 252 232   Basic Metabolic Panel: Recent Labs  Lab 10/16/22 1815 10/17/22 0603 10/17/22 0923  NA 135  --  136  K 3.3*  --  3.6  CL 102  --  106  CO2 23  --  21*  GLUCOSE 109*  --  95  BUN 34*  --  20  CREATININE 1.38* 1.19 1.07  CALCIUM 8.7*  --  8.1*  MG  --   --  1.8  PHOS  --   --  2.6    Studies: VAS [place] LOWER EXTREMITY ARTERIAL DUPLEX  Result Date: 10/17/2022 LOWER EXTREMITY ARTERIAL DUPLEX STUDY Patient Name:  KEDRON NOR  Date of Exam:   10/17/2022 Medical Rec #: [id number]     Accession #:    [id number] Date of Birth: 04-Mar-1935    Patient Gender: M Patient Age:   67 years Exam Location:  [facility] Procedure:      VAS [place] LOWER EXTREMITY ARTERIAL DUPLEX Referring Phys: Heath Lark --------------------------------------------------------------------------------  Indications: Ulceration, and peripheral artery disease. High Risk Factors: Hypertension, hyperlipidemia.  Vascular Interventions: 07/09/22 - RT SFA & PopA stnting                         07/07/22 - RT Great Toe amputation. Current ABI:            .96/.87 Limitations: Patient Positioning Comparison Study: No significant changes seen since prior study 08/22/22 Performing Technologist: Shona Simpson  Examination Guidelines: A complete evaluation includes B-mode imaging, spectral Doppler, color Doppler, and power Doppler as needed of all accessible portions of each vessel. Bilateral testing is considered an integral part of a complete examination. Limited examinations for reoccurring indications may be performed as noted.  +-----------+--------+-----+---------------+----------+--------+ RIGHT      PSV cm/sRatioStenosis       Waveform  Comments  +-----------+--------+-----+---------------+----------+--------+ CFA Prox   120                         biphasic           +-----------+--------+-----+---------------+----------+--------+ CFA Distal 112                         biphasic           +-----------+--------+-----+---------------+----------+--------+ DFA        229          50-74% stenosisbiphasic           +-----------+--------+-----+---------------+----------+--------+ SFA Distal 98                          monophasic         +-----------+--------+-----+---------------+----------+--------+ POP Prox   172  monophasic         +-----------+--------+-----+---------------+----------+--------+ POP Distal 104                         monophasic         +-----------+--------+-----+---------------+----------+--------+ ATA Distal 215          30-49% stenosismonophasic         +-----------+--------+-----+---------------+----------+--------+ PTA Distal 57                          monophasic         +-----------+--------+-----+---------------+----------+--------+ PERO [id number]                         monophasic         +-----------+--------+-----+---------------+----------+--------+  Right Stent(s): +-------------------+--------+---------------+----------+--------+ SFA Prox - SFA DistPSV cm/sStenosis       Waveform  Comments +-------------------+--------+---------------+----------+--------+ Prox to Stent      131                    monophasic         +-------------------+--------+---------------+----------+--------+ Proximal Stent     265     50-99% stenosismonophasic         +-------------------+--------+---------------+----------+--------+ Mid Stent          153                    monophasic         +-------------------+--------+---------------+----------+--------+ Distal Stent       127                    monophasic          +-------------------+--------+---------------+----------+--------+ Distal to Stent    129                    monophasic         +-------------------+--------+---------------+----------+--------+  +---------------+--------+--------+----------+--------+ PopA Dist      PSV cm/sStenosisWaveform  Comments +---------------+--------+--------+----------+--------+ Prox to Stent  144             monophasic         +---------------+--------+--------+----------+--------+ Proximal Stent 166             monophasic         +---------------+--------+--------+----------+--------+ Mid Stent      114             monophasic         +---------------+--------+--------+----------+--------+ Distal Stent   114             monophasic         +---------------+--------+--------+----------+--------+ Distal to [id number]             monophasic         +---------------+--------+--------+----------+--------+    Summary: Right: 50-74% stenosis noted in the superficial femoral artery and/or popliteal artery. 30-49% stenosis noted in the anterior tibial artery. Stenosis is noted within the 50-99% stenosis noted in the proximal SFA stent, otherwise SFA stent appears patent.  stent. No significant change compared to previous study.  See table(s) above for measurements and observations.    Preliminary    VAS [place] ABI WITH/WO TBI  Result Date: 10/17/2022  LOWER EXTREMITY DOPPLER STUDY Patient Name:  MAKSYM LAUBENSTEIN  Date of Exam:   10/17/2022 Medical Rec #: [id number]     Accession #:    [id number] Date of Birth: Sep 08, 1935  Patient Gender: M Patient Age:   58 years Exam Location:  [facility] Procedure:      VAS [place] ABI WITH/WO TBI Referring Phys: Heath Lark --------------------------------------------------------------------------------  Indications: Ulceration, and peripheral artery disease. High Risk Factors: Hypertension.  Vascular Interventions: 07/12/22 - RT SFA & PopA stenting                          07/07/22 - RT Great Toe amp. Limitations: Today's exam was limited due to patient positioning and Amputation. Comparison Study: ABI numbers improved since previous study, but no changes in                   waveforms. 08/22/22 Performing Technologist: Shona Simpson  Examination Guidelines: A complete evaluation includes at minimum, Doppler waveform signals and systolic blood pressure reading at the level of bilateral brachial, anterior tibial, and posterior tibial arteries, when vessel segments are accessible. Bilateral testing is considered an integral part of a complete examination. Photoelectric Plethysmograph (PPG) waveforms and toe systolic pressure readings are included as required and additional duplex testing as needed. Limited examinations for reoccurring indications may be performed as noted.  ABI Findings: +---------+------------------+-----+----------+----------+ Right    Rt Pressure (mmHg)IndexWaveform  Comment    +---------+------------------+-----+----------+----------+ Brachial 151                    triphasic            +---------+------------------+-----+----------+----------+ PTA      145               0.96 monophasic           +---------+------------------+-----+----------+----------+ DP       137               0.91 monophasic           +---------+------------------+-----+----------+----------+ Great Toe                                 Amputation +---------+------------------+-----+----------+----------+ +---------+------------------+-----+----------+-------+ Left     Lt Pressure (mmHg)IndexWaveform  Comment +---------+------------------+-----+----------+-------+ Brachial 147                    triphasic         +---------+------------------+-----+----------+-------+ PTA      117               0.77 monophasic        +---------+------------------+-----+----------+-------+ DP       132               0.87 monophasic         +---------+------------------+-----+----------+-------+ Great Toe77                0.51 Abnormal          +---------+------------------+-----+----------+-------+ +-------+-----------+-----------+------------+------------+ ABI/TBIToday's ABIToday's TBIPrevious ABIPrevious TBI +-------+-----------+-----------+------------+------------+ Right  0.96                  0.84                     +-------+-----------+-----------+------------+------------+ Left   0.87       0.51       0.62        0.12         +-------+-----------+-----------+------------+------------+  Bilateral ABIs appear increased compared to prior study on 08/22/22. Left TBIs appear increased compared to prior study on  08/22/22.  Summary: Right: Resting right ankle-brachial index is within normal range. Although ankle brachial indices are within normal limits (0.95-1.29), arterial Doppler waveforms at the ankle suggest some component of arterial occlusive disease. Left: Resting left ankle-brachial index indicates mild left lower extremity arterial disease. The left toe-brachial index is abnormal. Although ankle brachial indices are within the range of mild PAD, arterial Doppler waveforms at the ankle suggest some greater component of arterial occlusive disease. *See table(s) above for measurements and observations.     Preliminary    MR FOOT RIGHT W WO CONTRAST  Result Date: 10/16/2022 CLINICAL DATA:  Infection. EXAM: MRI OF THE RIGHT FOREFOOT WITHOUT AND WITH CONTRAST TECHNIQUE: Multiplanar, multisequence MR imaging of the right forefoot was performed before and after the administration of intravenous contrast. CONTRAST:  6mL GADAVIST GADOBUTROL 1 MMOL/ML IV SOLN COMPARISON:  MRI right forefoot from [facility] hospital 07/05/2022 (images only; report unavailable); right foot radiographs 06/19/2022 and 07/04/2022 FINDINGS: Bones/Joint/Cartilage There is new absence of the distal 50% of the great toe metatarsal and great toe  phalanges which is presumably secondary to interval amputation. There is high-grade edema and enhancement throughout the distal approximate 2 cm of the remaining first metatarsal with adjacent distal forefoot inflammatory phlegmon concerning for acute osteomyelitis. There is new moderate erosion of the second metatarsal head with scattered low signal foci throughout the metatarsal head which may represent foci of air (sagittal series 7, image 26). There is medial, proximal, and dorsal dislocation of the second toe phalanges with respect of the metatarsal head (coronal series 5, image 12). There is mild-to-moderate erosion of portions of the base of the proximal phalanx of the second toe, the third metatarsal head, and the base of the proximal phalanx of the third toe. There is moderate to high-grade marrow edema throughout the proximal 80% of the proximal phalanx of the second toe, the distal greater than proximal shaft of the second metatarsal diffusely, the distal 60% of the third metatarsal shaft, and the majority of the proximal phalanx of the third toe. Ligaments The Lisfranc ligament complex is grossly intact. Muscles and Tendons Postsurgical resection of the distal aspect of the flexor hallucis longus tendon. Diffuse moderate edema throughout the ventral and dorsal midfoot musculature, nonspecific myositis. Soft tissues There is an ulcer plantar to the second metatarsal head measuring up to 8 mm in transverse dimension, 7 mm in craniocaudal depth, and 9 mm along the longitudinal axis of the foot (axial image 23 and sagittal image 25). There also are connected pockets of decreased T1 and decreased T2 signal with blooming artifact dorsal to the second and third tarsometatarsal joints that appears to represent air extending into a large dorsal soft tissue wound (sagittal images 17 through 33 and axial images 18 through 33). This wound does connect to the dorsal skin at the level of the space between the dorsal  base of the third and fourth toes (axial series 4, image 24). There are multiple areas of decreased T1 and increased T2 signal with rim enhancement abscesses within the soft tissues plantar medial to the distal first ray soft tissue amputation site, medial to the second metatarsophalangeal joint, and wrapping around the second metatarsophalangeal joint. IMPRESSION: 1. Interval amputation of the first ray to the mid metatarsal shaft. There is high-grade edema and enhancement throughout the distal approximate 2 cm of the remaining first metatarsal with adjacent distal forefoot inflammatory phlegmon concerning for acute osteomyelitis. 2. There is new moderate erosion of the second metatarsal head with  scattered low signal foci throughout the metatarsal head which may represent foci of air within the bone versus within wound packing material in the possible absence of the second metatarsal head. There is medial, proximal, and dorsal dislocation of the second toe phalanges with respect of the metatarsal head. 3. Findings concerning for acute osteomyelitis within the proximal 80% of the proximal phalanx of the second toe, the distal greater than proximal shaft of the second metatarsal diffusely, the distal 60% of the third metatarsal shaft, and the majority of the proximal phalanx of the third toe. These findings are concerning for acute osteomyelitis. 4. There is an ulcer plantar to the second metatarsal head. 5. Large dorsal forefoot wound at the level of the second and third distal metatarsals. 6. Multiple abscesses wrapping around the second metatarsophalangeal joint and within the distal first ray amputation site extending along the plantar aspect of the remaining first metatarsal. Electronically Signed   By: Neita Garnet M.D.   On: 10/16/2022 20:59    Scheduled Meds:  potassium chloride  40 mEq Oral Once   QUEtiapine  12.5 mg Oral BID   Continuous Infusions:  sodium chloride 100 mL/hr at 10/16/22 2326    cefTRIAXone (ROCEPHIN)  IV 200 mL/hr at 10/17/22 0531   vancomycin     PRN Meds:   Time spent: 35 minutes  Author: Gillis Santa. MD [facility] 10/17/2022 2:35 PM  To reach On-call, see care teams to locate the attending and reach out to them via [email or web address]. If 7PM-7AM, please contact night-coverage If you still have difficulty reaching the attending provider, please page the [facility] (Director on Call) for [facility] on amion for assistance.

## 2022-10-17 NOTE — TOC Initial Note (Signed)
Transition of Care [facility]) - Initial/Assessment Note    Patient Details  Name: Alan Meyers MRN: [id number] Date of Birth: 1935/08/14  [facility] CM/SW Contact:    Janae Bridgeman, RN Phone Number: 10/17/2022, 11:13 AM  Clinical Narrative:                 CM met at the bedside to meet with the patient/family and patient was not present in the room at this time.  The patient's daughter, Alan Meyers was present to provide history and discuss TOC needs for the patient.  Patient has dementia and lives at home with the daughter.  The daughter states that patient has infection in his foot and is waiting on Vascular surgery consult today with possible need for surgery and/or amputation.  The patient normally ambulates without use of DME - patient has RW at the home and does not use.  The daughter states her long term goal is to have the patient in [facility] in [place].  Daughter states that she spoke with PCP in January 2024 and has FL2 and Tb skins test that was previously negative.  FL2 will need to be updated during hospital stay pending medical/surgical stability.  If patient needs surgery - patient will likely need SNF work up - daughter is aware.  TOC Team will continue to follow the patient for disposition needs.  Patient is pending vascular surgery evaluation later today for osteomyelitis.   Expected Discharge Plan: Skilled Nursing Facility Barriers to Discharge: Continued Medical Work up   Patient Goals and CMS Choice Patient states their goals for this hospitalization and ongoing recovery are:: Patient unable to state goals CMS Medicare.gov Compare Post Acute Care list provided to:: Patient Represenative (must comment) (daughter present at the bedside) Choice offered to / list presented to : Adult Children [place] ownership interest in [facility].provided to:: Adult Children    Expected Discharge Plan and Services   Discharge Planning  Services: CM Consult   Living arrangements for the past 2 months: Single Family Home                                      Prior Living Arrangements/Services Living arrangements for the past 2 months: Single Family Home Lives with:: Adult Children (Patient lives with his daughter, Alan Meyers) Patient language and need for interpreter reviewed:: Yes Do you feel safe going back to the place where you live?: Yes      Need for Family Participation in Patient Care: Yes (Comment) Care giver support system in place?: Yes (comment) Current home services: DME ([facility], borrowed RW (does not use)) Criminal Activity/Legal Involvement Pertinent to Current Situation/Hospitalization: No - Comment as needed  Activities of Daily Living   ADL Screening (condition at time of admission) Patient's cognitive ability adequate to safely complete daily activities?: No Is the patient deaf or have difficulty hearing?: Yes Does the patient have difficulty seeing, even when wearing glasses/contacts?: No Does the patient have difficulty concentrating, remembering, or making decisions?: Yes Patient able to express need for assistance with ADLs?: No Does the patient have difficulty dressing or bathing?: Yes Independently performs ADLs?: No Communication: Needs assistance Is this a change from baseline?: Pre-admission baseline Dressing (OT): Needs assistance Is this a change from baseline?: Pre-admission baseline Grooming: Needs assistance Is this a change from baseline?: Pre-admission baseline Feeding: Needs assistance Is this a change  from baseline?: Pre-admission baseline Bathing: Needs assistance Is this a change from baseline?: Pre-admission baseline Toileting: Needs assistance Is this a change from baseline?: Pre-admission baseline In/Out Bed: Needs assistance Is this a change from baseline?: Pre-admission baseline Walks in Home: Needs assistance Is this a change from baseline?: Pre-admission  baseline Does the patient have difficulty walking or climbing stairs?: Yes Weakness of Legs: Right Weakness of Arms/Hands: None  Permission Sought/Granted Permission sought to share information with : Family Supports, Magazine features editor, Case Manager Permission granted to share information with : Yes, Verbal Permission Granted        Permission granted to share info w Relationship: daughter, Alan Meyers - [phone]     Emotional Assessment Appearance:: Appears stated age Attitude/Demeanor/Rapport: Gracious Affect (typically observed): Accepting Orientation: : Oriented to Self Alcohol / Substance Use: Not Applicable Psych Involvement: No (comment)  Admission diagnosis:  Osteomyelitis (HCC) [M86.9] Cellulitis of right lower extremity [L03.115] Other acute osteomyelitis of right foot (HCC) [M86.171] Patient Active Problem List   Diagnosis Date Noted   Hypokalemia 10/16/2022   Osteomyelitis (HCC) 07/06/2022   Essential hypertension 06/19/2022   Hyperlipidemia 06/19/2022   Peripheral artery disease (HCC) 06/19/2022   Memory loss 06/19/2022   Acute kidney injury superimposed on chronic kidney disease (HCC) 06/19/2022   Cellulitis of right lower leg 06/19/2022   History of CVA (cerebrovascular accident) 06/19/2022   Iron deficiency anemia due to chronic blood loss 11/06/2021   Occlusion and stenosis of carotid artery without mention of cerebral infarction 08/27/2012   AAA (abdominal aortic aneurysm) without rupture-6.1 cm 08/06/2012   PCP:  Donetta Potts, MD Pharmacy:   [facility] [address], [place] - [address] [place] [address] Phone: [phone] Fax: [phone]     Social Determinants of Health (SDOH) Social History: SDOH Screenings   Food Insecurity: Patient Unable To Answer (10/16/2022)  Housing: Patient Unable To Answer (10/16/2022)  Transportation Needs: No Transportation Needs (10/16/2022)  Utilities: Not At Risk  (10/16/2022)  Tobacco Use: Medium Risk (10/16/2022)   SDOH Interventions:     Readmission Risk Interventions    10/17/2022   11:13 AM  Readmission Risk Prevention Plan  Transportation Screening Complete  PCP or Specialist Appt within 5-7 Days Complete  Home Care Screening Complete  Medication Review (RN CM) Complete

## 2022-10-17 NOTE — Progress Notes (Signed)
Lower extremity arterial duplex completed. Please see CV Procedures for preliminary results.  Shona Simpson, RVT 10/17/22 11:23 AM

## 2022-10-17 NOTE — Progress Notes (Signed)
Pharmacy Antibiotic Note  Alan Meyers is a 87 y.o. male admitted on 10/16/2022 with  osteomyelitis .  Pharmacy has been consulted for vancomycin dosing.  Scr down to 1.19. We will adjust vanc dose.   Plan: Change vancomycin 750 mg IV q24>>AUC 500, scr 1.19  Ceftriaxone 2000 mg IV every 24 hours. Monitor labs, c/s, and vanco levels as indicated.  Height: 5\' 9"  (175.3 cm) Weight: 58 kg (127 lb 13.9 oz) IBW/kg (Calculated) : 70.7  Temp (24hrs), Avg:97.7 F (36.5 C), Min:97.4 F (36.3 C), Max:97.8 F (36.6 C)  Recent Labs  Lab 10/16/22 1815 10/16/22 1903 10/17/22 0603  WBC 9.4  --   --   CREATININE 1.38*  --  1.19  LATICACIDVEN 1.2 1.2  --     Estimated Creatinine Clearance: 37.9 mL/min (by C-G formula based on SCr of 1.19 mg/dL).    No Known Allergies  Antimicrobials this admission: Vanco 8/20 >> CTX 8/20 >>  Microbiology results:   Ulyses Southward, PharmD, BCIDP, AAHIVP, CPP Infectious Disease Pharmacist 10/17/2022 8:02 AM

## 2022-10-17 NOTE — Consult Note (Addendum)
Hospital Consult    Reason for Consult: Right foot osteomyelitis Requesting Physician: [facility] MRN #:  [id number]  History of Present Illness: This is a 87 y.o. male with past medical history significant for hypertension, hyperlipidemia, PAD.  He recently underwent right SFA and popliteal artery stenting by Dr. Randie Heinz on 07/09/2022.  On 07/07/2022 he underwent first toe ray amputation on the right foot due to osteomyelitis.  Unfortunately he developed a new wound.  Workup included MRI which was positive for osteomyelitis.  He denies any claudication or rest pain of the right lower extremity.  He denies tobacco use.  He has been taking his aspirin, Plavix, statin daily.  Past Medical History:  Diagnosis Date   AAA (abdominal aortic aneurysm) (HCC)    6.1 - 6.2cm   Anemia    Dementia (HCC)    Diarrhea, functional 08/01/2012   Headache(784.0)    Hemorrhoids    Hyperlipidemia    Hypertension    no meds for sev years   Memory loss    Peripheral arterial disease (HCC)    Peripheral neuropathy    Plantar warts 05/29/2012   Pulsatile abdominal mass 08/01/2012    Past Surgical History:  Procedure Laterality Date   ABDOMINAL AORTAGRAM N/A 08/11/2012   Procedure: ABDOMINAL Ronny Flurry;  Surgeon: Chuck Hint, MD;  Location: [facility] CATH LAB;  Service: Cardiovascular;  Laterality: N/A;   ABDOMINAL AORTIC ENDOVASCULAR STENT GRAFT N/A 09/18/2012   Procedure: ABDOMINAL AORTIC ENDOVASCULAR STENT GRAFT;  Surgeon: Chuck Hint, MD;  Location: [facility] OR;  Service: Vascular;  Laterality: N/A;  Ultrasound guided   ABDOMINAL AORTOGRAM W/LOWER EXTREMITY N/A 07/09/2022   Procedure: ABDOMINAL AORTOGRAM W/LOWER EXTREMITY;  Surgeon: Maeola Harman, MD;  Location: [facility] INVASIVE CV LAB;  Service: Cardiovascular;  Laterality: N/A;   AMPUTATION Right 07/07/2022   Procedure: AMPUTATION , FOOT RAY;  Surgeon: Edwin Cap, DPM;  Location: [facility] OR;  Service: Podiatry;  Laterality: Right;   CATARACT  EXTRACTION W/ INTRAOCULAR LENS  IMPLANT, BILATERAL     Hx; of   ENDARTERECTOMY Left 10/28/2012   Procedure: ENDARTERECTOMY CAROTID- LEFT;  Surgeon: Chuck Hint, MD;  Location: [facility] OR;  Service: Vascular;  Laterality: Left;   EYE SURGERY Bilateral    cat   HEMORRHOID SURGERY  1970's   PERIPHERAL VASCULAR INTERVENTION  07/09/2022   Procedure: PERIPHERAL VASCULAR INTERVENTION;  Surgeon: Maeola Harman, MD;  Location: [facility] INVASIVE CV LAB;  Service: Cardiovascular;;  Rt SFA    No Known Allergies  Prior to Admission medications   Medication Sig Start Date End Date Taking? Authorizing Provider  aspirin 81 MG tablet Take 81 mg by mouth daily.   Yes [provider]  Cholecalciferol (VITAMIN D-3 PO) Take 25 mcg by mouth daily.   Yes [provider]  clopidogrel (PLAVIX) 75 MG tablet Take 1 tablet (75 mg total) by mouth daily. 07/11/22  Yes Rhetta Mura, MD  FEROSUL 325 (65 Fe) MG tablet Take 325 mg by mouth daily. 10/09/22  Yes [provider]  rosuvastatin (CRESTOR) 5 MG tablet Take 1 tablet (5 mg total) by mouth daily. 07/11/22  Yes Rhetta Mura, MD    Social History   Socioeconomic History   Marital status: Single    Spouse name: Not on file   Number of children: 2   Years of education: Not on file   Highest education level: Not on file  Occupational History   Not on file  Tobacco Use   Smoking status: Former  Current packs/day: 0.00    Average packs/day: 1.5 packs/day for 40.0 years (60.0 ttl pk-yrs)    Types: Cigarettes    Start date: 02/27/1963    Quit date: 02/27/2003    Years since quitting: 19.6   Smokeless tobacco: Never  Substance and Sexual Activity   Alcohol use: No   Drug use: No   Sexual activity: Not on file  Other Topics Concern   Not on file  Social History Narrative   Lives alone   Social Determinants of Health   Financial Resource Strain: Not on file  Food Insecurity: Patient Unable To Answer  (10/16/2022)   Hunger Vital Sign    Worried About Running Out of Food in the Last Year: Patient unable to answer    Ran Out of Food in the Last Year: Patient unable to answer  Transportation Needs: No Transportation Needs (10/16/2022)   PRAPARE - Administrator, Civil Service (Medical): No    Lack of Transportation (Non-Medical): No  Physical Activity: Not on file  Stress: Not on file  Social Connections: Not on file  Intimate Partner Violence: Not At Risk (10/16/2022)   Humiliation, Afraid, Rape, and Kick questionnaire    Fear of Current or Ex-Partner: No    Emotionally Abused: No    Physically Abused: No    Sexually Abused: No     Family History  Problem Relation Age of Onset   Tuberculosis Mother    Heart disease Father        Valvular heart disease and Congestive heart failure   Heart attack Father    Cancer Sister    COPD Brother    COPD Brother     ROS: Otherwise negative unless mentioned in HPI  Physical Examination  Vitals:   10/17/22 0459 10/17/22 0817  BP: 127/65 115/70  Pulse: 68 63  Resp: 18 16  Temp: 97.8 F (36.6 C) 97.8 F (36.6 C)  SpO2: 99% 100%   Body mass index is 18.88 kg/m.  General:  WDWN in NAD Gait: Not observed HENT: WNL, normocephalic Pulmonary: normal non-labored breathing, without Rales, rhonchi,  wheezing Cardiac: regular Abdomen:  soft, NT/ND, no masses Skin: without rashes Vascular Exam/Pulses: palpable R DP pulse Extremities: Ulceration on the plantar surface of the right foot with cellulitis of the right foot extending up the right ankle.  Right foot edematous; fluctuant collection dorsal right foot Musculoskeletal: no muscle wasting or atrophy  Neurologic: A&O X 3;  No focal weakness or paresthesias are detected; speech is fluent/normal Psychiatric:  The pt has Normal affect. Lymph:  Unremarkable  CBC    Component Value Date/Time   WBC 8.4 10/17/2022 0923   RBC 3.74 (L) 10/17/2022 0923   HGB 10.5 (L)  10/17/2022 0923   HCT 32.3 (L) 10/17/2022 0923   PLT 232 10/17/2022 0923   MCV 86.4 10/17/2022 0923   MCH 28.1 10/17/2022 0923   MCHC 32.5 10/17/2022 0923   RDW 13.2 10/17/2022 0923   LYMPHSABS 1.0 10/16/2022 1815   MONOABS 0.8 10/16/2022 1815   EOSABS 0.1 10/16/2022 1815   BASOSABS 0.0 10/16/2022 1815    BMET    Component Value Date/Time   NA 136 10/17/2022 0923   K 3.6 10/17/2022 0923   CL 106 10/17/2022 0923   CO2 21 (L) 10/17/2022 0923   GLUCOSE 95 10/17/2022 0923   BUN 20 10/17/2022 0923   CREATININE 1.07 10/17/2022 0923   CREATININE 1.00 08/18/2012 1330   CALCIUM 8.1 (L) 10/17/2022 7829  GFRNONAA >60 10/17/2022 0923   GFRAA >90 10/29/2012 0415    COAGS: Lab Results  Component Value Date   INR 0.92 10/22/2012   INR 1.06 09/18/2012   INR 0.97 09/12/2012     Non-Invasive Vascular Imaging:   MR demonstrating osteomyelitis of the first and second metatarsals with multiple abscesses in the foot   ASSESSMENT/PLAN: This is a 87 y.o. male with osteomyelitis of the right foot  -Mr. Gauge Skluzacek is an 87 year old male who underwent right SFA and popliteal artery stenting in May of this year.  He also underwent right great toe ray amputation at the same time.  Unfortunately he developed a new dorsal foot wound.  MR is positive for first and second toe osteomyelitis as well as deep space abscesses.  He has a palpable DP pulse.  We will check a right lower extremity arterial duplex and ABI however unfortunately patient may be optimized from a vascular surgery standpoint and is at high risk for limb loss.  On-call vascular surgeon Dr. Lenell Antu will evaluate the patient later today and provide further treatment plans.   Emilie Rutter PA-C [facility] [phone]   VASCULAR STAFF ADDENDUM: I have independently interviewed and examined the patient. I agree with the above.  No options remain for vascular salvage of RLE. Recommend major  amputation. Patient confused and not able to make decision. Recommend palliative care evaluation to help with decision making. We are happy to assist with amputation if this is what he and the family desire.  Rande Brunt. Lenell Antu, MD [facility] Phone Number: [phone] 10/17/2022 5:06 PM

## 2022-10-18 DIAGNOSIS — M869 Osteomyelitis, unspecified: Secondary | ICD-10-CM | POA: Diagnosis not present

## 2022-10-18 LAB — BASIC METABOLIC PANEL
Anion gap: 11 (ref 5–15)
BUN: 18 mg/dL (ref 8–23)
CO2: 17 mmol/L — ABNORMAL LOW (ref 22–32)
Calcium: 8 mg/dL — ABNORMAL LOW (ref 8.9–10.3)
Chloride: 110 mmol/L (ref 98–111)
Creatinine, Ser: 1.11 mg/dL (ref 0.61–1.24)
GFR, Estimated: >60 mL/min (ref >60)
Glucose, Bld: 93 mg/dL (ref 70–99)
Potassium: 3.3 mmol/L — ABNORMAL LOW (ref 3.5–5.1)
Sodium: 138 mmol/L (ref 135–145)

## 2022-10-18 LAB — CBC
HCT: 31.5 % — ABNORMAL LOW (ref 39.0–52.0)
Hemoglobin: 10.2 g/dL — ABNORMAL LOW (ref 13.0–17.0)
MCH: 28.2 pg (ref 26.0–34.0)
MCHC: 32.4 g/dL (ref 30.0–36.0)
MCV: 87 fL (ref 80.0–100.0)
Platelets: 244 10*3/uL (ref 150–400)
RBC: 3.62 MIL/uL — ABNORMAL LOW (ref 4.22–5.81)
RDW: 13.2 % (ref 11.5–15.5)
WBC: 7.9 10*3/uL (ref 4.0–10.5)
nRBC: 0 % (ref 0.0–0.2)

## 2022-10-18 LAB — PHOSPHORUS: Phosphorus: 2.9 mg/dL (ref 2.5–4.6)

## 2022-10-18 LAB — MAGNESIUM: Magnesium: 1.8 mg/dL (ref 1.7–2.4)

## 2022-10-18 MED ORDER — POTASSIUM CHLORIDE CRYS ER 20 MEQ PO TBCR
40.0000 meq | EXTENDED_RELEASE_TABLET | Freq: Once | ORAL | Status: AC
  Administered 2022-10-18: 40 meq via ORAL
  Filled 2022-10-18: qty 2

## 2022-10-18 NOTE — Progress Notes (Signed)
PROGRESS NOTE    Alan Meyers  ZOX:[id number] DOB: Mar 05, 1935 DOA: 10/16/2022 PCP: Donetta Potts, MD   Brief Narrative:  Alan Meyers is a 87 y.o. male with medical history significant of AAA, dementia, hyperlipidemia, hypertension, peripheral artery disease, and more presents the ED with a chief complaint of sore on foot.  Patient had revascularization earlier this year at [facility].  Patient is not able to provide any history due to his dementia.  Daughter at bedside reports that approximately 10 days ago patient started swelling in his foot.  She is not sure when it became more erythematous.  She reports she noticed drainage from the wound on the day of presentation.  It was malodorous.  Patient has not had any fever.  He has a decreased appetite, but that is not new for him.  Vascular and podiatry consulted.  Assessment & Plan:   Principal Problem:   Osteomyelitis (HCC) Active Problems:   Acute kidney injury superimposed on chronic kidney disease (HCC)   Hyperlipidemia   Peripheral artery disease (HCC)   Hypokalemia  Osteomyelitis (HCC) right front foot. -Patient on vancomycin and Rocephin.  Seen by vascular surgery.  They opined that there is no other vascular option to salvage his leg and they have recommended amputation.  Apparently patient unable to make decisions due to dementia.  They recommended palliative care which have consulted.  Evidently podiatry was also consulted yesterday but no note yet.   Acute kidney injury,  CKD ruled out: Baseline creatinine around 1.0, came in with a 1.38 with AKI and now improved and back to baseline.   Hyperlipidemia -Continue statin   Peripheral artery disease (HCC) -Holding plavix and aspirin for likely procedure  -Continue statin   Hypokalemia -Low again, will replenish.  DVT prophylaxis: enoxaparin (LOVENOX) injection 40 mg Start: 10/17/22 1800   Code Status: DNR  Family Communication:  None present at bedside.  Defer to  vascular surgery to discuss with the family about amputation.  Status is: Inpatient Remains inpatient appropriate because: Needs clarification/decision about amputation.   Estimated body mass index is 18.88 kg/m as calculated from the following:   Height as of this encounter: 5\' 9"  (1.753 m).   Weight as of this encounter: 58 kg.    Nutritional Assessment: Body mass index is 18.88 kg/m.Marland Kitchen Seen by dietician.  I agree with the assessment and plan as outlined below: Nutrition Status:        . Skin Assessment: I have examined the patient's skin and I agree with the wound assessment as performed by the wound care RN as outlined below:    Consultants:  Vascular surgery  Procedures:  As above  Antimicrobials:  Anti-infectives (From admission, onward)    Start     Dose/Rate Route Frequency Ordered Stop   10/18/22 0800  vancomycin (VANCOCIN) IVPB 1000 mg/200 mL premix  Status:  Discontinued        1,000 mg 200 mL/hr over 60 Minutes Intravenous Every 48 hours 10/16/22 2108 10/17/22 0802   10/17/22 2000  vancomycin (VANCOREADY) IVPB 750 mg/150 mL        750 mg 150 mL/hr over 60 Minutes Intravenous Every 24 hours 10/17/22 0802     10/16/22 2115  cefTRIAXone (ROCEPHIN) 2 g in sodium chloride 0.9 % 100 mL IVPB        2 g 200 mL/hr over 30 Minutes Intravenous Every 24 hours 10/16/22 2033     10/16/22 2115  vancomycin (VANCOREADY) IVPB 1250 mg/250 mL  Status:  Discontinued        1,250 mg 166.7 mL/hr over 90 Minutes Intravenous  Once 10/16/22 2100 10/16/22 2108   10/16/22 1800  vancomycin (VANCOCIN) IVPB 1000 mg/200 mL premix        1,000 mg 200 mL/hr over 60 Minutes Intravenous  Once 10/16/22 1752 10/16/22 2044         Subjective: Patient seen and examined.  Patient very sleepy and hard to arouse but eventually he woke up.  He was confused.  Unsure what his baseline is since he appears to have severe dementia.  Objective: Vitals:   10/17/22 1516 10/17/22 1953 10/18/22  0452 10/18/22 0753  BP: 113/69 125/77 (!) 142/123 (!) 144/69  Pulse: (!) 54 79 75 67  Resp: 16 17 16    Temp: 97.6 F (36.4 C) 98 F (36.7 C) 98.6 F (37 C) 99.3 F (37.4 C)  TempSrc: Oral     SpO2: 93% (!) 86% 100% 99%  Weight:      Height:        Intake/Output Summary (Last 24 hours) at 10/18/2022 0753 Last data filed at 10/17/2022 1209 Gross per 24 hour  Intake --  Output 200 ml  Net -200 ml   Filed Weights   10/16/22 1744  Weight: 58 kg    Examination:  General exam: Appears calm and comfortable  Respiratory system: Clear to auscultation. Respiratory effort normal. Cardiovascular system: S1 & S2 heard, RRR. No JVD, murmurs, rubs, gallops or clicks. No pedal edema. Gastrointestinal system: Abdomen is nondistended, soft and nontender. No organomegaly or masses felt. Normal bowel sounds heard. Central nervous system: Sleepy and not oriented.  No focal deficit. Extremities: Symmetric 5 x 5 power. Skin: No rashes, lesions or ulcers  Data Reviewed: I have personally reviewed following labs and imaging studies  CBC: Recent Labs  Lab 10/16/22 1815 10/17/22 0923 10/18/22 0041  WBC 9.4 8.4 7.9  NEUTROABS 7.4  --   --   HGB 11.3* 10.5* 10.2*  HCT 34.7* 32.3* 31.5*  MCV 88.5 86.4 87.0  PLT 252 232 244   Basic Metabolic Panel: Recent Labs  Lab 10/16/22 1815 10/17/22 0603 10/17/22 0923 10/18/22 0041  NA 135  --  136 138  K 3.3*  --  3.6 3.3*  CL 102  --  106 110  CO2 23  --  21* 17*  GLUCOSE 109*  --  95 93  BUN 34*  --  20 18  CREATININE 1.38* 1.19 1.07 1.11  CALCIUM 8.7*  --  8.1* 8.0*  MG  --   --  1.8 1.8  PHOS  --   --  2.6 2.9   GFR: Estimated Creatinine Clearance: 40.6 mL/min (by C-G formula based on SCr of 1.11 mg/dL). Liver Function Tests: Recent Labs  Lab 10/16/22 1815  AST 51*  ALT 48*  ALKPHOS 81  BILITOT 0.9  PROT 7.5  ALBUMIN 3.2*   No results for input(s): "LIPASE", "AMYLASE" in the last 168 hours. No results for input(s):  "AMMONIA" in the last 168 hours. Coagulation Profile: No results for input(s): "INR", "PROTIME" in the last 168 hours. Cardiac Enzymes: No results for input(s): "CKTOTAL", "CKMB", "CKMBINDEX", "TROPONINI" in the last 168 hours. BNP (last 3 results) No results for input(s): "PROBNP" in the last 8760 hours. HbA1C: No results for input(s): "HGBA1C" in the last 72 hours. CBG: No results for input(s): "GLUCAP" in the last 168 hours. Lipid Profile: No results for input(s): "CHOL", "HDL", "LDLCALC", "TRIG", "CHOLHDL", "LDLDIRECT"  in the last 72 hours. Thyroid Function Tests: No results for input(s): "TSH", "T4TOTAL", "FREET4", "T3FREE", "THYROIDAB" in the last 72 hours. Anemia Panel: No results for input(s): "VITAMINB12", "FOLATE", "FERRITIN", "TIBC", "IRON", "RETICCTPCT" in the last 72 hours. Sepsis Labs: Recent Labs  Lab 10/16/22 1815 10/16/22 1903  LATICACIDVEN 1.2 1.2    No results found for this or any previous visit (from the past 240 hour(s)).   Radiology Studies: VAS [place] ABI WITH/WO TBI  Result Date: 10/17/2022  LOWER EXTREMITY DOPPLER STUDY Patient Name:  Alan Meyers  Date of Exam:   10/17/2022 Medical Rec #: [id number]     Accession #:    [id number] Date of Birth: [birth year]    Patient Gender: M Patient Age:   41 years Exam Location:  [facility] Procedure:      VAS [place] ABI WITH/WO TBI Referring Phys: Heath Lark --------------------------------------------------------------------------------  Indications: Ulceration, and peripheral artery disease. High Risk Factors: Hypertension.  Vascular Interventions: 07/12/22 - RT SFA & PopA stenting                         07/07/22 - RT Great Toe amp. Limitations: Today's exam was limited due to patient positioning and Amputation. Comparison Study: ABI numbers improved since previous study, but no changes in                   waveforms. 08/22/22 Performing Technologist: Shona Simpson  Examination Guidelines: A complete evaluation includes  at minimum, Doppler waveform signals and systolic blood pressure reading at the level of bilateral brachial, anterior tibial, and posterior tibial arteries, when vessel segments are accessible. Bilateral testing is considered an integral part of a complete examination. Photoelectric Plethysmograph (PPG) waveforms and toe systolic pressure readings are included as required and additional duplex testing as needed. Limited examinations for reoccurring indications may be performed as noted.  ABI Findings: +---------+------------------+-----+----------+----------+ Right    Rt Pressure (mmHg)IndexWaveform  Comment    +---------+------------------+-----+----------+----------+ Brachial 151                    triphasic            +---------+------------------+-----+----------+----------+ PTA      145               0.96 monophasic           +---------+------------------+-----+----------+----------+ DP       137               0.91 monophasic           +---------+------------------+-----+----------+----------+ Great Toe                                 Amputation +---------+------------------+-----+----------+----------+ +---------+------------------+-----+----------+-------+ Left     Lt Pressure (mmHg)IndexWaveform  Comment +---------+------------------+-----+----------+-------+ Brachial 147                    triphasic         +---------+------------------+-----+----------+-------+ PTA      117               0.77 monophasic        +---------+------------------+-----+----------+-------+ DP       132               0.87 monophasic        +---------+------------------+-----+----------+-------+ Leanne Lovely  0.51 Abnormal          +---------+------------------+-----+----------+-------+ +-------+-----------+-----------+------------+------------+ ABI/TBIToday's ABIToday's TBIPrevious ABIPrevious TBI  +-------+-----------+-----------+------------+------------+ Right  0.96                  0.84                     +-------+-----------+-----------+------------+------------+ Left   0.87       0.51       0.62        0.12         +-------+-----------+-----------+------------+------------+ Bilateral ABIs appear increased compared to prior study on 08/22/22. Left TBIs appear increased compared to prior study on 08/22/22.  Summary: Right: Resting right ankle-brachial index is within normal range. Although ankle brachial indices are within normal limits (0.95-1.29), arterial Doppler waveforms at the ankle suggest some component of arterial occlusive disease. Left: Resting left ankle-brachial index indicates mild left lower extremity arterial disease. The left toe-brachial index is abnormal. Although ankle brachial indices are within the range of mild PAD, arterial Doppler waveforms at the ankle suggest some greater component of arterial occlusive disease. *See table(s) above for measurements and observations.  Electronically signed by Heath Lark on 10/17/2022 at 8:55:14 PM.    Final    VAS [place] LOWER EXTREMITY ARTERIAL DUPLEX  Result Date: 10/17/2022 LOWER EXTREMITY ARTERIAL DUPLEX STUDY Patient Name:  Alan Meyers  Date of Exam:   10/17/2022 Medical Rec #: [id number]     Accession #:    [id number] Date of Birth: 12-15-1935    Patient Gender: M Patient Age:   54 years Exam Location:  [facility] Procedure:      VAS [place] LOWER EXTREMITY ARTERIAL DUPLEX Referring Phys: Heath Lark --------------------------------------------------------------------------------  Indications: Ulceration, and peripheral artery disease. High Risk Factors: Hypertension, hyperlipidemia.  Vascular Interventions: 07/09/22 - RT SFA & PopA stnting                         07/07/22 - RT Great Toe amputation. Current ABI:            .96/.87 Limitations: Patient Positioning Comparison Study: No significant changes seen since prior study  08/22/22 Performing Technologist: Shona Simpson  Examination Guidelines: A complete evaluation includes B-mode imaging, spectral Doppler, color Doppler, and power Doppler as needed of all accessible portions of each vessel. Bilateral testing is considered an integral part of a complete examination. Limited examinations for reoccurring indications may be performed as noted.  +-----------+--------+-----+---------------+----------+--------+ RIGHT      PSV cm/sRatioStenosis       Waveform  Comments +-----------+--------+-----+---------------+----------+--------+ CFA Prox   120                         biphasic           +-----------+--------+-----+---------------+----------+--------+ CFA Distal 112                         biphasic           +-----------+--------+-----+---------------+----------+--------+ DFA        229          50-74% stenosisbiphasic           +-----------+--------+-----+---------------+----------+--------+ SFA Distal 98                          monophasic         +-----------+--------+-----+---------------+----------+--------+ POP Prox  172                         monophasic         +-----------+--------+-----+---------------+----------+--------+ POP Distal 104                         monophasic         +-----------+--------+-----+---------------+----------+--------+ ATA Distal 215          30-49% stenosismonophasic         +-----------+--------+-----+---------------+----------+--------+ PTA Distal 57                          monophasic         +-----------+--------+-----+---------------+----------+--------+ PERO [id number]                         monophasic         +-----------+--------+-----+---------------+----------+--------+  Right Stent(s): +-------------------+--------+---------------+----------+--------+ SFA Prox - SFA DistPSV cm/sStenosis       Waveform  Comments  +-------------------+--------+---------------+----------+--------+ Prox to Stent      131                    monophasic         +-------------------+--------+---------------+----------+--------+ Proximal Stent     265     50-99% stenosismonophasic         +-------------------+--------+---------------+----------+--------+ Mid Stent          153                    monophasic         +-------------------+--------+---------------+----------+--------+ Distal Stent       127                    monophasic         +-------------------+--------+---------------+----------+--------+ Distal to Stent    129                    monophasic         +-------------------+--------+---------------+----------+--------+  +---------------+--------+--------+----------+--------+ PopA Dist      PSV cm/sStenosisWaveform  Comments +---------------+--------+--------+----------+--------+ Prox to Stent  144             monophasic         +---------------+--------+--------+----------+--------+ Proximal Stent 166             monophasic         +---------------+--------+--------+----------+--------+ Mid Stent      114             monophasic         +---------------+--------+--------+----------+--------+ Distal Stent   114             monophasic         +---------------+--------+--------+----------+--------+ Distal to [id number]             monophasic         +---------------+--------+--------+----------+--------+   Summary: Right: 50-74% stenosis noted in the superficial femoral artery and/or popliteal artery. 30-49% stenosis noted in the anterior tibial artery. Stenosis is noted within the 50-99% stenosis noted in the proximal SFA stent, otherwise SFA stent appears patent.  stent. No significant change compared to previous study.  See table(s) above for measurements and observations. Electronically signed by Heath Lark on 10/17/2022 at 8:55:07 PM.    Final    MR FOOT RIGHT W WO  CONTRAST  Result Date: 10/16/2022 CLINICAL  DATA:  Infection. EXAM: MRI OF THE RIGHT FOREFOOT WITHOUT AND WITH CONTRAST TECHNIQUE: Multiplanar, multisequence MR imaging of the right forefoot was performed before and after the administration of intravenous contrast. CONTRAST:  6mL GADAVIST GADOBUTROL 1 MMOL/ML IV SOLN COMPARISON:  MRI right forefoot from [facility] hospital 07/05/2022 (images only; report unavailable); right foot radiographs 06/19/2022 and 07/04/2022 FINDINGS: Bones/Joint/Cartilage There is new absence of the distal 50% of the great toe metatarsal and great toe phalanges which is presumably secondary to interval amputation. There is high-grade edema and enhancement throughout the distal approximate 2 cm of the remaining first metatarsal with adjacent distal forefoot inflammatory phlegmon concerning for acute osteomyelitis. There is new moderate erosion of the second metatarsal head with scattered low signal foci throughout the metatarsal head which may represent foci of air (sagittal series 7, image 26). There is medial, proximal, and dorsal dislocation of the second toe phalanges with respect of the metatarsal head (coronal series 5, image 12). There is mild-to-moderate erosion of portions of the base of the proximal phalanx of the second toe, the third metatarsal head, and the base of the proximal phalanx of the third toe. There is moderate to high-grade marrow edema throughout the proximal 80% of the proximal phalanx of the second toe, the distal greater than proximal shaft of the second metatarsal diffusely, the distal 60% of the third metatarsal shaft, and the majority of the proximal phalanx of the third toe. Ligaments The Lisfranc ligament complex is grossly intact. Muscles and Tendons Postsurgical resection of the distal aspect of the flexor hallucis longus tendon. Diffuse moderate edema throughout the ventral and dorsal midfoot musculature, nonspecific myositis. Soft tissues There is an  ulcer plantar to the second metatarsal head measuring up to 8 mm in transverse dimension, 7 mm in craniocaudal depth, and 9 mm along the longitudinal axis of the foot (axial image 23 and sagittal image 25). There also are connected pockets of decreased T1 and decreased T2 signal with blooming artifact dorsal to the second and third tarsometatarsal joints that appears to represent air extending into a large dorsal soft tissue wound (sagittal images 17 through 33 and axial images 18 through 33). This wound does connect to the dorsal skin at the level of the space between the dorsal base of the third and fourth toes (axial series 4, image 24). There are multiple areas of decreased T1 and increased T2 signal with rim enhancement abscesses within the soft tissues plantar medial to the distal first ray soft tissue amputation site, medial to the second metatarsophalangeal joint, and wrapping around the second metatarsophalangeal joint. IMPRESSION: 1. Interval amputation of the first ray to the mid metatarsal shaft. There is high-grade edema and enhancement throughout the distal approximate 2 cm of the remaining first metatarsal with adjacent distal forefoot inflammatory phlegmon concerning for acute osteomyelitis. 2. There is new moderate erosion of the second metatarsal head with scattered low signal foci throughout the metatarsal head which may represent foci of air within the bone versus within wound packing material in the possible absence of the second metatarsal head. There is medial, proximal, and dorsal dislocation of the second toe phalanges with respect of the metatarsal head. 3. Findings concerning for acute osteomyelitis within the proximal 80% of the proximal phalanx of the second toe, the distal greater than proximal shaft of the second metatarsal diffusely, the distal 60% of the third metatarsal shaft, and the majority of the proximal phalanx of the third toe. These findings are concerning for acute  osteomyelitis. 4. There is an ulcer plantar to the second metatarsal head. 5. Large dorsal forefoot wound at the level of the second and third distal metatarsals. 6. Multiple abscesses wrapping around the second metatarsophalangeal joint and within the distal first ray amputation site extending along the plantar aspect of the remaining first metatarsal. Electronically Signed   By: Neita Garnet M.D.   On: 10/16/2022 20:59    Scheduled Meds:  enoxaparin (LOVENOX) injection  40 mg Subcutaneous QPM   potassium chloride  40 mEq Oral Once   QUEtiapine  25 mg Oral BID   Continuous Infusions:  sodium chloride 100 mL/hr at 10/16/22 2326   cefTRIAXone (ROCEPHIN)  IV 2 g (10/17/22 2138)   vancomycin 750 mg (10/17/22 2220)     LOS: 2 days   Hughie Closs, MD [facility]  10/18/2022, 7:53 AM   *Please note that this is a verbal dictation therefore any spelling or grammatical errors are due to the "[employer]agon [employer] One" system interpretation.  Please page via Amion and do not message via secure chat for urgent patient care matters. Secure chat can be used for non urgent patient care matters.  How to contact the [facility] Attending or Consulting provider 7A - 7P or covering provider during after hours 7P -7A, for this patient?  Check the care team in [facility] and look for a) attending/consulting [facility] provider listed and b) the [facility] team listed. Page or secure chat 7A-7P. Log into [email or web address] and use [place]'s universal password to access. If you do not have the password, please contact the hospital operator. Locate the [facility] provider you are looking for under [facility] and page to a number that you can be directly reached. If you still have difficulty reaching the provider, please page the [facility] (Director on Call) for the Hospitalists listed on amion for assistance.

## 2022-10-18 NOTE — Progress Notes (Signed)
Ok to replace k 3.3 with kdur x1 per Dr. Jacqulyn Bath.  Ulyses Southward, PharmD, BCIDP, AAHIVP, CPP Infectious Disease Pharmacist 10/18/2022 7:35 AM

## 2022-10-19 DIAGNOSIS — M86471 Chronic osteomyelitis with draining sinus, right ankle and foot: Secondary | ICD-10-CM | POA: Diagnosis not present

## 2022-10-19 DIAGNOSIS — M869 Osteomyelitis, unspecified: Secondary | ICD-10-CM | POA: Diagnosis not present

## 2022-10-19 LAB — CBC
HCT: 37.1 % — ABNORMAL LOW (ref 39.0–52.0)
Hemoglobin: 12.2 g/dL — ABNORMAL LOW (ref 13.0–17.0)
MCH: 28.6 pg (ref 26.0–34.0)
MCHC: 32.9 g/dL (ref 30.0–36.0)
MCV: 86.9 fL (ref 80.0–100.0)
Platelets: 300 10*3/uL (ref 150–400)
RBC: 4.27 MIL/uL (ref 4.22–5.81)
RDW: 13.3 % (ref 11.5–15.5)
WBC: 7.8 10*3/uL (ref 4.0–10.5)
nRBC: 0 % (ref 0.0–0.2)

## 2022-10-19 LAB — BASIC METABOLIC PANEL
Anion gap: 10 (ref 5–15)
BUN: 14 mg/dL (ref 8–23)
CO2: 20 mmol/L — ABNORMAL LOW (ref 22–32)
Calcium: 8.4 mg/dL — ABNORMAL LOW (ref 8.9–10.3)
Chloride: 109 mmol/L (ref 98–111)
Creatinine, Ser: 0.99 mg/dL (ref 0.61–1.24)
GFR, Estimated: >60 mL/min (ref >60)
Glucose, Bld: 106 mg/dL — ABNORMAL HIGH (ref 70–99)
Potassium: 3.6 mmol/L (ref 3.5–5.1)
Sodium: 139 mmol/L (ref 135–145)

## 2022-10-19 LAB — MAGNESIUM: Magnesium: 1.8 mg/dL (ref 1.7–2.4)

## 2022-10-19 LAB — PHOSPHORUS: Phosphorus: 3 mg/dL (ref 2.5–4.6)

## 2022-10-19 NOTE — Progress Notes (Signed)
Pt has made several attempts to get out of bed. Pt is unsteady on feet, confused, and is a fall risk. Pt has a Geographical information systems officer. Pt has very poor safety awareness. Nursing plan of care ongoing at this time,

## 2022-10-19 NOTE — Progress Notes (Signed)
PROGRESS NOTE    Alan Meyers  ZYS:[id number] DOB: [birth year] DOA: 10/16/2022 PCP: Donetta Potts, MD   Brief Narrative:  Alan Meyers is a 87 y.o. male with medical history significant of AAA, dementia, hyperlipidemia, hypertension, peripheral artery disease, and more presents the ED with a chief complaint of sore on foot.  Patient had revascularization earlier this year at [facility].  Patient is not able to provide any history due to his dementia.  Daughter at bedside reports that approximately 10 days ago patient started swelling in his foot.  She is not sure when it became more erythematous.  She reports she noticed drainage from the wound on the day of presentation.  It was malodorous.  Patient has not had any fever.  He has a decreased appetite, but that is not new for him.  Vascular and podiatry consulted.  Assessment & Plan:   Principal Problem:   Osteomyelitis (HCC) Active Problems:   Acute kidney injury superimposed on chronic kidney disease (HCC)   Hyperlipidemia   Peripheral artery disease (HCC)   Hypokalemia  Osteomyelitis (HCC) right front foot. -Patient on vancomycin and Rocephin.  Seen by vascular surgery.  They opined that there is no other vascular option to salvage his leg and they have recommended amputation.  Apparently patient unable to make decisions due to dementia.  They recommended palliative care which has been consulted.  Podiatry and vascular surgery has had a great length of discussion with the daughter and provided her with the options.  We are waiting for her decisions.   Acute kidney injury,  CKD ruled out: Baseline creatinine around 1.0, came in with a 1.38 with AKI and now improved and back to baseline.   Hyperlipidemia -Continue statin   Peripheral artery disease (HCC) -Holding plavix and aspirin for likely procedure  -Continue statin   Hypokalemia Resolved.  DVT prophylaxis: enoxaparin (LOVENOX) injection 40 mg Start: 10/17/22 1800    Code Status: DNR  Family Communication:  None present at bedside.  Defer to vascular surgery to discuss with the family about amputation.  Status is: Inpatient Remains inpatient appropriate because: Needs clarification/decision about amputation.  Needs evaluation by palliative care as well.   Estimated body mass index is 18.88 kg/m as calculated from the following:   Height as of this encounter: 5\' 9"  (1.753 m).   Weight as of this encounter: 58 kg.    Nutritional Assessment: Body mass index is 18.88 kg/m.Marland Kitchen Seen by dietician.  I agree with the assessment and plan as outlined below: Nutrition Status:        . Skin Assessment: I have examined the patient's skin and I agree with the wound assessment as performed by the wound care RN as outlined below:    Consultants:  Vascular surgery  Procedures:  As above  Antimicrobials:  Anti-infectives (From admission, onward)    Start     Dose/Rate Route Frequency Ordered Stop   10/18/22 0800  vancomycin (VANCOCIN) IVPB 1000 mg/200 mL premix  Status:  Discontinued        1,000 mg 200 mL/hr over 60 Minutes Intravenous Every 48 hours 10/16/22 2108 10/17/22 0802   10/17/22 2000  vancomycin (VANCOREADY) IVPB 750 mg/150 mL        750 mg 150 mL/hr over 60 Minutes Intravenous Every 24 hours 10/17/22 0802     10/16/22 2115  cefTRIAXone (ROCEPHIN) 2 g in sodium chloride 0.9 % 100 mL IVPB        2 g 200 mL/hr  over 30 Minutes Intravenous Every 24 hours 10/16/22 2033     10/16/22 2115  vancomycin (VANCOREADY) IVPB 1250 mg/250 mL  Status:  Discontinued        1,250 mg 166.7 mL/hr over 90 Minutes Intravenous  Once 10/16/22 2100 10/16/22 2108   10/16/22 1800  vancomycin (VANCOCIN) IVPB 1000 mg/200 mL premix        1,000 mg 200 mL/hr over 60 Minutes Intravenous  Once 10/16/22 1752 10/16/22 2044         Subjective: Seen and examined.  Patient more awake and oriented today compared to yesterday.  No complaints.  Cannot comprehend medical  complexity.  Objective: Vitals:   10/18/22 0753 10/18/22 1617 10/19/22 0518 10/19/22 0815  BP: (!) 144/69 (!) 140/90 130/63 132/76  Pulse: 67 (!) 109 99 75  Resp:   17 16  Temp: 99.3 F (37.4 C) 97.7 F (36.5 C) 97.6 F (36.4 C) 97.8 F (36.6 C)  TempSrc:  Oral Oral   SpO2: 99% 97% 100% 100%  Weight:      Height:        Intake/Output Summary (Last 24 hours) at 10/19/2022 0849 Last data filed at 10/18/2022 2245 Gross per 24 hour  Intake 583.85 ml  Output 300 ml  Net 283.85 ml   Filed Weights   10/16/22 1744  Weight: 58 kg    Examination:  General exam: Appears calm and comfortable  Respiratory system: Clear to auscultation. Respiratory effort normal. Cardiovascular system: S1 & S2 heard, RRR. No JVD, murmurs, rubs, gallops or clicks. No pedal edema. Gastrointestinal system: Abdomen is nondistended, soft and nontender. No organomegaly or masses felt. Normal bowel sounds heard. Central nervous system: Sleepy and not oriented.  No focal deficit. Extremities: Symmetric 5 x 5 power. Skin: No rashes, lesions or ulcers  Data Reviewed: I have personally reviewed following labs and imaging studies  CBC: Recent Labs  Lab 10/16/22 1815 10/17/22 0923 10/18/22 0041  WBC 9.4 8.4 7.9  NEUTROABS 7.4  --   --   HGB 11.3* 10.5* 10.2*  HCT 34.7* 32.3* 31.5*  MCV 88.5 86.4 87.0  PLT 252 232 244   Basic Metabolic Panel: Recent Labs  Lab 10/16/22 1815 10/17/22 0603 10/17/22 0923 10/18/22 0041 10/19/22 0705  NA 135  --  136 138 139  K 3.3*  --  3.6 3.3* 3.6  CL 102  --  106 110 109  CO2 23  --  21* 17* 20*  GLUCOSE 109*  --  95 93 106*  BUN 34*  --  20 18 14   CREATININE 1.38* 1.19 1.07 1.11 0.99  CALCIUM 8.7*  --  8.1* 8.0* 8.4*  MG  --   --  1.8 1.8 1.8  PHOS  --   --  2.6 2.9 3.0   GFR: Estimated Creatinine Clearance: 45.6 mL/min (by C-G formula based on SCr of 0.99 mg/dL). Liver Function Tests: Recent Labs  Lab 10/16/22 1815  AST 51*  ALT 48*  ALKPHOS 81   BILITOT 0.9  PROT 7.5  ALBUMIN 3.2*   No results for input(s): "LIPASE", "AMYLASE" in the last 168 hours. No results for input(s): "AMMONIA" in the last 168 hours. Coagulation Profile: No results for input(s): "INR", "PROTIME" in the last 168 hours. Cardiac Enzymes: No results for input(s): "CKTOTAL", "CKMB", "CKMBINDEX", "TROPONINI" in the last 168 hours. BNP (last 3 results) No results for input(s): "PROBNP" in the last 8760 hours. HbA1C: No results for input(s): "HGBA1C" in the last 72 hours. CBG:  No results for input(s): "GLUCAP" in the last 168 hours. Lipid Profile: No results for input(s): "CHOL", "HDL", "LDLCALC", "TRIG", "CHOLHDL", "LDLDIRECT" in the last 72 hours. Thyroid Function Tests: No results for input(s): "TSH", "T4TOTAL", "FREET4", "T3FREE", "THYROIDAB" in the last 72 hours. Anemia Panel: No results for input(s): "VITAMINB12", "FOLATE", "FERRITIN", "TIBC", "IRON", "RETICCTPCT" in the last 72 hours. Sepsis Labs: Recent Labs  Lab 10/16/22 1815 10/16/22 1903  LATICACIDVEN 1.2 1.2    No results found for this or any previous visit (from the past 240 hour(s)).   Radiology Studies: VAS [place] ABI WITH/WO TBI  Result Date: 10/17/2022  LOWER EXTREMITY DOPPLER STUDY Patient Name:  AMAHRI BONGO  Date of Exam:   10/17/2022 Medical Rec #: [id number]     Accession #:    [id number] Date of Birth: 01/17/1936    Patient Gender: M Patient Age:   40 years Exam Location:  [facility] Procedure:      VAS [place] ABI WITH/WO TBI Referring Phys: Heath Lark --------------------------------------------------------------------------------  Indications: Ulceration, and peripheral artery disease. High Risk Factors: Hypertension.  Vascular Interventions: 07/12/22 - RT SFA & PopA stenting                         07/07/22 - RT Great Toe amp. Limitations: Today's exam was limited due to patient positioning and Amputation. Comparison Study: ABI numbers improved since previous study, but no  changes in                   waveforms. 08/22/22 Performing Technologist: Shona Simpson  Examination Guidelines: A complete evaluation includes at minimum, Doppler waveform signals and systolic blood pressure reading at the level of bilateral brachial, anterior tibial, and posterior tibial arteries, when vessel segments are accessible. Bilateral testing is considered an integral part of a complete examination. Photoelectric Plethysmograph (PPG) waveforms and toe systolic pressure readings are included as required and additional duplex testing as needed. Limited examinations for reoccurring indications may be performed as noted.  ABI Findings: +---------+------------------+-----+----------+----------+ Right    Rt Pressure (mmHg)IndexWaveform  Comment    +---------+------------------+-----+----------+----------+ Brachial 151                    triphasic            +---------+------------------+-----+----------+----------+ PTA      145               0.96 monophasic           +---------+------------------+-----+----------+----------+ DP       137               0.91 monophasic           +---------+------------------+-----+----------+----------+ Great Toe                                 Amputation +---------+------------------+-----+----------+----------+ +---------+------------------+-----+----------+-------+ Left     Lt Pressure (mmHg)IndexWaveform  Comment +---------+------------------+-----+----------+-------+ Brachial 147                    triphasic         +---------+------------------+-----+----------+-------+ PTA      117               0.77 monophasic        +---------+------------------+-----+----------+-------+ DP       132               0.87  monophasic        +---------+------------------+-----+----------+-------+ Great Toe77                0.51 Abnormal          +---------+------------------+-----+----------+-------+  +-------+-----------+-----------+------------+------------+ ABI/TBIToday's ABIToday's TBIPrevious ABIPrevious TBI +-------+-----------+-----------+------------+------------+ Right  0.96                  0.84                     +-------+-----------+-----------+------------+------------+ Left   0.87       0.51       0.62        0.12         +-------+-----------+-----------+------------+------------+ Bilateral ABIs appear increased compared to prior study on 08/22/22. Left TBIs appear increased compared to prior study on 08/22/22.  Summary: Right: Resting right ankle-brachial index is within normal range. Although ankle brachial indices are within normal limits (0.95-1.29), arterial Doppler waveforms at the ankle suggest some component of arterial occlusive disease. Left: Resting left ankle-brachial index indicates mild left lower extremity arterial disease. The left toe-brachial index is abnormal. Although ankle brachial indices are within the range of mild PAD, arterial Doppler waveforms at the ankle suggest some greater component of arterial occlusive disease. *See table(s) above for measurements and observations.  Electronically signed by Heath Lark on 10/17/2022 at 8:55:14 PM.    Final    VAS [place] LOWER EXTREMITY ARTERIAL DUPLEX  Result Date: 10/17/2022 LOWER EXTREMITY ARTERIAL DUPLEX STUDY Patient Name:  PIERSEN HOGENSON  Date of Exam:   10/17/2022 Medical Rec #: [id number]     Accession #:    [id number] Date of Birth: October 13, 1935    Patient Gender: M Patient Age:   70 years Exam Location:  [facility] Procedure:      VAS [place] LOWER EXTREMITY ARTERIAL DUPLEX Referring Phys: Heath Lark --------------------------------------------------------------------------------  Indications: Ulceration, and peripheral artery disease. High Risk Factors: Hypertension, hyperlipidemia.  Vascular Interventions: 07/09/22 - RT SFA & PopA stnting                         07/07/22 - RT Great Toe amputation. Current  ABI:            .96/.87 Limitations: Patient Positioning Comparison Study: No significant changes seen since prior study 08/22/22 Performing Technologist: Shona Simpson  Examination Guidelines: A complete evaluation includes B-mode imaging, spectral Doppler, color Doppler, and power Doppler as needed of all accessible portions of each vessel. Bilateral testing is considered an integral part of a complete examination. Limited examinations for reoccurring indications may be performed as noted.  +-----------+--------+-----+---------------+----------+--------+ RIGHT      PSV cm/sRatioStenosis       Waveform  Comments +-----------+--------+-----+---------------+----------+--------+ CFA Prox   120                         biphasic           +-----------+--------+-----+---------------+----------+--------+ CFA Distal 112                         biphasic           +-----------+--------+-----+---------------+----------+--------+ DFA        229          50-74% stenosisbiphasic           +-----------+--------+-----+---------------+----------+--------+ SFA Distal 98  monophasic         +-----------+--------+-----+---------------+----------+--------+ POP Prox   172                         monophasic         +-----------+--------+-----+---------------+----------+--------+ POP Distal 104                         monophasic         +-----------+--------+-----+---------------+----------+--------+ ATA Distal 215          30-49% stenosismonophasic         +-----------+--------+-----+---------------+----------+--------+ PTA Distal 57                          monophasic         +-----------+--------+-----+---------------+----------+--------+ PERO [id number]                         monophasic         +-----------+--------+-----+---------------+----------+--------+  Right Stent(s): +-------------------+--------+---------------+----------+--------+  SFA Prox - SFA DistPSV cm/sStenosis       Waveform  Comments +-------------------+--------+---------------+----------+--------+ Prox to Stent      131                    monophasic         +-------------------+--------+---------------+----------+--------+ Proximal Stent     265     50-99% stenosismonophasic         +-------------------+--------+---------------+----------+--------+ Mid Stent          153                    monophasic         +-------------------+--------+---------------+----------+--------+ Distal Stent       127                    monophasic         +-------------------+--------+---------------+----------+--------+ Distal to Stent    129                    monophasic         +-------------------+--------+---------------+----------+--------+  +---------------+--------+--------+----------+--------+ PopA Dist      PSV cm/sStenosisWaveform  Comments +---------------+--------+--------+----------+--------+ Prox to Stent  144             monophasic         +---------------+--------+--------+----------+--------+ Proximal Stent 166             monophasic         +---------------+--------+--------+----------+--------+ Mid Stent      114             monophasic         +---------------+--------+--------+----------+--------+ Distal Stent   114             monophasic         +---------------+--------+--------+----------+--------+ Distal to [id number]             monophasic         +---------------+--------+--------+----------+--------+   Summary: Right: 50-74% stenosis noted in the superficial femoral artery and/or popliteal artery. 30-49% stenosis noted in the anterior tibial artery. Stenosis is noted within the 50-99% stenosis noted in the proximal SFA stent, otherwise SFA stent appears patent.  stent. No significant change compared to previous study.  See table(s) above for measurements and observations. Electronically signed by Heath Lark on 10/17/2022 at 8:55:07 PM.    Final  Scheduled Meds:  enoxaparin (LOVENOX) injection  40 mg Subcutaneous QPM   QUEtiapine  25 mg Oral BID   Continuous Infusions:  sodium chloride 100 mL/hr at 10/19/22 0226   cefTRIAXone (ROCEPHIN)  IV 2 g (10/18/22 2004)   vancomycin 750 mg (10/18/22 2117)     LOS: 3 days   Hughie Closs, MD [facility]  10/19/2022, 8:49 AM   *Please note that this is a verbal dictation therefore any spelling or grammatical errors are due to the "Dragon Medical One" system interpretation.  Please page via Amion and do not message via secure chat for urgent patient care matters. Secure chat can be used for non urgent patient care matters.  How to contact the [facility] Attending or Consulting provider 7A - 7P or covering provider during after hours 7P -7A, for this patient?  Check the care team in [facility] and look for a) attending/consulting TRH provider listed and b) the [facility] team listed. Page or secure chat 7A-7P. Log into [email or web address] and use [place]'s universal password to access. If you do not have the password, please contact the hospital operator. Locate the [facility] provider you are looking for under [facility] and page to a number that you can be directly reached. If you still have difficulty reaching the provider, please page the [facility] (Director on Call) for the Hospitalists listed on amion for assistance.

## 2022-10-19 NOTE — Progress Notes (Signed)
  Subjective:  Patient ID: Alan Meyers, male    DOB: Jan 11, 1936,  MRN: [id number]  Patient seen at [facility] still confused.   Unable to complete ROS due to confusion Objective:   Vitals:   10/19/22 1659 10/19/22 1700  BP: (!) 145/85   Pulse: 74   Resp: 18   Temp:    SpO2: (!) 86% 92%   General AA&O x3. Normal mood and affect.  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 Normal joint ROM without pain or crepitus.    Assessment & Plan:  Patient was evaluated and treated and all questions answered.  Osteomyelitis, right foot infection -Unfortunately has new ulceration and not improving. I reviewed his previously vascular studies as well as the new ones. He has re-stenosis of the stent with no salvage options. I discussed w/ his daughter by phone. They are planning for home hospice. Even if he could heal a midfoot amputation, likely would be high risk of re-ulceration. She has discussed with the other children and feel he would not want that, and would not want limb amputation. I agree w/ their decision for hospice care -Suppressive antibiotics long term are not unreasonable to stay on Augmentin or cephalexin but eventually will no long control infection but may offer some prolonged comfort and symptomatic relief from the infection -We will sign off at this point, he may follow up with [place] in the office as needed -Order for dressings placed daily, can change at home as well to control drainage  Edwin Cap, DPM  Accessible via secure chat for questions or concerns.

## 2022-10-19 NOTE — Plan of Care (Signed)

## 2022-10-19 NOTE — Care Management Important Message (Signed)
Important Message  Patient Details  Name: Alan Meyers MRN: [id number] Date of Birth: 1936-01-27   Medicare Important Message Given:  Yes     Vedha Tercero 10/19/2022, 4:00 PM

## 2022-10-19 NOTE — TOC Progression Note (Signed)
Transition of Care (TOC) - Progression Note  Donn Pierini RN, BSN Transitions of Care Unit 4E- RN Case Manager See Treatment Team for direct phone # Cross coverage 2W  Patient Details  Name: Alan Meyers MRN: [id number] Date of Birth: 1935/09/12  Transition of Care [facility]) CM/SW Contact  Zenda Alpers, Lenn Sink, RN Phone Number: 10/19/2022, 2:17 PM  Clinical Narrative:    Request received from Palliative MD for Home Hospice choice, Family at bedside- CM spoke with family outside of room per family request to discuss Home Hospice needs.  Choice provided per CMS guidelines from [employer]ancing[employer]pl website with star ratings (copy placed in shadow chart) per family they would like to use [facility] for Hospice needs.  Discussed potential DME needs- per daughter Gunnar Fusi  pt has RW, BSC and sh. Chair at home. Would like to see if pt can stay in his own bed for now- no hospital bed requested at this time.  Family feels they can transport pt home via private vehicle.   Referral called to [facility]- spoke with Vivi Martens- liaison will contact daughter Gunnar Fusi this afternoon to review Hospice services, liaison will also review pt for Hospice eligibility.  Per family they would like to plan for transition home tomorrow.   Confirmed Daughter Gunnar Fusi is primary [phone]   Expected Discharge Plan: Home w Hospice Care Barriers to Discharge: Continued Medical Work up  Expected Discharge Plan and Services   Discharge Planning Services: CM Consult Post Acute Care Choice: Hospice Living arrangements for the past 2 months: Single Family Home                 DME Arranged: N/A DME Agency: NA       HH Arranged: Disease Management HH Agency: [facility] Date HH Agency Contacted: 10/19/22 Time HH Agency Contacted: 1417 Representative spoke with at [facility] Agency: [facility]i Martens   Social Determinants of Health (SDOH) Interventions SDOH Screenings    Food Insecurity: Patient Unable To Answer (10/16/2022)  Housing: Patient Unable To Answer (10/16/2022)  Transportation Needs: No Transportation Needs (10/16/2022)  Utilities: Not At Risk (10/16/2022)  Tobacco Use: Medium Risk (10/16/2022)    Readmission Risk Interventions    10/17/2022   11:13 AM  Readmission Risk Prevention Plan  Transportation Screening Complete  PCP or Specialist Appt within 5-7 Days Complete  Home Care Screening Complete  Medication Review (RN CM) Complete

## 2022-10-20 DIAGNOSIS — M86471 Chronic osteomyelitis with draining sinus, right ankle and foot: Secondary | ICD-10-CM | POA: Diagnosis not present

## 2022-10-20 LAB — CBC
HCT: 37.8 % — ABNORMAL LOW (ref 39.0–52.0)
Hemoglobin: 12.2 g/dL — ABNORMAL LOW (ref 13.0–17.0)
MCH: 27.9 pg (ref 26.0–34.0)
MCHC: 32.3 g/dL (ref 30.0–36.0)
MCV: 86.5 fL (ref 80.0–100.0)
Platelets: 325 10*3/uL (ref 150–400)
RBC: 4.37 MIL/uL (ref 4.22–5.81)
RDW: 13.3 % (ref 11.5–15.5)
WBC: 6.5 10*3/uL (ref 4.0–10.5)
nRBC: 0 % (ref 0.0–0.2)

## 2022-10-20 LAB — BASIC METABOLIC PANEL
Anion gap: 11 (ref 5–15)
BUN: 11 mg/dL (ref 8–23)
CO2: 19 mmol/L — ABNORMAL LOW (ref 22–32)
Calcium: 8.1 mg/dL — ABNORMAL LOW (ref 8.9–10.3)
Chloride: 108 mmol/L (ref 98–111)
Creatinine, Ser: 0.89 mg/dL (ref 0.61–1.24)
GFR, Estimated: >60 mL/min (ref >60)
Glucose, Bld: 94 mg/dL (ref 70–99)
Potassium: 3.2 mmol/L — ABNORMAL LOW (ref 3.5–5.1)
Sodium: 138 mmol/L (ref 135–145)

## 2022-10-20 LAB — PHOSPHORUS: Phosphorus: 2.7 mg/dL (ref 2.5–4.6)

## 2022-10-20 LAB — MAGNESIUM: Magnesium: 1.8 mg/dL (ref 1.7–2.4)

## 2022-10-20 MED ORDER — HALOPERIDOL LACTATE 2 MG/ML PO CONC: 5.0000 mg | Freq: Four times a day (QID) | ORAL | 0 refills | Status: DC | PRN

## 2022-10-20 MED ORDER — POTASSIUM CHLORIDE CRYS ER 20 MEQ PO TBCR
40.0000 meq | EXTENDED_RELEASE_TABLET | ORAL | Status: AC
  Administered 2022-10-20 (×2): 40 meq via ORAL
  Filled 2022-10-20 (×2): qty 2

## 2022-10-20 MED ORDER — CEPHALEXIN 500 MG PO CAPS: 500.0000 mg | ORAL_CAPSULE | Freq: Three times a day (TID) | ORAL | 0 refills | Status: AC

## 2022-10-20 MED ORDER — QUETIAPINE FUMARATE 25 MG PO TABS: 25.0000 mg | ORAL_TABLET | Freq: Every day | ORAL | 0 refills | Status: AC

## 2022-10-20 NOTE — TOC Transition Note (Addendum)
Transition of Care [facility]) - CM/SW Discharge Note   Patient Details  Name: Alan Meyers MRN: [id number] Date of Birth: 1936-02-14  Transition of Care [facility]) CM/SW Contact:  Lawerance Sabal, RN Phone Number: 10/20/2022, 9:44 AM   Clinical Narrative:     Pedro Earls patient's daughter Daleen Snook [phone]  has confirmed plans for Home Hospice, no DME needs, transport home by car, and that [facility] has reached out to her.  LVM w answering service at [facility]) to notify of DC today. Spoke w On call provider and confirmed they are aware of DC.  Patient currently documented on RA.  No other TOC needs identified at this time.   Final next level of care: Home w Hospice Care Barriers to Discharge: No Barriers Identified   Patient Goals and CMS Choice CMS Medicare.gov Compare Post Acute Care list provided to:: Patient Represenative (must comment) (daughter present at the bedside) Choice offered to / list presented to : Adult Children  Discharge Placement                         Discharge Plan and Services Additional resources added to the After Visit Summary for     Discharge Planning Services: CM Consult Post Acute Care Choice: Hospice          DME Arranged: N/A DME Agency: NA       HH Arranged: Disease Management HH Agency: [facility] Date HH Agency Contacted: 10/20/22 Time HH Agency Contacted: [phone] Representative spoke with at [facility] Agency: Answering service/ Admin on call  Social Determinants of Health (SDOH) Interventions SDOH Screenings   Food Insecurity: Patient Unable To Answer (10/16/2022)  Housing: Patient Unable To Answer (10/16/2022)  Transportation Needs: No Transportation Needs (10/16/2022)  Utilities: Not At Risk (10/16/2022)  Tobacco Use: Medium Risk (10/16/2022)     Readmission Risk Interventions    10/17/2022   11:13 AM  Readmission Risk Prevention Plan  Transportation Screening Complete  PCP or Specialist Appt within 5-7 Days  Complete  Home Care Screening Complete  Medication Review (RN CM) Complete

## 2022-10-20 NOTE — Progress Notes (Signed)
Dsg supplies given to daughter (at bedside).

## 2022-10-20 NOTE — Progress Notes (Signed)
Pt discharged to home. Discharge instructions completed by Tammy, RN (SWOT). Left unit in wheelchair pushed by nurse tech. Left in stable condition.

## 2022-10-20 NOTE — Discharge Summary (Addendum)
Physician Discharge Summary  Alan Meyers ZOX:[id number] DOB: [birth year] DOA: 10/16/2022  PCP: Alan Potts, MD  Admit date: 10/16/2022 Discharge date: 10/20/2022 30 Day Unplanned Readmission Risk Score    Flowsheet Row ED to Hosp-Admission (Current) from 10/16/2022 in [place] [facility]  30 Day Unplanned Readmission Risk Score (%) 20.56 Filed at 10/20/2022 0800       This score is the patient's risk of an unplanned readmission within 30 days of being discharged (0 -100%). The score is based on dignosis, age, lab data, medications, orders, and past utilization.   Low:  0-14.9   Medium: 15-21.9   High: 22-29.9   Extreme: 30 and above          Admitted From: Home Disposition: Home with hospice  Recommendations for Outpatient Follow-up:  Follow up with PCP in 1-2 weeks Please obtain BMP/CBC in one week Please follow up with your PCP on the following pending results: Unresulted Labs (From admission, onward)     Start     Ordered   10/18/22 0500  Basic metabolic panel  Daily,   R      10/17/22 0841   10/18/22 0500  CBC  Daily,   R      10/17/22 0841   10/17/22 0500  Creatinine, serum  Every Mon-Wed-Fri (0500),   R      10/16/22 2108   Signed and Held  Comprehensive metabolic panel  Tomorrow morning,   R        Signed and Held   Signed and Held  Magnesium  Tomorrow morning,   R        Signed and Held   Signed and Held  CBC with Differential/Platelet  Tomorrow morning,   R        Signed and Held              Home Health: None Equipment/Devices: Multiple DME  Discharge Condition: Poor CODE STATUS: DNR Diet recommendation: As pleased by patient  Subjective: Seen and examined.  Remains confused but comfortable.  Brief/Interim Summary: Alan Meyers is a 87 y.o. male with medical history significant of AAA, dementia, hyperlipidemia, hypertension, peripheral artery disease, and more presented to the ED with a chief complaint of sore on foot.  Patient had  revascularization earlier this year at [facility].  Daughter reports that approximately 10 days ago patient started swelling in his foot.  She is not sure when it became more erythematous.  She reports she noticed drainage from the wound on the day of presentation.  It was malodorous.  Patient has not had any fever.  He has a decreased appetite, but that is not new for him.  Patient was diagnosed with osteomyelitis of the right front foot.  Vascular and podiatry consulted.  Patient started on vancomycin and Rocephin.  Both consultants opined that there is no other vascular option to salvage his leg and they have recommended amputation.  Apparently patient unable to make decisions due to dementia.  They recommended palliative care and then ended up having detailed discussion with the daughter about all the options mentioning that amputation might not be in the best interest of the patient due to his dementia and the wound may not heal either and recommended comfort care.  Family discussed the situation amongst themselves and decided to pursue hospice at home.  They met with hospice liaison yesterday and everything is all set up for them to be discharged today.  Family and podiatry wanted  patient to be on prolonged antibiotics to provide him as much time as possible and for that reason, I am discharging him on 30 days of Keflex.  Antibiotics can be discontinued earlier if desired.   Acute kidney injury,  CKD ruled out: Baseline creatinine around 1.0, came in with a 1.38 with AKI and now improved and back to baseline.   Hyperlipidemia -Continue statin   Peripheral artery disease (HCC) Resume aspirin Plavix and statin.   Hypokalemia Low again, will be replenished before discharge.  Delirium in a patient with dementia: Patient has intermittent delirium and currently he is delirious as well.  Hopefully this will improve at his family environment at home.  I educated family personally yesterday about the  measures that they can do to prevent delirium at home.  Per daughter's request, I prescribed him Seroquel and as needed Haldol liquid form.  Discharge plan was discussed with patient and/or family member and they verbalized understanding and agreed with it.  Discharge Diagnoses:  Principal Problem:   Osteomyelitis (HCC) Active Problems:   Acute kidney injury superimposed on chronic kidney disease (HCC)   Hyperlipidemia   Peripheral artery disease (HCC)   Hypokalemia    Discharge Instructions   Allergies as of 10/20/2022   No Known Allergies      Medication List     TAKE these medications    aspirin 81 MG tablet Take 81 mg by mouth daily.   cephALEXin 500 MG capsule Commonly known as: KEFLEX Take 1 capsule (500 mg total) by mouth 3 (three) times daily.   clopidogrel 75 MG tablet Commonly known as: PLAVIX Take 1 tablet (75 mg total) by mouth daily.   FeroSul 325 (65 Fe) MG tablet Generic drug: ferrous sulfate Take 325 mg by mouth daily.   haloperidol 2 MG/ML solution Commonly known as: HALDOL Take 2.5 mLs (5 mg total) by mouth every 6 (six) hours as needed for agitation.   QUEtiapine 25 MG tablet Commonly known as: SEROquel Take 1 tablet (25 mg total) by mouth at bedtime.   rosuvastatin 5 MG tablet Commonly known as: CRESTOR Take 1 tablet (5 mg total) by mouth daily.   VITAMIN D-3 PO Take 25 mcg by mouth daily.        Follow-up Information     [place] Follow up.   Why: Alan Meyers)- Hospice referral made- they will contact you to follow up on Home Hospice needs Contact information: [address] [place] [address] [phone]         Alan Potts, MD Follow up in 1 week(s).   Specialty: Internal Medicine Contact information: [address] [place] [address] [phone]                No Known Allergies  Consultations: Podiatry and vascular surgery   Procedures/Studies: VAS [place] ABI WITH/WO TBI  Result  Date: 10/17/2022  LOWER EXTREMITY DOPPLER STUDY Patient Name:  Alan Meyers  Date of Exam:   10/17/2022 Medical Rec #: [id number]     Accession #:    [id number] Date of Birth: [birth year]    Patient Gender: M Patient Age:   87 years Exam Location:  [facility] Procedure:      VAS [place] ABI WITH/WO TBI Referring Phys: Heath Lark --------------------------------------------------------------------------------  Indications: Ulceration, and peripheral artery disease. High Risk Factors: Hypertension.  Vascular Interventions: 07/12/22 - RT SFA & PopA stenting  07/07/22 - RT Great Toe amp. Limitations: Today's exam was limited due to patient positioning and Amputation. Comparison Study: ABI numbers improved since previous study, but no changes in                   waveforms. 08/22/22 Performing Technologist: Shona Simpson  Examination Guidelines: A complete evaluation includes at minimum, Doppler waveform signals and systolic blood pressure reading at the level of bilateral brachial, anterior tibial, and posterior tibial arteries, when vessel segments are accessible. Bilateral testing is considered an integral part of a complete examination. Photoelectric Plethysmograph (PPG) waveforms and toe systolic pressure readings are included as required and additional duplex testing as needed. Limited examinations for reoccurring indications may be performed as noted.  ABI Findings: +---------+------------------+-----+----------+----------+ Right    Rt Pressure (mmHg)IndexWaveform  Comment    +---------+------------------+-----+----------+----------+ Brachial 151                    triphasic            +---------+------------------+-----+----------+----------+ PTA      145               0.96 monophasic           +---------+------------------+-----+----------+----------+ DP       137               0.91 monophasic           +---------+------------------+-----+----------+----------+  Great Toe                                 Amputation +---------+------------------+-----+----------+----------+ +---------+------------------+-----+----------+-------+ Left     Lt Pressure (mmHg)IndexWaveform  Comment +---------+------------------+-----+----------+-------+ Brachial 147                    triphasic         +---------+------------------+-----+----------+-------+ PTA      117               0.77 monophasic        +---------+------------------+-----+----------+-------+ DP       132               0.87 monophasic        +---------+------------------+-----+----------+-------+ Great Toe77                0.51 Abnormal          +---------+------------------+-----+----------+-------+ +-------+-----------+-----------+------------+------------+ ABI/TBIToday's ABIToday's TBIPrevious ABIPrevious TBI +-------+-----------+-----------+------------+------------+ Right  0.96                  0.84                     +-------+-----------+-----------+------------+------------+ Left   0.87       0.51       0.62        0.12         +-------+-----------+-----------+------------+------------+ Bilateral ABIs appear increased compared to prior study on 08/22/22. Left TBIs appear increased compared to prior study on 08/22/22.  Summary: Right: Resting right ankle-brachial index is within normal range. Although ankle brachial indices are within normal limits (0.95-1.29), arterial Doppler waveforms at the ankle suggest some component of arterial occlusive disease. Left: Resting left ankle-brachial index indicates mild left lower extremity arterial disease. The left toe-brachial index is abnormal. Although ankle brachial indices are within the range of mild PAD, arterial Doppler waveforms at the ankle suggest some greater component of arterial occlusive disease. *See  table(s) above for measurements and observations.  Electronically signed by Heath Lark on 10/17/2022 at 8:55:14  PM.    Final    VAS [place] LOWER EXTREMITY ARTERIAL DUPLEX  Result Date: 10/17/2022 LOWER EXTREMITY ARTERIAL DUPLEX STUDY Patient Name:  LISA RASK  Date of Exam:   10/17/2022 Medical Rec #: [id number]     Accession #:    [id number] Date of Birth: 1935/07/20    Patient Gender: M Patient Age:   64 years Exam Location:  [facility] Procedure:      VAS [place] LOWER EXTREMITY ARTERIAL DUPLEX Referring Phys: Heath Lark --------------------------------------------------------------------------------  Indications: Ulceration, and peripheral artery disease. High Risk Factors: Hypertension, hyperlipidemia.  Vascular Interventions: 07/09/22 - RT SFA & PopA stnting                         07/07/22 - RT Great Toe amputation. Current ABI:            .96/.87 Limitations: Patient Positioning Comparison Study: No significant changes seen since prior study 08/22/22 Performing Technologist: Shona Simpson  Examination Guidelines: A complete evaluation includes B-mode imaging, spectral Doppler, color Doppler, and power Doppler as needed of all accessible portions of each vessel. Bilateral testing is considered an integral part of a complete examination. Limited examinations for reoccurring indications may be performed as noted.  +-----------+--------+-----+---------------+----------+--------+ RIGHT      PSV cm/sRatioStenosis       Waveform  Comments +-----------+--------+-----+---------------+----------+--------+ CFA Prox   120                         biphasic           +-----------+--------+-----+---------------+----------+--------+ CFA Distal 112                         biphasic           +-----------+--------+-----+---------------+----------+--------+ DFA        229          50-74% stenosisbiphasic           +-----------+--------+-----+---------------+----------+--------+ SFA Distal 98                          monophasic         +-----------+--------+-----+---------------+----------+--------+  POP Prox   172                         monophasic         +-----------+--------+-----+---------------+----------+--------+ POP Distal 104                         monophasic         +-----------+--------+-----+---------------+----------+--------+ ATA Distal 215          30-49% stenosismonophasic         +-----------+--------+-----+---------------+----------+--------+ PTA Distal 57                          monophasic         +-----------+--------+-----+---------------+----------+--------+ PERO [id number]                         monophasic         +-----------+--------+-----+---------------+----------+--------+  Right Stent(s): +-------------------+--------+---------------+----------+--------+ SFA Prox - SFA DistPSV cm/sStenosis       Waveform  Comments +-------------------+--------+---------------+----------+--------+ Prox to Stent  131                    monophasic         +-------------------+--------+---------------+----------+--------+ Proximal Stent     265     50-99% stenosismonophasic         +-------------------+--------+---------------+----------+--------+ Mid Stent          153                    monophasic         +-------------------+--------+---------------+----------+--------+ Distal Stent       127                    monophasic         +-------------------+--------+---------------+----------+--------+ Distal to Stent    129                    monophasic         +-------------------+--------+---------------+----------+--------+  +---------------+--------+--------+----------+--------+ PopA Dist      PSV cm/sStenosisWaveform  Comments +---------------+--------+--------+----------+--------+ Prox to Stent  144             monophasic         +---------------+--------+--------+----------+--------+ Proximal Stent 166             monophasic         +---------------+--------+--------+----------+--------+ Mid Stent       114             monophasic         +---------------+--------+--------+----------+--------+ Distal Stent   114             monophasic         +---------------+--------+--------+----------+--------+ Distal to Stent104             monophasic         +---------------+--------+--------+----------+--------+   Summary: Right: 50-74% stenosis noted in the superficial femoral artery and/or popliteal artery. 30-49% stenosis noted in the anterior tibial artery. Stenosis is noted within the 50-99% stenosis noted in the proximal SFA stent, otherwise SFA stent appears patent.  stent. No significant change compared to previous study.  See table(s) above for measurements and observations. Electronically signed by Heath Lark on 10/17/2022 at 8:55:07 PM.    Final    MR FOOT RIGHT W WO CONTRAST  Result Date: 10/16/2022 CLINICAL DATA:  Infection. EXAM: MRI OF THE RIGHT FOREFOOT WITHOUT AND WITH CONTRAST TECHNIQUE: Multiplanar, multisequence MR imaging of the right forefoot was performed before and after the administration of intravenous contrast. CONTRAST:  6mL GADAVIST GADOBUTROL 1 MMOL/ML IV SOLN COMPARISON:  MRI right forefoot from [facility] 07/05/2022 (images only; report unavailable); right foot radiographs 06/19/2022 and 07/04/2022 FINDINGS: Bones/Joint/Cartilage There is new absence of the distal 50% of the great toe metatarsal and great toe phalanges which is presumably secondary to interval amputation. There is high-grade edema and enhancement throughout the distal approximate 2 cm of the remaining first metatarsal with adjacent distal forefoot inflammatory phlegmon concerning for acute osteomyelitis. There is new moderate erosion of the second metatarsal head with scattered low signal foci throughout the metatarsal head which may represent foci of air (sagittal series 7, image 26). There is medial, proximal, and dorsal dislocation of the second toe phalanges with respect of the metatarsal head  (coronal series 5, image 12). There is mild-to-moderate erosion of portions of the base of the proximal phalanx of the second toe, the third metatarsal head, and the base of the proximal phalanx of the third  toe. There is moderate to high-grade marrow edema throughout the proximal 80% of the proximal phalanx of the second toe, the distal greater than proximal shaft of the second metatarsal diffusely, the distal 60% of the third metatarsal shaft, and the majority of the proximal phalanx of the third toe. Ligaments The Lisfranc ligament complex is grossly intact. Muscles and Tendons Postsurgical resection of the distal aspect of the flexor hallucis longus tendon. Diffuse moderate edema throughout the ventral and dorsal midfoot musculature, nonspecific myositis. Soft tissues There is an ulcer plantar to the second metatarsal head measuring up to 8 mm in transverse dimension, 7 mm in craniocaudal depth, and 9 mm along the longitudinal axis of the foot (axial image 23 and sagittal image 25). There also are connected pockets of decreased T1 and decreased T2 signal with blooming artifact dorsal to the second and third tarsometatarsal joints that appears to represent air extending into a large dorsal soft tissue wound (sagittal images 17 through 33 and axial images 18 through 33). This wound does connect to the dorsal skin at the level of the space between the dorsal base of the third and fourth toes (axial series 4, image 24). There are multiple areas of decreased T1 and increased T2 signal with rim enhancement abscesses within the soft tissues plantar medial to the distal first ray soft tissue amputation site, medial to the second metatarsophalangeal joint, and wrapping around the second metatarsophalangeal joint. IMPRESSION: 1. Interval amputation of the first ray to the mid metatarsal shaft. There is high-grade edema and enhancement throughout the distal approximate 2 cm of the remaining first metatarsal with adjacent  distal forefoot inflammatory phlegmon concerning for acute osteomyelitis. 2. There is new moderate erosion of the second metatarsal head with scattered low signal foci throughout the metatarsal head which may represent foci of air within the bone versus within wound packing material in the possible absence of the second metatarsal head. There is medial, proximal, and dorsal dislocation of the second toe phalanges with respect of the metatarsal head. 3. Findings concerning for acute osteomyelitis within the proximal 80% of the proximal phalanx of the second toe, the distal greater than proximal shaft of the second metatarsal diffusely, the distal 60% of the third metatarsal shaft, and the majority of the proximal phalanx of the third toe. These findings are concerning for acute osteomyelitis. 4. There is an ulcer plantar to the second metatarsal head. 5. Large dorsal forefoot wound at the level of the second and third distal metatarsals. 6. Multiple abscesses wrapping around the second metatarsophalangeal joint and within the distal first ray amputation site extending along the plantar aspect of the remaining first metatarsal. Electronically Signed   By: Neita Garnet M.D.   On: 10/16/2022 20:59     Discharge Exam: Vitals:   10/19/22 2004 10/20/22 0456  BP: (!) 138/90 (!) 142/98  Pulse: 83 90  Resp: 18 18  Temp: 98.3 F (36.8 C) 98.4 F (36.9 C)  SpO2: 99% 92%   Vitals:   10/19/22 1659 10/19/22 1700 10/19/22 2004 10/20/22 0456  BP: (!) 145/85  (!) 138/90 (!) 142/98  Pulse: 74  83 90  Resp: 18  18 18   Temp:   98.3 F (36.8 C) 98.4 F (36.9 C)  TempSrc:   Oral Oral  SpO2: (!) 86% 92% 99% 92%  Weight:    59.7 kg  Height:        General: Pt is alert, awake, not in acute distress Cardiovascular: RRR, S1/S2 +, no  rubs, no gallops Respiratory: CTA bilaterally, no wheezing, no rhonchi Abdominal: Soft, NT, ND, bowel sounds + Extremities: no edema, no cyanosis, has dressing in the right  foot.    The results of significant diagnostics from this hospitalization (including imaging, microbiology, ancillary and laboratory) are listed below for reference.     Microbiology: No results found for this or any previous visit (from the past 240 hour(s)).   Labs: BNP (last 3 results) Recent Labs    06/19/22 1906  BNP 487.0*   Basic Metabolic Panel: Recent Labs  Lab 10/16/22 1815 10/17/22 0603 10/17/22 0923 10/18/22 0041 10/19/22 0705 10/20/22 0406  NA 135  --  136 138 139 138  K 3.3*  --  3.6 3.3* 3.6 3.2*  CL 102  --  106 110 109 108  CO2 23  --  21* 17* 20* 19*  GLUCOSE 109*  --  95 93 106* 94  BUN 34*  --  20 18 14 11   CREATININE 1.38* 1.19 1.07 1.11 0.99 0.89  CALCIUM 8.7*  --  8.1* 8.0* 8.4* 8.1*  MG  --   --  1.8 1.8 1.8 1.8  PHOS  --   --  2.6 2.9 3.0 2.7   Liver Function Tests: Recent Labs  Lab 10/16/22 1815  AST 51*  ALT 48*  ALKPHOS 81  BILITOT 0.9  PROT 7.5  ALBUMIN 3.2*   No results for input(s): "LIPASE", "AMYLASE" in the last 168 hours. No results for input(s): "AMMONIA" in the last 168 hours. CBC: Recent Labs  Lab 10/16/22 1815 10/17/22 0923 10/18/22 0041 10/19/22 0901 10/20/22 0406  WBC 9.4 8.4 7.9 7.8 6.5  NEUTROABS 7.4  --   --   --   --   HGB 11.3* 10.5* 10.2* 12.2* 12.2*  HCT 34.7* 32.3* 31.5* 37.1* 37.8*  MCV 88.5 86.4 87.0 86.9 86.5  PLT 252 232 244 300 325   Cardiac Enzymes: No results for input(s): "CKTOTAL", "CKMB", "CKMBINDEX", "TROPONINI" in the last 168 hours. BNP: Invalid input(s): "POCBNP" CBG: No results for input(s): "GLUCAP" in the last 168 hours. D-Dimer No results for input(s): "DDIMER" in the last 72 hours. Hgb A1c No results for input(s): "HGBA1C" in the last 72 hours. Lipid Profile No results for input(s): "CHOL", "HDL", "LDLCALC", "TRIG", "CHOLHDL", "LDLDIRECT" in the last 72 hours. Thyroid function studies No results for input(s): "TSH", "T4TOTAL", "T3FREE", "THYROIDAB" in the last 72  hours.  Invalid input(s): "FREET3" Anemia work up No results for input(s): "VITAMINB12", "FOLATE", "FERRITIN", "TIBC", "IRON", "RETICCTPCT" in the last 72 hours. Urinalysis    Component Value Date/Time   COLORURINE YELLOW 06/19/2022 1714   APPEARANCEUR HAZY (A) 06/19/2022 1714   LABSPEC 1.019 06/19/2022 1714   PHURINE 5.0 06/19/2022 1714   GLUCOSEU NEGATIVE 06/19/2022 1714   HGBUR NEGATIVE 06/19/2022 1714   BILIRUBINUR NEGATIVE 06/19/2022 1714   KETONESUR 5 (A) 06/19/2022 1714   PROTEINUR 30 (A) 06/19/2022 1714   UROBILINOGEN 0.2 10/22/2012 1312   NITRITE NEGATIVE 06/19/2022 1714   LEUKOCYTESUR NEGATIVE 06/19/2022 1714   Sepsis Labs Recent Labs  Lab 10/17/22 0923 10/18/22 0041 10/19/22 0901 10/20/22 0406  WBC 8.4 7.9 7.8 6.5   Microbiology No results found for this or any previous visit (from the past 240 hour(s)).  FURTHER DISCHARGE INSTRUCTIONS:   Get Medicines reviewed and adjusted: Please take all your medications with you for your next visit with your Primary MD   Laboratory/radiological data: Please request your Primary MD to go over all hospital tests  and procedure/radiological results at the follow up, please ask your Primary MD to get all Hospital records sent to his/her office.   In some cases, they will be blood work, cultures and biopsy results pending at the time of your discharge. Please request that your primary care M.D. goes through all the records of your hospital data and follows up on these results.   Also Note the following: If you experience worsening of your admission symptoms, develop shortness of breath, life threatening emergency, suicidal or homicidal thoughts you must seek medical attention immediately by calling 911 or calling your MD immediately  if symptoms less severe.   You must read complete instructions/literature along with all the possible adverse reactions/side effects for all the Medicines you take and that have been prescribed to  you. Take any new Medicines after you have completely understood and accpet all the possible adverse reactions/side effects.    Do not drive when taking Pain medications or sleeping medications (Benzodaizepines)   Do not take more than prescribed Pain, Sleep and Anxiety Medications. It is not advisable to combine anxiety,sleep and pain medications without talking with your primary care practitioner   Special Instructions: If you have smoked or chewed Tobacco  in the last 2 yrs please stop smoking, stop any regular Alcohol  and or any Recreational drug use.   Wear Seat belts while driving.   Please note: You were cared for by a hospitalist during your hospital stay. Once you are discharged, your primary care physician will handle any further medical issues. Please note that NO REFILLS for any discharge medications will be authorized once you are discharged, as it is imperative that you return to your primary care physician (or establish a relationship with a primary care physician if you do not have one) for your post hospital discharge needs so that they can reassess your need for medications and monitor your lab values  Time coordinating discharge: Over 30 minutes  SIGNED:   Hughie Closs, MD  [facility] 10/20/2022, 11:38 AM *Please note that this is a verbal dictation therefore any spelling or grammatical errors are due to the "[employer] One" system interpretation. If 7PM-7AM, please contact night-coverage [email or web address]

## 2022-10-20 NOTE — Plan of Care (Signed)
  Problem: Education: Goal: Knowledge of General Education information will improve Description: Including pain rating scale, medication(s)/side effects and non-pharmacologic comfort measures 10/20/2022 0335 by Arlyss Repress, RN Outcome: Progressing 10/20/2022 0335 by Arlyss Repress, RN Outcome: Progressing   Problem: Health Behavior/Discharge Planning: Goal: Ability to manage health-related needs will improve 10/20/2022 0335 by Arlyss Repress, RN Outcome: Progressing 10/20/2022 0335 by Arlyss Repress, RN Outcome: Progressing   Problem: Clinical Measurements: Goal: Ability to maintain clinical measurements within normal limits will improve 10/20/2022 0335 by Arlyss Repress, RN Outcome: Progressing 10/20/2022 0335 by Arlyss Repress, RN Outcome: Progressing Goal: Will remain free from infection 10/20/2022 0335 by Arlyss Repress, RN Outcome: Progressing 10/20/2022 0335 by Arlyss Repress, RN Outcome: Progressing Goal: Diagnostic test results will improve 10/20/2022 0335 by Arlyss Repress, RN Outcome: Progressing 10/20/2022 0335 by Arlyss Repress, RN Outcome: Progressing Goal: Respiratory complications will improve 10/20/2022 0335 by Arlyss Repress, RN Outcome: Progressing 10/20/2022 0335 by Arlyss Repress, RN Outcome: Progressing Goal: Cardiovascular complication will be avoided 10/20/2022 0335 by Arlyss Repress, RN Outcome: Progressing 10/20/2022 0335 by Arlyss Repress, RN Outcome: Progressing   Problem: Activity: Goal: Risk for activity intolerance will decrease 10/20/2022 0335 by Arlyss Repress, RN Outcome: Progressing 10/20/2022 0335 by Arlyss Repress, RN Outcome: Progressing   Problem: Nutrition: Goal: Adequate nutrition will be maintained 10/20/2022 0335 by Arlyss Repress, RN Outcome: Progressing 10/20/2022 0335 by Arlyss Repress, RN Outcome: Progressing   Problem: Coping: Goal: Level of anxiety will decrease 10/20/2022 0335 by Arlyss Repress, RN Outcome:  Progressing 10/20/2022 0335 by Arlyss Repress, RN Outcome: Progressing   Problem: Elimination: Goal: Will not experience complications related to bowel motility 10/20/2022 0335 by Arlyss Repress, RN Outcome: Progressing 10/20/2022 0335 by Arlyss Repress, RN Outcome: Progressing Goal: Will not experience complications related to urinary retention 10/20/2022 0335 by Arlyss Repress, RN Outcome: Progressing 10/20/2022 0335 by Arlyss Repress, RN Outcome: Progressing   Problem: Pain Managment: Goal: General experience of comfort will improve 10/20/2022 0335 by Arlyss Repress, RN Outcome: Progressing 10/20/2022 0335 by Arlyss Repress, RN Outcome: Progressing   Problem: Safety: Goal: Ability to remain free from injury will improve 10/20/2022 0335 by Arlyss Repress, RN Outcome: Progressing 10/20/2022 0335 by Arlyss Repress, RN Outcome: Progressing   Problem: Skin Integrity: Goal: Risk for impaired skin integrity will decrease 10/20/2022 0335 by Arlyss Repress, RN Outcome: Progressing 10/20/2022 0335 by Arlyss Repress, RN Outcome: Progressing

## 2022-10-20 NOTE — Progress Notes (Signed)
Discharge completed by Leonie Man RN. See nurse's note for details.

## 2022-10-20 NOTE — Progress Notes (Signed)
Discharge instructions reviewed with pt's daughter and son n law, pt present as well. Daughter verbalizes understanding of instructions. Daughter asked about medication to help with pt "sundowning", as he was getting something here at hospital. Secure chat message sent to MD to ask about this.  Copy of instructions given to pt. Informed of one new script for antibiotic sent to pt's pharmacy for pick up.  Family assisting getting pt dressed at this time.  MD responded and medications added to discharge instructions and scripts sent to pt's pharmacy, will inform daughter and provide new AVS.   Pt to be d/c'd via wheelchair with belongings, with family.             To be escorted by staff.  Annice Needy, RN SWOT

## 2022-11-28 DIAGNOSIS — I739 Peripheral vascular disease, unspecified: Secondary | ICD-10-CM | POA: Diagnosis not present

## 2022-11-28 DIAGNOSIS — I10 Essential (primary) hypertension: Secondary | ICD-10-CM | POA: Diagnosis not present

## 2022-11-28 DIAGNOSIS — G309 Alzheimer's disease, unspecified: Secondary | ICD-10-CM | POA: Diagnosis not present

## 2022-12-12 DIAGNOSIS — M25552 Pain in left hip: Secondary | ICD-10-CM | POA: Diagnosis not present

## 2022-12-28 DIAGNOSIS — G309 Alzheimer's disease, unspecified: Secondary | ICD-10-CM | POA: Diagnosis not present

## 2022-12-28 DIAGNOSIS — I739 Peripheral vascular disease, unspecified: Secondary | ICD-10-CM | POA: Diagnosis not present

## 2022-12-28 DIAGNOSIS — I10 Essential (primary) hypertension: Secondary | ICD-10-CM | POA: Diagnosis not present

## 2023-01-08 DIAGNOSIS — L03115 Cellulitis of right lower limb: Secondary | ICD-10-CM | POA: Diagnosis not present

## 2023-01-08 DIAGNOSIS — D500 Iron deficiency anemia secondary to blood loss (chronic): Secondary | ICD-10-CM | POA: Diagnosis not present

## 2023-01-08 DIAGNOSIS — I10 Essential (primary) hypertension: Secondary | ICD-10-CM | POA: Diagnosis not present

## 2023-01-08 DIAGNOSIS — I739 Peripheral vascular disease, unspecified: Secondary | ICD-10-CM | POA: Diagnosis not present

## 2023-01-08 DIAGNOSIS — Z8673 Personal history of transient ischemic attack (TIA), and cerebral infarction without residual deficits: Secondary | ICD-10-CM | POA: Diagnosis not present

## 2023-01-08 DIAGNOSIS — M869 Osteomyelitis, unspecified: Secondary | ICD-10-CM | POA: Diagnosis not present

## 2023-01-08 DIAGNOSIS — E785 Hyperlipidemia, unspecified: Secondary | ICD-10-CM | POA: Diagnosis not present

## 2023-02-02 DIAGNOSIS — R509 Fever, unspecified: Secondary | ICD-10-CM | POA: Diagnosis not present

## 2023-02-02 DIAGNOSIS — N189 Chronic kidney disease, unspecified: Secondary | ICD-10-CM | POA: Diagnosis not present

## 2023-02-02 DIAGNOSIS — A419 Sepsis, unspecified organism: Secondary | ICD-10-CM | POA: Diagnosis not present

## 2023-02-02 DIAGNOSIS — R059 Cough, unspecified: Secondary | ICD-10-CM | POA: Diagnosis not present

## 2023-02-02 DIAGNOSIS — E785 Hyperlipidemia, unspecified: Secondary | ICD-10-CM | POA: Diagnosis not present

## 2023-02-02 DIAGNOSIS — U071 COVID-19: Secondary | ICD-10-CM | POA: Diagnosis not present

## 2023-02-02 DIAGNOSIS — I10 Essential (primary) hypertension: Secondary | ICD-10-CM | POA: Diagnosis not present

## 2023-02-02 DIAGNOSIS — R918 Other nonspecific abnormal finding of lung field: Secondary | ICD-10-CM | POA: Diagnosis not present

## 2023-02-02 DIAGNOSIS — A4189 Other specified sepsis: Secondary | ICD-10-CM | POA: Diagnosis not present

## 2023-02-02 DIAGNOSIS — N179 Acute kidney failure, unspecified: Secondary | ICD-10-CM | POA: Diagnosis not present

## 2023-02-03 DIAGNOSIS — A419 Sepsis, unspecified organism: Secondary | ICD-10-CM | POA: Diagnosis not present

## 2023-02-03 DIAGNOSIS — E870 Hyperosmolality and hypernatremia: Secondary | ICD-10-CM | POA: Diagnosis not present

## 2023-02-03 DIAGNOSIS — N179 Acute kidney failure, unspecified: Secondary | ICD-10-CM | POA: Diagnosis not present

## 2023-02-03 DIAGNOSIS — D509 Iron deficiency anemia, unspecified: Secondary | ICD-10-CM | POA: Diagnosis not present

## 2023-02-03 DIAGNOSIS — Z7982 Long term (current) use of aspirin: Secondary | ICD-10-CM | POA: Diagnosis not present

## 2023-02-03 DIAGNOSIS — N189 Chronic kidney disease, unspecified: Secondary | ICD-10-CM | POA: Diagnosis not present

## 2023-02-03 DIAGNOSIS — G459 Transient cerebral ischemic attack, unspecified: Secondary | ICD-10-CM | POA: Diagnosis not present

## 2023-02-03 DIAGNOSIS — Z792 Long term (current) use of antibiotics: Secondary | ICD-10-CM | POA: Diagnosis not present

## 2023-02-03 DIAGNOSIS — Z79899 Other long term (current) drug therapy: Secondary | ICD-10-CM | POA: Diagnosis not present

## 2023-02-04 DIAGNOSIS — A419 Sepsis, unspecified organism: Secondary | ICD-10-CM | POA: Diagnosis not present

## 2023-02-04 DIAGNOSIS — N189 Chronic kidney disease, unspecified: Secondary | ICD-10-CM | POA: Diagnosis not present

## 2023-02-04 DIAGNOSIS — Z8616 Personal history of COVID-19: Secondary | ICD-10-CM | POA: Diagnosis not present

## 2023-02-04 DIAGNOSIS — G459 Transient cerebral ischemic attack, unspecified: Secondary | ICD-10-CM | POA: Diagnosis not present

## 2023-02-04 DIAGNOSIS — E870 Hyperosmolality and hypernatremia: Secondary | ICD-10-CM | POA: Diagnosis not present

## 2023-02-04 DIAGNOSIS — Z79899 Other long term (current) drug therapy: Secondary | ICD-10-CM | POA: Diagnosis not present

## 2023-02-04 DIAGNOSIS — Z792 Long term (current) use of antibiotics: Secondary | ICD-10-CM | POA: Diagnosis not present

## 2023-02-04 DIAGNOSIS — N179 Acute kidney failure, unspecified: Secondary | ICD-10-CM | POA: Diagnosis not present

## 2023-02-04 DIAGNOSIS — E611 Iron deficiency: Secondary | ICD-10-CM | POA: Diagnosis not present

## 2023-02-27 DEATH — deceased

## 2023-03-06 ENCOUNTER — Ambulatory Visit: Payer: Medicare Other

## 2023-03-06 ENCOUNTER — Encounter (HOSPITAL_COMMUNITY): Payer: Medicare Other
